# Patient Record
Sex: Female | Born: 1965 | Race: White | Hispanic: No | Marital: Married | Smoking: Never smoker
Health system: Southern US, Community
[De-identification: ages and names within clinical notes are randomized; demographics above are authoritative.]

## PROBLEM LIST (undated history)

## (undated) DIAGNOSIS — Z5189 Encounter for other specified aftercare: Secondary | ICD-10-CM

## (undated) DIAGNOSIS — R51 Headache: Secondary | ICD-10-CM

## (undated) DIAGNOSIS — T7840XA Allergy, unspecified, initial encounter: Secondary | ICD-10-CM

## (undated) DIAGNOSIS — F909 Attention-deficit hyperactivity disorder, unspecified type: Secondary | ICD-10-CM

## (undated) DIAGNOSIS — I82409 Acute embolism and thrombosis of unspecified deep veins of unspecified lower extremity: Secondary | ICD-10-CM

## (undated) DIAGNOSIS — S0300XA Dislocation of jaw, unspecified side, initial encounter: Secondary | ICD-10-CM

## (undated) DIAGNOSIS — R112 Nausea with vomiting, unspecified: Secondary | ICD-10-CM

## (undated) DIAGNOSIS — M1990 Unspecified osteoarthritis, unspecified site: Secondary | ICD-10-CM

## (undated) DIAGNOSIS — Z9889 Other specified postprocedural states: Secondary | ICD-10-CM

## (undated) HISTORY — PX: CHOLECYSTECTOMY: SHX55

## (undated) HISTORY — DX: Unspecified osteoarthritis, unspecified site: M19.90

## (undated) HISTORY — DX: Acute embolism and thrombosis of unspecified deep veins of unspecified lower extremity: I82.409

## (undated) HISTORY — PX: EYE SURGERY: SHX253

## (undated) HISTORY — DX: Allergy, unspecified, initial encounter: T78.40XA

## (undated) HISTORY — DX: Encounter for other specified aftercare: Z51.89

---

## 1997-11-19 HISTORY — PX: OTHER SURGICAL HISTORY: SHX169

## 1997-11-27 DIAGNOSIS — O901 Disruption of perineal obstetric wound: Secondary | ICD-10-CM | POA: Insufficient documentation

## 1997-11-29 HISTORY — PX: OTHER SURGICAL HISTORY: SHX169

## 2011-11-26 ENCOUNTER — Ambulatory Visit (INDEPENDENT_AMBULATORY_CARE_PROVIDER_SITE_OTHER): Payer: 59

## 2011-11-26 DIAGNOSIS — Z719 Counseling, unspecified: Secondary | ICD-10-CM

## 2011-11-26 DIAGNOSIS — F988 Other specified behavioral and emotional disorders with onset usually occurring in childhood and adolescence: Secondary | ICD-10-CM

## 2012-02-25 ENCOUNTER — Ambulatory Visit: Payer: 59

## 2012-02-25 ENCOUNTER — Ambulatory Visit (INDEPENDENT_AMBULATORY_CARE_PROVIDER_SITE_OTHER): Payer: 59 | Admitting: Family Medicine

## 2012-02-25 VITALS — BP 123/84 | HR 78 | Temp 98.2°F | Resp 16 | Ht 60.5 in | Wt 108.0 lb

## 2012-02-25 DIAGNOSIS — F988 Other specified behavioral and emotional disorders with onset usually occurring in childhood and adolescence: Secondary | ICD-10-CM

## 2012-02-25 DIAGNOSIS — F909 Attention-deficit hyperactivity disorder, unspecified type: Secondary | ICD-10-CM

## 2012-02-25 DIAGNOSIS — M5430 Sciatica, unspecified side: Secondary | ICD-10-CM

## 2012-02-25 DIAGNOSIS — M542 Cervicalgia: Secondary | ICD-10-CM

## 2012-02-25 DIAGNOSIS — R634 Abnormal weight loss: Secondary | ICD-10-CM

## 2012-02-25 DIAGNOSIS — Z139 Encounter for screening, unspecified: Secondary | ICD-10-CM

## 2012-02-25 LAB — POCT CBC
Lymph, poc: 2.6 (ref 0.6–3.4)
MCH, POC: 29.3 pg (ref 27–31.2)
MCHC: 32.9 g/dL (ref 31.8–35.4)
MCV: 89 fL (ref 80–97)
MID (cbc): 0.5 (ref 0–0.9)
MPV: 10.1 fL (ref 0–99.8)
POC LYMPH PERCENT: 37.3 %L (ref 10–50)
POC MID %: 7.1 %M (ref 0–12)
Platelet Count, POC: 223 10*3/uL (ref 142–424)
RDW, POC: 13.5 %
WBC: 7.1 10*3/uL (ref 4.6–10.2)

## 2012-02-25 LAB — TSH: TSH: 3.098 u[IU]/mL (ref 0.350–4.500)

## 2012-02-25 MED ORDER — PREDNISONE 20 MG PO TABS: ORAL_TABLET | ORAL | Status: AC

## 2012-02-25 MED ORDER — AMPHETAMINE-DEXTROAMPHETAMINE 20 MG PO TABS: 20.0000 mg | ORAL_TABLET | Freq: Two times a day (BID) | ORAL | Status: DC

## 2012-02-25 MED ORDER — CYCLOBENZAPRINE HCL 10 MG PO TABS: 10.0000 mg | ORAL_TABLET | Freq: Two times a day (BID) | ORAL | Status: AC | PRN

## 2012-02-25 NOTE — Progress Notes (Signed)
Patient Name: Tiffany Parker Date of Birth: Apr 28, 1966 Medical Record Number: [id number] Gender: female Date of Encounter: 02/25/2012  History of Present Illness:  Tiffany Parker is a 46 y.o. very pleasant female patient who presents with the following:  Here to recheck her ADD.  Has been on adderall 20 BID for the last several months. She takes one at 6 or 7 am, the other around 1pm.  Feels that she is losing her concentration in the later afternoon.  She will be finishing her school program but will be doing a lot of studying- math- this summer.  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   She also has had neck problems for several years.  She notes that her neck seems more stiff and sore for the last several months.  She has been diagnosed with degenerative changes but never had an MRI.  Her neck is getting so bad that it makes it difficult for her to turn her head while driving.  She wants to do something about it at this point. Also notes left sciatic nerve pain for the last 6 weeks or so.  Worse with exercise, no numbness or weakness.  Pain occurs in her right buttock and goes into the top part of her right leg  There is no problem list on file for this patient.  Past Medical History  Diagnosis Date  . Allergy   . Blood transfusion    Past Surgical History  Procedure Date  . Cholecystectomy   . Cesarean section   . Episiotomy  11/19/97  . Episiotomy repair 11/1997   History  Substance Use Topics  . Smoking status: Never Smoker   . Smokeless tobacco: Not on file  . Alcohol Use: No   Family History  Problem Relation Age of Onset  . Cancer Mother   . Hypertension Father    Allergies  Allergen Reactions  . Codeine Nausea Only  . Septra (Bactrim) Hives    Medication list has been reviewed and updated.  Review of Systems: As per HPI- otherwise negative.   Physical Examination: Filed Vitals:   02/25/12 0941  BP: 123/84  Pulse: 78  Temp: 98.2 F (36.8 C)    TempSrc: Oral  Resp: 16  Height: 5' 0.5" (1.537 m)  Weight: 108 lb (48.988 kg)    Body mass index is 20.74 kg/(m^2).  GEN: WDWN, NAD, Non-toxic, A & O x 3 HEENT: Atraumatic, Normocephalic. Neck supple. No masses, No LAD.  TM, oropharynx wnl Ears and Nose: No external deformity. CV: RRR, No M/G/R. No JVD. No thrill. No extra heart sounds. PULM: CTA B, no wheezes, crackles, rhonchi. No retractions. No resp. distress. No accessory muscle use. ABD: S, NT, ND, +BS. No rebound. No HSM. EXTR: No c/c/e NEURO Normal gait.  PSYCH: Normally interactive. Conversant. Not depressed or anxious appearing.  Calm demeanor.  Neck: cervical motion is slightly restricted with lateal rotation.  Good extension/ flexion, no bony tenderness.   Straight leg raise on left causes pain to increase in her right buttock, but no radiation of pain or paresthesias.  No weakness or numbness  [facility] reading (PRIMARY) by  Dr. Patsy Lager.  Cervical spine with significant degenerative change especially at C6 to C7 CERVICAL SPINE - COMPLETE 4+ VIEW  Comparison: None.  Findings: No prevertebral soft tissue swelling is evident. There is narrowing of some of the intervertebral disc with most promptly at the level of C6- C7. There is marginal osteophyte formation at multiple  levels representing degenerative spondylosis. There is posterior bony ridging at the level of C6-C7. There is slight posterior subluxation of the body of C4 on the body of C5. No fracture or bony destruction is evident. There is foraminal encroachment by spurring at the level of C6-C7 bilaterally. No cervical ribs were evident.  IMPRESSION: Changes of degenerative disc disease and degenerative spondylosis. Slight posterior subluxation of body of C4 on body of C5. See above for details.  Assessment and Plan: 1. ADD (attention deficit disorder with hyperactivity)  amphetamine-dextroamphetamine (ADDERALL) 20 MG tablet  2. Neck pain  DG Cervical Spine  Complete, predniSONE (DELTASONE) 20 MG tablet, cyclobenzaprine (FLEXERIL) 10 MG tablet  3. Sciatica  predniSONE (DELTASONE) 20 MG tablet, cyclobenzaprine (FLEXERIL) 10 MG tablet  4. Screening  Lipid panel  5. Weight loss  POCT CBC, Comprehensive metabolic panel, TSH   Will continue to use adderall 20 BID- explained that increasing her dose further will probably not be helpful.  Suggest that instead we adjust her dosing schedule- she may take 20 in the morning, 10 at noon and 10 in the early afternoon.  If this is not helpful please let me know.  Otherwise may refill 3 months of medication in July, recheck in 6 months.    Will try a short course of prednisone for neck pain and sciatica.  Also flexeril as needed.  Referral to neurosurgery for her neck  Check labs as above- she is fasting and it has been a year since her last labs Wishes to see Dr. Wynetta Emery for her neck problem

## 2012-02-26 ENCOUNTER — Encounter: Payer: Self-pay | Admitting: Family Medicine

## 2012-02-26 LAB — COMPREHENSIVE METABOLIC PANEL
ALT: 21 U/L (ref 0–35)
AST: 20 U/L (ref 0–37)
BUN: 10 mg/dL (ref 6–23)
Calcium: 9.5 mg/dL (ref 8.4–10.5)
Creat: 0.57 mg/dL (ref 0.50–1.10)
Total Bilirubin: 0.6 mg/dL (ref 0.3–1.2)

## 2012-02-26 LAB — LIPID PANEL
HDL: 81 mg/dL (ref >39)
Total CHOL/HDL Ratio: 2.1 Ratio
VLDL: 10 mg/dL (ref 0–40)

## 2012-03-07 ENCOUNTER — Other Ambulatory Visit (HOSPITAL_COMMUNITY): Payer: Self-pay | Admitting: Neurosurgery

## 2012-03-07 ENCOUNTER — Ambulatory Visit (HOSPITAL_COMMUNITY)
Admission: RE | Admit: 2012-03-07 | Discharge: 2012-03-07 | Disposition: A | Discharge status: Home / Self Care | Payer: 59 | Source: Ambulatory Visit | Attending: Neurosurgery | Admitting: Neurosurgery

## 2012-03-07 DIAGNOSIS — R209 Unspecified disturbances of skin sensation: Secondary | ICD-10-CM | POA: Insufficient documentation

## 2012-03-07 DIAGNOSIS — M47812 Spondylosis without myelopathy or radiculopathy, cervical region: Secondary | ICD-10-CM | POA: Insufficient documentation

## 2012-03-13 ENCOUNTER — Other Ambulatory Visit: Payer: Self-pay | Admitting: Neurosurgery

## 2012-04-24 ENCOUNTER — Encounter (HOSPITAL_COMMUNITY): Payer: Self-pay | Admitting: Pharmacy Technician

## 2012-04-24 ENCOUNTER — Encounter (HOSPITAL_COMMUNITY): Payer: Self-pay | Admitting: *Deleted

## 2012-04-24 NOTE — Pre-Procedure Instructions (Signed)
20 ANDA SOBOTTA  04/24/2012   Your procedure is scheduled on:  July 8th  Report to [facility] at 0530 AM.  Call this number if you have problems the morning of surgery: [phone]   Remember:   Do not eat food or drink:After Midnight.  Take these medicines the morning of surgery with A SIP OF WATER: adderall   Do not wear jewelry, make-up or nail polish.  Do not wear lotions, powders, or perfumes.   Do not shave 48 hours prior to surgery. Men may shave face and neck.  Do not bring valuables to the hospital.  Contacts, dentures or bridgework may not be worn into surgery.  Leave suitcase in the car. After surgery it may be brought to your room.  For patients admitted to the hospital, checkout time is 11:00 AM the day of discharge.   Patients discharged the day of surgery will not be allowed to drive home.  Special Instructions: CHG Shower Use Special Wash: 1/2 bottle night before surgery and 1/2 bottle morning of surgery.   Please read over the following fact sheets that you were given: Pain Booklet, Coughing and Deep Breathing, MRSA Information and Surgical Site Infection Prevention

## 2012-04-28 ENCOUNTER — Encounter (HOSPITAL_COMMUNITY)
Admission: RE | Admit: 2012-04-28 | Discharge: 2012-04-28 | Disposition: A | Discharge status: Home / Self Care | Payer: 59 | Source: Ambulatory Visit | Attending: Neurosurgery | Admitting: Neurosurgery

## 2012-04-28 ENCOUNTER — Encounter (HOSPITAL_COMMUNITY): Payer: Self-pay

## 2012-04-28 ENCOUNTER — Other Ambulatory Visit (HOSPITAL_COMMUNITY): Payer: 59

## 2012-04-28 DIAGNOSIS — I82409 Acute embolism and thrombosis of unspecified deep veins of unspecified lower extremity: Secondary | ICD-10-CM

## 2012-04-28 HISTORY — DX: Acute embolism and thrombosis of unspecified deep veins of unspecified lower extremity: I82.409

## 2012-04-28 HISTORY — DX: Nausea with vomiting, unspecified: R11.2

## 2012-04-28 HISTORY — DX: Other specified postprocedural states: Z98.890

## 2012-04-28 LAB — SURGICAL PCR SCREEN: MRSA, PCR: NEGATIVE

## 2012-04-28 LAB — CBC
HCT: 40.6 % (ref 36.0–46.0)
Hemoglobin: 14 g/dL (ref 12.0–15.0)
MCH: 29.6 pg (ref 26.0–34.0)
MCHC: 34.5 g/dL (ref 30.0–36.0)
MCV: 85.8 fL (ref 78.0–100.0)
RDW: 12.6 % (ref 11.5–15.5)

## 2012-05-04 MED ORDER — CEFAZOLIN SODIUM-DEXTROSE 2-3 GM-% IV SOLR: 2.0000 g | INTRAVENOUS | Status: DC

## 2012-05-05 ENCOUNTER — Ambulatory Visit (HOSPITAL_COMMUNITY): Payer: 59

## 2012-05-05 ENCOUNTER — Ambulatory Visit (HOSPITAL_COMMUNITY)
Admission: RE | Admit: 2012-05-05 | Discharge: 2012-05-06 | Disposition: A | Discharge status: Home / Self Care | Payer: 59 | Source: Ambulatory Visit | Attending: Neurosurgery | Admitting: Neurosurgery

## 2012-05-05 ENCOUNTER — Encounter (HOSPITAL_COMMUNITY): Payer: Self-pay | Admitting: Anesthesiology

## 2012-05-05 ENCOUNTER — Encounter (HOSPITAL_COMMUNITY): Payer: Self-pay | Admitting: Surgery

## 2012-05-05 ENCOUNTER — Ambulatory Visit (HOSPITAL_COMMUNITY): Payer: 59 | Admitting: Anesthesiology

## 2012-05-05 ENCOUNTER — Encounter (HOSPITAL_COMMUNITY)
Admission: RE | Disposition: A | Discharge status: Home / Self Care | Payer: Self-pay | Source: Ambulatory Visit | Attending: Neurosurgery

## 2012-05-05 DIAGNOSIS — Z01812 Encounter for preprocedural laboratory examination: Secondary | ICD-10-CM | POA: Insufficient documentation

## 2012-05-05 DIAGNOSIS — M4712 Other spondylosis with myelopathy, cervical region: Secondary | ICD-10-CM | POA: Insufficient documentation

## 2012-05-05 HISTORY — DX: Dislocation of jaw, unspecified side, initial encounter: S03.00XA

## 2012-05-05 HISTORY — PX: ANTERIOR CERVICAL DECOMP/DISCECTOMY FUSION: SHX1161

## 2012-05-05 HISTORY — DX: Attention-deficit hyperactivity disorder, unspecified type: F90.9

## 2012-05-05 SURGERY — ANTERIOR CERVICAL DECOMPRESSION/DISCECTOMY FUSION 1 LEVEL
Anesthesia: General | Site: Spine Cervical | Wound class: Clean

## 2012-05-05 MED ORDER — OLOPATADINE HCL 0.1 % OP SOLN
1.0000 [drp] | OPHTHALMIC | Status: DC | PRN
  Filled 2012-05-05: qty 5

## 2012-05-05 MED ORDER — ACETAMINOPHEN 325 MG PO TABS: 650.0000 mg | ORAL_TABLET | ORAL | Status: DC | PRN

## 2012-05-05 MED ORDER — THROMBIN 5000 UNITS EX KIT
PACK | CUTANEOUS | Status: DC | PRN
  Administered 2012-05-05 (×2): 5000 [IU] via TOPICAL

## 2012-05-05 MED ORDER — MIDAZOLAM HCL 5 MG/5ML IJ SOLN
INTRAMUSCULAR | Status: DC | PRN
  Administered 2012-05-05: 2 mg via INTRAVENOUS

## 2012-05-05 MED ORDER — HYDROMORPHONE HCL PF 1 MG/ML IJ SOLN
0.5000 mg | INTRAMUSCULAR | Status: DC | PRN
  Administered 2012-05-05: 1 mg via INTRAVENOUS
  Filled 2012-05-05: qty 1

## 2012-05-05 MED ORDER — LACTATED RINGERS IV SOLN
INTRAVENOUS | Status: DC | PRN
  Administered 2012-05-05 (×2): via INTRAVENOUS

## 2012-05-05 MED ORDER — BACITRACIN 50000 UNITS IM SOLR
INTRAMUSCULAR | Status: AC
  Filled 2012-05-05: qty 1

## 2012-05-05 MED ORDER — HYDROMORPHONE HCL PF 1 MG/ML IJ SOLN
0.2500 mg | INTRAMUSCULAR | Status: DC | PRN
  Administered 2012-05-05 (×3): 0.5 mg via INTRAVENOUS

## 2012-05-05 MED ORDER — SODIUM CHLORIDE 0.9 % IV SOLN
250.0000 mL | INTRAVENOUS | Status: DC
  Administered 2012-05-05 (×2): 250 mL via INTRAVENOUS

## 2012-05-05 MED ORDER — THROMBIN 5000 UNITS EX SOLR
CUTANEOUS | Status: DC | PRN
  Administered 2012-05-05: 09:00:00 via TOPICAL

## 2012-05-05 MED ORDER — 0.9 % SODIUM CHLORIDE (POUR BTL) OPTIME
TOPICAL | Status: DC | PRN
  Administered 2012-05-05: 1000 mL

## 2012-05-05 MED ORDER — DEXAMETHASONE SODIUM PHOSPHATE 10 MG/ML IJ SOLN
INTRAMUSCULAR | Status: DC | PRN
  Administered 2012-05-05: 10 mg via INTRAVENOUS

## 2012-05-05 MED ORDER — OLOPATADINE HCL 0.2 % OP SOLN: 1.0000 [drp] | OPHTHALMIC | Status: DC | PRN

## 2012-05-05 MED ORDER — POLYETHYLENE GLYCOL 3350 17 G PO PACK
17.0000 g | PACK | Freq: Every day | ORAL | Status: DC
  Filled 2012-05-05 (×2): qty 1

## 2012-05-05 MED ORDER — SODIUM CHLORIDE 0.9 % IV SOLN
INTRAVENOUS | Status: AC
  Filled 2012-05-05: qty 500

## 2012-05-05 MED ORDER — OXYCODONE-ACETAMINOPHEN 5-325 MG PO TABS
2.0000 | ORAL_TABLET | ORAL | Status: DC | PRN
  Administered 2012-05-05: 1 via ORAL
  Administered 2012-05-05 – 2012-05-06 (×2): 2 via ORAL
  Administered 2012-05-06: 1 via ORAL
  Filled 2012-05-05: qty 1
  Filled 2012-05-05: qty 2
  Filled 2012-05-05: qty 1
  Filled 2012-05-05: qty 2

## 2012-05-05 MED ORDER — LIDOCAINE HCL (CARDIAC) 20 MG/ML IV SOLN
INTRAVENOUS | Status: DC | PRN
  Administered 2012-05-05: 40 mg via INTRAVENOUS

## 2012-05-05 MED ORDER — SODIUM CHLORIDE 0.9 % IJ SOLN
3.0000 mL | Freq: Two times a day (BID) | INTRAMUSCULAR | Status: DC
  Administered 2012-05-05: 3 mL via INTRAVENOUS

## 2012-05-05 MED ORDER — ONDANSETRON HCL 4 MG/2ML IJ SOLN
INTRAMUSCULAR | Status: DC | PRN
  Administered 2012-05-05: 4 mg via INTRAVENOUS

## 2012-05-05 MED ORDER — ONDANSETRON HCL 4 MG/2ML IJ SOLN
INTRAMUSCULAR | Status: AC
  Filled 2012-05-05: qty 2

## 2012-05-05 MED ORDER — BACITRACIN 50000 UNITS IM SOLR
INTRAMUSCULAR | Status: DC | PRN
  Administered 2012-05-05: 09:00:00

## 2012-05-05 MED ORDER — HYDROMORPHONE HCL PF 1 MG/ML IJ SOLN
INTRAMUSCULAR | Status: AC
  Filled 2012-05-05: qty 1

## 2012-05-05 MED ORDER — CYCLOBENZAPRINE HCL 10 MG PO TABS
10.0000 mg | ORAL_TABLET | Freq: Three times a day (TID) | ORAL | Status: DC | PRN
  Administered 2012-05-05 – 2012-05-06 (×2): 10 mg via ORAL
  Filled 2012-05-05 (×2): qty 1

## 2012-05-05 MED ORDER — CEFAZOLIN SODIUM-DEXTROSE 2-3 GM-% IV SOLR
INTRAVENOUS | Status: AC
  Administered 2012-05-05: 2 g via INTRAVENOUS
  Filled 2012-05-05: qty 50

## 2012-05-05 MED ORDER — HEMOSTATIC AGENTS (NO CHARGE) OPTIME
TOPICAL | Status: DC | PRN
  Administered 2012-05-05: 1 via TOPICAL

## 2012-05-05 MED ORDER — NEOSTIGMINE METHYLSULFATE 1 MG/ML IJ SOLN
INTRAMUSCULAR | Status: DC | PRN
  Administered 2012-05-05: 3 mg via INTRAVENOUS

## 2012-05-05 MED ORDER — ALUM & MAG HYDROXIDE-SIMETH 200-200-20 MG/5ML PO SUSP: 30.0000 mL | Freq: Four times a day (QID) | ORAL | Status: DC | PRN

## 2012-05-05 MED ORDER — GLYCOPYRROLATE 0.2 MG/ML IJ SOLN
INTRAMUSCULAR | Status: DC | PRN
  Administered 2012-05-05: .6 mg via INTRAVENOUS

## 2012-05-05 MED ORDER — CEFAZOLIN SODIUM 1-5 GM-% IV SOLN
1.0000 g | Freq: Three times a day (TID) | INTRAVENOUS | Status: AC
  Administered 2012-05-05 (×2): 1 g via INTRAVENOUS
  Filled 2012-05-05 (×2): qty 50

## 2012-05-05 MED ORDER — ROCURONIUM BROMIDE 100 MG/10ML IV SOLN
INTRAVENOUS | Status: DC | PRN
  Administered 2012-05-05: 40 mg via INTRAVENOUS
  Administered 2012-05-05: 10 mg via INTRAVENOUS

## 2012-05-05 MED ORDER — PHENOL 1.4 % MT LIQD
1.0000 | OROMUCOSAL | Status: DC | PRN
  Administered 2012-05-05: 1 via OROMUCOSAL
  Filled 2012-05-05: qty 177

## 2012-05-05 MED ORDER — ONDANSETRON HCL 4 MG/2ML IJ SOLN: 4.0000 mg | Freq: Once | INTRAMUSCULAR | Status: DC | PRN

## 2012-05-05 MED ORDER — ONDANSETRON HCL 4 MG/2ML IJ SOLN
4.0000 mg | INTRAMUSCULAR | Status: DC | PRN
  Administered 2012-05-05: 4 mg via INTRAVENOUS
  Filled 2012-05-05: qty 2

## 2012-05-05 MED ORDER — PROPOFOL 10 MG/ML IV BOLUS
INTRAVENOUS | Status: DC | PRN
  Administered 2012-05-05: 130 mg via INTRAVENOUS

## 2012-05-05 MED ORDER — FENTANYL CITRATE 0.05 MG/ML IJ SOLN
INTRAMUSCULAR | Status: DC | PRN
  Administered 2012-05-05: 150 ug via INTRAVENOUS

## 2012-05-05 MED ORDER — PROMETHAZINE HCL 25 MG/ML IJ SOLN
12.5000 mg | Freq: Four times a day (QID) | INTRAMUSCULAR | Status: DC | PRN
  Administered 2012-05-05: 12.5 mg via INTRAVENOUS
  Filled 2012-05-05: qty 1

## 2012-05-05 MED ORDER — ACETAMINOPHEN 10 MG/ML IV SOLN: 1000.0000 mg | Freq: Once | INTRAVENOUS | Status: DC | PRN

## 2012-05-05 MED ORDER — SODIUM CHLORIDE 0.9 % IJ SOLN: 3.0000 mL | INTRAMUSCULAR | Status: DC | PRN

## 2012-05-05 MED ORDER — AMPHETAMINE-DEXTROAMPHETAMINE 10 MG PO TABS
20.0000 mg | ORAL_TABLET | ORAL | Status: DC
  Administered 2012-05-06: 20 mg via ORAL
  Filled 2012-05-05 (×2): qty 1

## 2012-05-05 MED ORDER — ONDANSETRON HCL 4 MG/2ML IJ SOLN
INTRAMUSCULAR | Status: AC
  Administered 2012-05-05: 4 mg
  Filled 2012-05-05: qty 2

## 2012-05-05 MED ORDER — ACETAMINOPHEN 650 MG RE SUPP: 650.0000 mg | RECTAL | Status: DC | PRN

## 2012-05-05 MED ORDER — AMPHETAMINE-DEXTROAMPHETAMINE 20 MG PO TABS: 20.0000 mg | ORAL_TABLET | Freq: Two times a day (BID) | ORAL | Status: DC

## 2012-05-05 SURGICAL SUPPLY — 63 items
BAG DECANTER FOR FLEXI CONT (MISCELLANEOUS) ×2 IMPLANT
BENZOIN TINCTURE PRP APPL 2/3 (GAUZE/BANDAGES/DRESSINGS) ×2 IMPLANT
BIT DRILL SM SPINE QC 12 (BIT) ×2 IMPLANT
BRUSH SCRUB EZ PLAIN DRY (MISCELLANEOUS) ×2 IMPLANT
BUR MATCHSTICK NEURO 3.0 LAGG (BURR) ×2 IMPLANT
CANISTER SUCTION 2500CC (MISCELLANEOUS) ×2 IMPLANT
CLOTH BEACON ORANGE TIMEOUT ST (SAFETY) ×2 IMPLANT
CONT SPEC 4OZ CLIKSEAL STRL BL (MISCELLANEOUS) ×2 IMPLANT
DERMABOND ADHESIVE PROPEN (GAUZE/BANDAGES/DRESSINGS) ×1
DERMABOND ADVANCED (GAUZE/BANDAGES/DRESSINGS)
DERMABOND ADVANCED .7 DNX12 (GAUZE/BANDAGES/DRESSINGS) IMPLANT
DERMABOND ADVANCED .7 DNX6 (GAUZE/BANDAGES/DRESSINGS) ×1 IMPLANT
DRAPE C-ARM 42X72 X-RAY (DRAPES) ×4 IMPLANT
DRAPE LAPAROTOMY 100X72 PEDS (DRAPES) ×2 IMPLANT
DRAPE MICROSCOPE ZEISS OPMI (DRAPES) ×2 IMPLANT
DRAPE POUCH INSTRU U-SHP 10X18 (DRAPES) ×2 IMPLANT
DRSG OPSITE 4X5.5 SM (GAUZE/BANDAGES/DRESSINGS) ×2 IMPLANT
ELECT COATED BLADE 2.86 ST (ELECTRODE) ×2 IMPLANT
ELECT REM PT RETURN 9FT ADLT (ELECTROSURGICAL) ×2
ELECTRODE REM PT RTRN 9FT ADLT (ELECTROSURGICAL) ×1 IMPLANT
GAUZE SPONGE 2X2 8PLY STRL LF (GAUZE/BANDAGES/DRESSINGS) ×1 IMPLANT
GAUZE SPONGE 4X4 16PLY XRAY LF (GAUZE/BANDAGES/DRESSINGS) IMPLANT
GLOVE BIO SURGEON STRL SZ8 (GLOVE) ×2 IMPLANT
GLOVE BIOGEL PI IND STRL 7.0 (GLOVE) ×3 IMPLANT
GLOVE BIOGEL PI INDICATOR 7.0 (GLOVE) ×3
GLOVE ECLIPSE 8.5 STRL (GLOVE) ×2 IMPLANT
GLOVE EXAM NITRILE LRG STRL (GLOVE) IMPLANT
GLOVE EXAM NITRILE MD LF STRL (GLOVE) IMPLANT
GLOVE EXAM NITRILE XL STR (GLOVE) IMPLANT
GLOVE EXAM NITRILE XS STR PU (GLOVE) IMPLANT
GLOVE INDICATOR 8.5 STRL (GLOVE) ×2 IMPLANT
GLOVE SURG SS PI 6.5 STRL IVOR (GLOVE) ×6 IMPLANT
GOWN BRE IMP SLV AUR LG STRL (GOWN DISPOSABLE) ×4 IMPLANT
GOWN BRE IMP SLV AUR XL STRL (GOWN DISPOSABLE) ×4 IMPLANT
GOWN STRL REIN 2XL LVL4 (GOWN DISPOSABLE) IMPLANT
HEAD HALTER (SOFTGOODS) ×2 IMPLANT
HEMOSTAT POWDER KIT SURGIFOAM (HEMOSTASIS) IMPLANT
HEMOSTAT POWDER SURGIFOAM 1G (HEMOSTASIS) ×2 IMPLANT
KIT BASIN OR (CUSTOM PROCEDURE TRAY) ×2 IMPLANT
KIT ROOM TURNOVER OR (KITS) ×2 IMPLANT
NEEDLE HYPO 18GX1.5 BLUNT FILL (NEEDLE) IMPLANT
NEEDLE SPNL 20GX3.5 QUINCKE YW (NEEDLE) ×2 IMPLANT
NS IRRIG 1000ML POUR BTL (IV SOLUTION) ×2 IMPLANT
PACK LAMINECTOMY NEURO (CUSTOM PROCEDURE TRAY) ×2 IMPLANT
PAD ARMBOARD 7.5X6 YLW CONV (MISCELLANEOUS) ×6 IMPLANT
PLATE ANT CERV XTEND 1 LV 14 (Plate) ×2 IMPLANT
PUTTY BONE DBX 2.5 MIS (Bone Implant) ×2 IMPLANT
RUBBERBAND STERILE (MISCELLANEOUS) ×4 IMPLANT
SCREW XTD VAR 4.2 SELF TAP 12 (Screw) ×8 IMPLANT
SPACER COLONIAL LGE 8MM 7DEG (Spacer) ×2 IMPLANT
SPONGE GAUZE 2X2 STER 10/PKG (GAUZE/BANDAGES/DRESSINGS) ×1
SPONGE GAUZE 4X4 12PLY (GAUZE/BANDAGES/DRESSINGS) ×2 IMPLANT
SPONGE INTESTINAL PEANUT (DISPOSABLE) ×2 IMPLANT
SPONGE SURGIFOAM ABS GEL SZ50 (HEMOSTASIS) ×2 IMPLANT
STRIP CLOSURE SKIN 1/2X4 (GAUZE/BANDAGES/DRESSINGS) ×2 IMPLANT
SUT VIC AB 3-0 SH 8-18 (SUTURE) ×2 IMPLANT
SUT VICRYL 4-0 PS2 18IN ABS (SUTURE) ×2 IMPLANT
SYR 20ML ECCENTRIC (SYRINGE) ×2 IMPLANT
TAPE CLOTH 4X10 WHT NS (GAUZE/BANDAGES/DRESSINGS) IMPLANT
TOWEL OR 17X24 6PK STRL BLUE (TOWEL DISPOSABLE) ×2 IMPLANT
TOWEL OR 17X26 10 PK STRL BLUE (TOWEL DISPOSABLE) ×2 IMPLANT
TRAP SPECIMEN MUCOUS 40CC (MISCELLANEOUS) ×2 IMPLANT
WATER STERILE IRR 1000ML POUR (IV SOLUTION) ×2 IMPLANT

## 2012-05-05 NOTE — Anesthesia Postprocedure Evaluation (Signed)
  Anesthesia Post-op Note  Patient: Tiffany Parker  Procedure(s) Performed: Procedure(s) (LRB): ANTERIOR CERVICAL DECOMPRESSION/DISCECTOMY FUSION 1 LEVEL (N/A)  Patient Location: PACU  Anesthesia Type: General  Level of Consciousness: awake, alert  and oriented  Airway and Oxygen Therapy: Patient Spontanous Breathing  Post-op Pain: mild  Post-op Assessment: Post-op Vital signs reviewed, Patient's Cardiovascular Status Stable, RESPIRATORY FUNCTION UNSTABLE, Patent Airway and Pain level controlled  Post-op Vital Signs: stable  Complications: No apparent anesthesia complications

## 2012-05-05 NOTE — Anesthesia Procedure Notes (Signed)
Procedure Name: Intubation Date/Time: 05/05/2012 7:45 AM Performed by: Rogelia Boga Pre-anesthesia Checklist: Patient identified, Emergency Drugs available, Suction available, Patient being monitored and Timeout performed Patient Re-evaluated:Patient Re-evaluated prior to inductionOxygen Delivery Method: Circle system utilized Preoxygenation: Pre-oxygenation with 100% oxygen Intubation Type: IV induction Ventilation: Mask ventilation without difficulty Laryngoscope Size: Mac and 4 Grade View: Grade I Tube type: Oral Tube size: 7.0 mm Number of attempts: 1 Airway Equipment and Method: Stylet Secured at: 20 cm Tube secured with: Tape Dental Injury: Teeth and Oropharynx as per pre-operative assessment

## 2012-05-05 NOTE — Progress Notes (Signed)
Orthopedic Tech Progress Note Patient Details:  Tiffany Parker 1966-09-01 [id number]  Ortho Devices Type of Ortho Device: Soft collar Ortho Device/Splint Interventions: Ordered   Shawnie Pons 05/05/2012, 9:51 AM

## 2012-05-05 NOTE — Op Note (Signed)
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  EBL: Minimal  History of present illness: Patient is a very pleasant 46 year old female is a progress worsening neck and bilateral shoulder pain worse on the right with numbness tingling her hands weakness in her hands. Critical exam was consistent with developing myelopathy as well as imaging studies shows severe spinal cord compression from a disc herniation spondylosis at C4-5. Due to patient's failure conservative treatment imaging findings and progression of clinical syndrome patient recommended enter cervicectomy fusion with a wrist nevus of the operation with her she is doing agreed to proceed forward including perioperative course and expectations of outcome and alternatives to surgery.  Operative procedure: Patient brought into the or was induced under general anesthesia positioned supine the neck in slight extension in 5 pounds of halter traction the right upper  And draped in routine sterile fashion. Preoperative x-ray localize the appropriate level so a curvilinear incision was made just off midline to aid report of the sternomastoid and the superficial layer of the platysma was divided longitudinally and the avascular tissue sternomastoid and strap muscles was developed down to the prevertebral fascia precautions dissected with Kitners. Her operative x-ray identified the appropriate level. Large antrostomy to did not Leksell rongeur the disc space was incised the space was cleaned out anteriorly with pituitary rongeurs and BA curette a 3 mm Kerrison punch was used to bite off further of  the anterior osteophyte. Then using a high-speed drill capturing the bone shavings in a mucous trap the disc space is drilled down the posterior annulus and osteophytic complex. Then under microscopic illumination, aggressive abutting both endplates was carried out identifying the posterior longitudinal ligament which was divided in piecemeal fashion and removed exposing the thecal sac. A large forms of this it migrated subligamentous as well as large posterior bone spurs coming off the C4 vertebral body. All aggressively under been decompress the central canal March across laterally uncinate hypertrophy was causing proximal stenosis C5 nerve root this is all aggressively under been decompress the C5 nerve root skeletonized flush with pedicle. At the end of the foraminotomies each foramen easily to nerve root confirming the stenosis and the C5 nerve root aggressive abutting both endplates further decompress the central canal. After adequate decompression achieved scopes irrigated meticulous hemostasis was maintained the endplates were scraped. CT cage the cages packed with local are graft harvested from the joint the osteophytes mixed with DBX and inserted into a 8 mm peek cage was inserted 1 mm deep to the anterior vertebra on the 40 mm plate was placed screws excellent purchase locking mechanisms were engaged and was copious irrigated meticulous in space was maintained and was closed in layers with after Vicryl and platysma and a running 4 subcuticular at the end of case all needle counts sponge counts were correct per the nurses. The wound was dressed with Dermabond benzo and Steri-Strips.

## 2012-05-05 NOTE — Anesthesia Preprocedure Evaluation (Addendum)
Anesthesia Evaluation  Patient identified by MRN, date of birth, ID band Patient awake    Reviewed: Allergy & Precautions, H&P , NPO status   History of Anesthesia Complications (+) PONV  Airway Mallampati: II TM Distance: >3 FB Neck ROM: Full    Dental  (+) Teeth Intact and Dental Advisory Given   Pulmonary  breath sounds clear to auscultation        Cardiovascular Rhythm:Regular Rate:Normal     Neuro/Psych    GI/Hepatic   Endo/Other    Renal/GU      Musculoskeletal   Abdominal   Peds  Hematology   Anesthesia Other Findings ADHD  Reproductive/Obstetrics                          Anesthesia Physical Anesthesia Plan  ASA: II  Anesthesia Plan: General   Post-op Pain Management:    Induction: Intravenous  Airway Management Planned: Oral ETT  Additional Equipment:   Intra-op Plan:   Post-operative Plan: Extubation in OR  Informed Consent: I have reviewed the patients History and Physical, chart, labs and discussed the procedure including the risks, benefits and alternatives for the proposed anesthesia with the patient or authorized representative who has indicated his/her understanding and acceptance.   Dental advisory given  Plan Discussed with: CRNA, Surgeon and Anesthesiologist  Anesthesia Plan Comments: (HNP C4-5 H/O Post-op nausea/vomiting  Kipp Brood, MD)       Anesthesia Quick Evaluation

## 2012-05-05 NOTE — Transfer of Care (Signed)
Immediate Anesthesia Transfer of Care Note  Patient: Tiffany Parker  Procedure(s) Performed: Procedure(s) (LRB): ANTERIOR CERVICAL DECOMPRESSION/DISCECTOMY FUSION 1 LEVEL (N/A)  Patient Location: PACU  Anesthesia Type: General  Level of Consciousness: awake, alert , oriented and patient cooperative  Airway & Oxygen Therapy: Patient Spontanous Breathing and Patient connected to nasal cannula oxygen  Post-op Assessment: Report given to PACU RN, Post -op Vital signs reviewed and stable and Patient moving all extremities X 4  Post vital signs: Reviewed and stable  Complications: No apparent anesthesia complications

## 2012-05-05 NOTE — Anesthesia Postprocedure Evaluation (Signed)
  Anesthesia Post-op Note  Patient: Tiffany Parker  Procedure(s) Performed: Procedure(s) (LRB): ANTERIOR CERVICAL DECOMPRESSION/DISCECTOMY FUSION 1 LEVEL (N/A)  Patient Location: PACU  Anesthesia Type: General  Level of Consciousness: alert , oriented, sedated, patient cooperative and responds to stimulation  Airway and Oxygen Therapy: Patient Spontanous Breathing and Patient connected to nasal cannula oxygen  Post-op Pain: mild  Post-op Assessment: Post-op Vital signs reviewed, Patient's Cardiovascular Status Stable, Respiratory Function Stable, Patent Airway, No signs of Nausea or vomiting, Adequate PO intake, Pain level controlled, No residual numbness and No residual motor weakness  Post-op Vital Signs: Reviewed and stable  Complications: No apparent anesthesia complications

## 2012-05-05 NOTE — H&P (Signed)
Tiffany Parker is an 46 y.o. female.   Chief Complaint: Neck pain interscapular pain and numbness in her hands HPI: Patient is a very pleasant [age] female has had progressive worsening neck pain and pain radiating to both scapula a worse in the right and numbness and tingling in In her hands weakness and intermittent weakness in her hands. She denies any true radiating pain down to her arm in her fingers. Workup has revealed severe spinal cord compression at C4-5 the large disc herniation at this level to the patient takes her treatment her clinical history of a developing myelopathy and clinical exam consistent with cervical spondylitic myelopathy I recommended anterior cervical discectomy and fusion. I have extensively reviewed the risks benefits of the operation perioperative course and expectations of outcome and alternatives to surgery she understands and agrees to proceed forward.  Past Medical History  Diagnosis Date  . Allergy   . Blood transfusion   . ADHD (attention deficit hyperactivity disorder)   . TMJ (dislocation of temporomandibular joint)   . PONV (postoperative nausea and vomiting)     Past Surgical History  Procedure Date  . Cholecystectomy   . Cesarean section   . Episiotomy  11/19/97  . Episiotomy repair 11/1997  . Eye surgery     Family History  Problem Relation Age of Onset  . Cancer Mother   . Hypertension Father    Social History:  reports that she has never smoked. She does not have any smokeless tobacco history on file. She reports that she does not drink alcohol. Her drug history not on file.  Allergies:  Allergies  Allergen Reactions  . Codeine Nausea And Vomiting  . Prenatal Vitamins Hives    Patient does not remember the brand of the prenatal vitamin that caused her to have issues.   Doylene Bode (Bactrim) Hives    Medications Prior to Admission  Medication Sig Dispense Refill  . acetaminophen (TYLENOL) 500 MG tablet Take 1,000 mg by mouth as  needed. For pain/headaches.      . Adapalene-Benzoyl Peroxide (EPIDUO) 0.1-2.5 % gel Apply 1 application topically at bedtime.      Marland Kitchen amphetamine-dextroamphetamine (ADDERALL) 20 MG tablet Take 20 mg by mouth 2 (two) times daily.      Marland Kitchen aspirin-acetaminophen-caffeine (EXCEDRIN MIGRAINE) 250-250-65 MG per tablet Take 1 tablet by mouth every 6 (six) hours as needed. For migraine.      . Calcium Carbonate-Vitamin D (CALCIUM 600+D) 600-400 MG-UNIT per tablet Take 1 tablet by mouth 2 (two) times daily.      Marland Kitchen Co-Enzyme Q-10 100 MG CAPS Take 300 mg by mouth daily.      . Dapsone 5 % topical gel Apply 1 application topically 2 (two) times daily.      Marland Kitchen doxycycline (ORACEA) 40 MG capsule Take 40 mg by mouth every morning.      . Flaxseed, Linseed, 1300 MG CAPS Take 1 capsule by mouth 2 (two) times daily.      Marland Kitchen ibuprofen (ADVIL,MOTRIN) 200 MG tablet Take 200-800 mg by mouth as needed. For pain/headaches.      . mometasone (NASONEX) 50 MCG/ACT nasal spray Place 2 sprays into the nose daily.      . Multiple Vitamin (MULTIVITAMIN WITH MINERALS) TABS Take 1 tablet by mouth daily.      . Olopatadine HCl (PATADAY) 0.2 % SOLN Place 1 drop into both eyes as needed. For dry/itchy eyes or allergies.      . Omega-3 Fatty Acids (  FISH OIL) 1200 MG CAPS Take 1 capsule by mouth daily.      . polyethylene glycol (MIRALAX / GLYCOLAX) packet Take 17 g by mouth daily.        No results found for this or any previous visit (from the past 48 hour(s)). No results found.  Review of Systems  Constitutional: Negative.   HENT: Positive for neck pain.   Eyes: Negative.   Respiratory: Negative.   Cardiovascular: Negative.   Gastrointestinal: Negative.   Genitourinary: Negative.   Musculoskeletal: Positive for myalgias.  Skin: Negative.   Neurological: Positive for tingling.  Endo/Heme/Allergies: Negative.   Psychiatric/Behavioral: Negative.     Blood pressure 122/84, pulse 81, temperature 97.8 F (36.6 C),  temperature source Oral, resp. rate 18, last menstrual period 04/29/2012, SpO2 100.00%. Physical Exam  Constitutional: She is oriented to person, place, and time. She appears well-developed.  HENT:  Head: Normocephalic.  Eyes: Pupils are equal, round, and reactive to light.  GI: Soft.  Neurological: She is alert and oriented to person, place, and time. She has normal strength. GCS eye subscore is 4. GCS verbal subscore is 5. GCS motor subscore is 6.       Patient is awake alert oriented strength is 5 out of 5 in her biceps triceps deltoid wrist flexion extension and intrinsics. Neck is slightly limited range of motion     Assessment/Plan 45 year old female presents for an ACDF at C4-5.  Perrin Gens P 05/05/2012, 7:18 AM

## 2012-05-05 NOTE — Preoperative (Signed)
Beta Blockers   Reason not to administer Beta Blockers:Not Applicable 

## 2012-05-06 ENCOUNTER — Encounter (HOSPITAL_COMMUNITY): Payer: Self-pay | Admitting: Neurosurgery

## 2012-05-06 MED ORDER — CYCLOBENZAPRINE HCL 10 MG PO TABS: 10.0000 mg | ORAL_TABLET | Freq: Three times a day (TID) | ORAL | Status: AC | PRN

## 2012-05-06 MED ORDER — OXYCODONE-ACETAMINOPHEN 5-325 MG PO TABS: 2.0000 | ORAL_TABLET | ORAL | Status: AC | PRN

## 2012-05-06 NOTE — Progress Notes (Signed)
Pt. Tolerated procedure well. Pt. Alert and oriented,follows simple instructions, denies pain and nausea. Incision area without swelling, redness or S/S of infection. Voiding adequate clear yellow urine. Moving all extremities well and vitals stable and documented. Anterior Cervical surgery instructions given to patient and family member for home safety and precautions.Pt and family stated understanding of instructions given.

## 2012-05-06 NOTE — Progress Notes (Signed)
Subjective: Patient reports She's feeling well the soreness is improved muscle is improved no numbness or tingling arms or hands  Objective: Vital signs in last 24 hours: Temp:  [97.2 F (36.2 C)-99.6 F (37.6 C)] 99.6 F (37.6 C) (07/09 0400) Pulse Rate:  [58-87] 87  (07/09 0400) Resp:  [14-51] 14  (07/09 0400) BP: (93-129)/(57-77) 93/57 mmHg (07/09 0400) SpO2:  [95 %-100 %] 99 % (07/09 0400)  Intake/Output from previous day: 07/08 0701 - 07/09 0700 In: 2380 [P.O.:1080; I.V.:1300] Out: 50 [Blood:50] Intake/Output this shift:    Strength out of 5 wound clean and dry  Lab Results: No results found for this basename: WBC:2,HGB:2,HCT:2,PLT:2 in the last 72 hours BMET No results found for this basename: NA:2,K:2,CL:2,CO2:2,GLUCOSE:2,BUN:2,CREATININE:2,CALCIUM:2 in the last 72 hours  Studies/Results: Dg Cervical Spine 1 View  05/05/2012  *RADIOLOGY REPORT*  Clinical Data: C4-5 fusion.  DG C-ARM 1-60 MIN,DG CERVICAL SPINE - 1 VIEW  Technique: Single fluoroscopic intraoperative spot view of the cervical spine in the lateral projection is provided.  Comparison:  MRI cervical spine 03/07/2012.  Findings: Anterior plate and screws and interbody spacer are in place at C4-5.  Vertebral body height and alignment are maintained.  IMPRESSION: C4-5 ACDF.  Original Report Authenticated By: Bernadene Bell. D'ALESSIO, M.D.   Dg C-arm 1-60 Min  05/05/2012  *RADIOLOGY REPORT*  Clinical Data: C4-5 fusion.  DG C-ARM 1-60 MIN,DG CERVICAL SPINE - 1 VIEW  Technique: Single fluoroscopic intraoperative spot view of the cervical spine in the lateral projection is provided.  Comparison:  MRI cervical spine 03/07/2012.  Findings: Anterior plate and screws and interbody spacer are in place at C4-5.  Vertebral body height and alignment are maintained.  IMPRESSION: C4-5 ACDF.  Original Report Authenticated By: Bernadene Bell. Maricela Curet, M.D.    Assessment/Plan: Discharge home  LOS: 1 day     Lilyona Richner P 05/06/2012, 6:39  AM

## 2012-05-06 NOTE — Discharge Summary (Signed)
Physician Discharge Summary  Patient ID: Tiffany Parker MRN: [id number] DOB/AGE: 02/14/1966 46 y.o.  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 *    Discharged Condition: good  Hospital Course: Patient is admitted hospital underwent an ACDF at C4-5 postoperative very well with recovered in the floor on the floor she's convalescing well ambulating and voiding spontaneously tolerating a regular diet wound is clean and dry he'll be discharged home posterior day 1 the  Consults: Significant Diagnostic Studies: Treatments: ACDF C4-5 Discharge Exam: Blood pressure 93/57, pulse 87, temperature 99.6 F (37.6 C), temperature source Oral, resp. rate 14, last menstrual period 04/29/2012, SpO2 99.00%. Strength out of 5 wound clean and dry  Disposition: Home   Medication List  As of 05/06/2012  6:38 AM   TAKE these medications         acetaminophen 500 MG tablet   Commonly known as: TYLENOL   Take 1,000 mg by mouth as needed. For pain/headaches.      amphetamine-dextroamphetamine 20 MG tablet   Commonly known as: ADDERALL   Take 20 mg by mouth 2 (two) times daily.      aspirin-acetaminophen-caffeine 250-250-65 MG per tablet   Commonly known as: EXCEDRIN MIGRAINE   Take 1 tablet by mouth every 6 (six) hours as needed. For migraine.      Calcium 600+D 600-400 MG-UNIT per tablet   Generic drug: Calcium Carbonate-Vitamin D   Take 1 tablet by mouth 2 (two) times daily.      Co-Enzyme Q-10 100 MG Caps   Take 300 mg by mouth daily.      cyclobenzaprine 10 MG tablet   Commonly known as: FLEXERIL   Take 1 tablet (10 mg total) by mouth 3 (three) times daily as needed for muscle spasms.      Dapsone 5 % topical gel   Apply 1 application topically 2 (two) times daily.      doxycycline 40 MG capsule   Commonly known as:  ORACEA   Take 40 mg by mouth every morning.      EPIDUO 0.1-2.5 % gel   Generic drug: Adapalene-Benzoyl Peroxide   Apply 1 application topically at bedtime.      Fish Oil 1200 MG Caps   Take 1 capsule by mouth daily.      Flaxseed (Linseed) 1300 MG Caps   Take 1 capsule by mouth 2 (two) times daily.      ibuprofen 200 MG tablet   Commonly known as: ADVIL,MOTRIN   Take 200-800 mg by mouth as needed. For pain/headaches.      mometasone 50 MCG/ACT nasal spray   Commonly known as: NASONEX   Place 2 sprays into the nose daily.      multivitamin with minerals Tabs   Take 1 tablet by mouth daily.      oxyCODONE-acetaminophen 5-325 MG per tablet   Commonly known as: PERCOCET   Take 2 tablets by mouth every 4 (four) hours as needed.      PATADAY 0.2 % Soln   Generic drug: Olopatadine HCl   Place 1 drop into both eyes as needed. For dry/itchy eyes or allergies.      polyethylene glycol packet   Commonly known as: MIRALAX / GLYCOLAX   Take 17 g by mouth daily.             Signed: [name] P 05/06/2012,  6:38 AM

## 2012-05-06 NOTE — Plan of Care (Signed)
Problem: Consults Goal: Diagnosis - Spinal Surgery Cervical Spine Fusion Anterior cervical discectomy and fusion at C4-5

## 2012-05-07 DIAGNOSIS — I82409 Acute embolism and thrombosis of unspecified deep veins of unspecified lower extremity: Secondary | ICD-10-CM | POA: Insufficient documentation

## 2012-05-12 ENCOUNTER — Encounter (INDEPENDENT_AMBULATORY_CARE_PROVIDER_SITE_OTHER): Payer: 59 | Admitting: Vascular Surgery

## 2012-05-12 ENCOUNTER — Ambulatory Visit (INDEPENDENT_AMBULATORY_CARE_PROVIDER_SITE_OTHER): Payer: 59 | Admitting: Family Medicine

## 2012-05-12 VITALS — BP 118/88 | HR 100 | Temp 98.4°F | Resp 18 | Ht 60.5 in | Wt 105.0 lb

## 2012-05-12 DIAGNOSIS — I82409 Acute embolism and thrombosis of unspecified deep veins of unspecified lower extremity: Secondary | ICD-10-CM

## 2012-05-12 DIAGNOSIS — F988 Other specified behavioral and emotional disorders with onset usually occurring in childhood and adolescence: Secondary | ICD-10-CM

## 2012-05-12 DIAGNOSIS — M79609 Pain in unspecified limb: Secondary | ICD-10-CM

## 2012-05-12 DIAGNOSIS — I82401 Acute embolism and thrombosis of unspecified deep veins of right lower extremity: Secondary | ICD-10-CM

## 2012-05-12 LAB — BASIC METABOLIC PANEL
CO2: 29 mEq/L (ref 19–32)
Calcium: 10 mg/dL (ref 8.4–10.5)
Sodium: 137 mEq/L (ref 135–145)

## 2012-05-12 MED ORDER — AMPHETAMINE-DEXTROAMPHETAMINE 20 MG PO TABS: 20.0000 mg | ORAL_TABLET | Freq: Two times a day (BID) | ORAL | Status: DC

## 2012-05-12 MED ORDER — RIVAROXABAN 15 MG PO TABS: 15.0000 mg | ORAL_TABLET | Freq: Two times a day (BID) | ORAL | Status: DC

## 2012-05-12 NOTE — Progress Notes (Addendum)
Date:  05/12/2012   Name:  Tiffany Parker   DOB:  01-04-1966   MRN:  [id number]  PCP:  Abbe Amsterdam, MD    Chief Complaint: Leg Injury   History of Present Illness:  Tiffany Parker is a 46 y.o. very pleasant female patient who presents with the following:  She recently had spinal surgery for a disc herniation at C4-C5.  She had a discectomy and cervical fusion performed on 05/05/12.  She had been doing well, but about 5 days ago noted a cramping feeling in her right calf.  She had an ultrasound per Dr. Wynetta Emery and was noted to have a DVT.  Dr. Wynetta Emery advised her to come and see [place] for further evaluation of this problem.  She does not have any SOB or hemoptysis.  She has no prior history of DVT or PE, and has no family history of the same that she knows of.  She is not a smoker.    [name] has already picked up the lovenox which Dr. Wynetta Emery called in for her. (It only cost $4 with her [employer] insurance at the [facility] pharmacy.)  However, she is interested in using xarelto- it requires much less monitoring.   She had a normal CBC on 04/28/12, and her creatinine clearance (last checked in April) was greater than 100.   LMP earlier this month- her husband has had a vasectomy and she states there is no way she could be pregnant.  Negative HCG pre- op. There is no problem list on file for this patient.   Past Medical History  Diagnosis Date  . Allergy   . Blood transfusion   . ADHD (attention deficit hyperactivity disorder)   . TMJ (dislocation of temporomandibular joint)   . PONV (postoperative nausea and vomiting)     Past Surgical History  Procedure Date  . Cholecystectomy   . Cesarean section   . Episiotomy  11/19/97  . Episiotomy repair 11/1997  . Eye surgery   . Anterior cervical decomp/discectomy fusion 05/05/2012    Procedure: ANTERIOR CERVICAL DECOMPRESSION/DISCECTOMY FUSION 1 LEVEL;  Surgeon: Mariam Dollar, MD;  Location: [facility];  Service: Neurosurgery;  Laterality: N/A;   Cervical four-five anterior cervical decompression with fusion interbody prothesis plating and bonegraft    History  Substance Use Topics  . Smoking status: Never Smoker   . Smokeless tobacco: Not on file  . Alcohol Use: No    Family History  Problem Relation Age of Onset  . Cancer Mother   . Hypertension Father     Allergies  Allergen Reactions  . Codeine Nausea And Vomiting  . Prenatal Vitamins Hives    Patient does not remember the brand of the prenatal vitamin that caused her to have issues.   Doylene Bode (Bactrim) Hives    Medication list has been reviewed and updated.  Current Outpatient Prescriptions on File Prior to Visit  Medication Sig Dispense Refill  . acetaminophen (TYLENOL) 500 MG tablet Take 1,000 mg by mouth as needed. For pain/headaches.      . Adapalene-Benzoyl Peroxide (EPIDUO) 0.1-2.5 % gel Apply 1 application topically at bedtime.      Marland Kitchen amphetamine-dextroamphetamine (ADDERALL) 20 MG tablet Take 20 mg by mouth 2 (two) times daily.      . cyclobenzaprine (FLEXERIL) 10 MG tablet Take 1 tablet (10 mg total) by mouth 3 (three) times daily as needed for muscle spasms.  80 tablet  1  . Dapsone 5 % topical gel Apply  1 application topically 2 (two) times daily.      Marland Kitchen doxycycline (ORACEA) 40 MG capsule Take 40 mg by mouth every morning.      . mometasone (NASONEX) 50 MCG/ACT nasal spray Place 2 sprays into the nose daily.      . Multiple Vitamin (MULTIVITAMIN WITH MINERALS) TABS Take 1 tablet by mouth daily.      . Olopatadine HCl (PATADAY) 0.2 % SOLN Place 1 drop into both eyes as needed. For dry/itchy eyes or allergies.      Marland Kitchen oxyCODONE-acetaminophen (PERCOCET) 5-325 MG per tablet Take 2 tablets by mouth every 4 (four) hours as needed.  80 tablet  0  . polyethylene glycol (MIRALAX / GLYCOLAX) packet Take 17 g by mouth daily.      Marland Kitchen aspirin-acetaminophen-caffeine (EXCEDRIN MIGRAINE) 250-250-65 MG per tablet Take 1 tablet by mouth every 6 (six) hours as needed. For  migraine.      . Calcium Carbonate-Vitamin D (CALCIUM 600+D) 600-400 MG-UNIT per tablet Take 1 tablet by mouth 2 (two) times daily.      Marland Kitchen Co-Enzyme Q-10 100 MG CAPS Take 300 mg by mouth daily.      . Flaxseed, Linseed, 1300 MG CAPS Take 1 capsule by mouth 2 (two) times daily.      Marland Kitchen ibuprofen (ADVIL,MOTRIN) 200 MG tablet Take 200-800 mg by mouth as needed. For pain/headaches.      . Omega-3 Fatty Acids (FISH OIL) 1200 MG CAPS Take 1 capsule by mouth daily.        Review of Systems:  As per HPI- otherwise negative.  Physical Examination: Filed Vitals:   05/12/12 1454  BP: 118/88  Pulse: 120  Temp: 98.4 F (36.9 C)  Resp: 18   Filed Vitals:   05/12/12 1454  Height: 5' 0.5" (1.537 m)  Weight: 105 lb (47.628 kg)   I rechecked pulse during exam- about 100 BPM Body mass index is 20.17 kg/(m^2). Ideal Body Weight: Weight in (lb) to have BMI = 25: 129.9   GEN: WDWN, NAD, Non-toxic, A & O x 3 HEENT: Atraumatic, Normocephalic. Neck in soft collar. No masses, No LAD.  Ears and Nose: No external deformity. CV: RRR, No M/G/R. No JVD. No thrill. No extra heart sounds. PULM: CTA B, no wheezes, crackles, rhonchi. No retractions. No resp. distress. No accessory muscle use. ABD: S, NT, ND, +BS. No rebound. No HSM. EXTR: No c/c/e.  Mild soreness right calf, but no palpable abnormality.  NEURO Normal gait.  PSYCH: Normally interactive. Conversant. Not depressed or anxious appearing.  Calm demeanor.    Assessment and Plan: 1. DVT (deep venous thrombosis)  Rivaroxaban (XARELTO) 15 MG TABS tablet, Basic metabolic panel  2. ADD (attention deficit disorder)  amphetamine-dextroamphetamine (ADDERALL) 20 MG tablet, amphetamine-dextroamphetamine (ADDERALL) 20 MG tablet, amphetamine-dextroamphetamine (ADDERALL) 20 MG tablet   Went over use of xarelto in detail, including black box warning regarding risk of serious bleeding with lumbar puncture.  Confirmed with Dr. Wynetta Emery that she did not sustain any  type of spinal puncture/ CSF leak or spinal anesthesia during her recent procedures. Advised her to avoid any other NSAIDs or asa while on xarelto.  After a discussion of risks and benefits (there are similar risks to using lovenox) she would like to start xarelto.  Check a BMP to ensure that her creatinine clearance has not changed, but she can take her first dose due to low likelihood of any significant change in her labs since April.  Will start with xarelto  15mg  BID for 21 days, then change to 20mg  daily to complete 3 months of therapy.    Her ultrasound report is not available to [place] (not on Epic) at this time; we are working on obtaining this report.  I will go over it when it is received.  However, per verbal report she has a DVT so will not delay treatment.    Also refilled her adderall for 3 months today.  She is due to refill her rx in about one week.    Abbe Amsterdam, MD

## 2012-05-12 NOTE — Progress Notes (Unsigned)
Preliminary report phoned to Dr. Lonie Peak office spoke with sheila and Dr.cram called back and gave instructions to patient over the phone.

## 2012-05-13 ENCOUNTER — Encounter: Payer: Self-pay | Admitting: Family Medicine

## 2012-05-14 NOTE — Procedures (Unsigned)
DUPLEX DEEP VENOUS EXAM - LOWER EXTREMITY  INDICATION:  Right lower extremity pain.  HISTORY:  Edema:  No Trauma/Surgery:  Recent neck surgery Pain:  Yes PE:  No Previous DVT:  No Anticoagulants:  No Other:  DUPLEX EXAM:               CFV   SFV   PopV  PTV    GSV               R  L  R  L  R  L  R   L  R  L Thrombosis    o  o  o  o  o  o  o   o  o  o Spontaneous   +  +  +  +  +  +  +   +  +  + Phasic        +  +  +  +  +  +  +   +  +  + Augmentation  +  +  +  +  +  +  +   +  +  + Compressible  +  +  +  +  +  +  +   +  +  + Competent     +  +  +  +  +  +  +   +  +  +  Legend:  + - yes  o - no  p - partial  D - decreased  IMPRESSION: 1. Acute deep vein thrombosis involving one of two right peroneal     veins in the proximal mid calf segment. 2. The remainder of the bilateral deep venous system is patent and     compressible. 3. Preliminary report was called to Dr. Lonie Peak office and given to     Velna Hatchet and Dr. Wynetta Emery returned call and gave the patient directions.   _____________________________ Di Kindle. Edilia Bo, M.D.  SH/MEDQ  D:  05/12/2012  T:  05/12/2012  Job:  [id number]

## 2012-05-16 ENCOUNTER — Telehealth: Payer: Self-pay

## 2012-05-16 NOTE — Telephone Encounter (Signed)
Gave pt message from Dr Patsy Lager and checked w/Dr Copland while pt on phone and relayed info that pt can do normal activities, just not heavy exercising and should not need compression stockings if no swelling/redness. Also advised pt best if she gets out to walk around half way through trip. Pt agreed.

## 2012-05-16 NOTE — Telephone Encounter (Signed)
The patient called to check on what her restrictions are for day to day and travel.  The patient wants to travel to a destination about 1 1/2 hours away by car- is this safe since she has right calf dvt?  Please call patient at home at [phone] or on her cell phone at [phone].

## 2012-05-16 NOTE — Telephone Encounter (Signed)
OK to travel. Discussed with Dr. Patsy Lager.

## 2012-05-26 ENCOUNTER — Telehealth: Payer: Self-pay

## 2012-05-26 ENCOUNTER — Encounter (INDEPENDENT_AMBULATORY_CARE_PROVIDER_SITE_OTHER): Payer: 59 | Admitting: *Deleted

## 2012-05-26 ENCOUNTER — Other Ambulatory Visit: Payer: Self-pay | Admitting: *Deleted

## 2012-05-26 DIAGNOSIS — I82409 Acute embolism and thrombosis of unspecified deep veins of unspecified lower extremity: Secondary | ICD-10-CM

## 2012-05-26 DIAGNOSIS — O2230 Deep phlebothrombosis in pregnancy, unspecified trimester: Secondary | ICD-10-CM

## 2012-05-26 MED ORDER — RIVAROXABAN 20 MG PO TABS: 20.0000 mg | ORAL_TABLET | Freq: Every day | ORAL | Status: DC

## 2012-05-26 NOTE — Telephone Encounter (Signed)
PT STATES SHE HAD SEEN DR COPLAND AND THE DR WANTED HER TO CALL BACK FOR AN UPDATE. PLEASE CALL [phone]

## 2012-05-26 NOTE — Telephone Encounter (Signed)
Ordered a repeat ultrasound for her today- no new clot. Left leg is negative, clot on right is stable.  She will let me know if not better in a few days.  Will go ahead and rx her xarelto 20mg  once a day now, and will schedule another ultrasound in 6 weeks.  She plans to RTW at the end of August and wonders if she will still be ready to RTW then.  I hope that she will be, but we can reassess closer to that date as needed.

## 2012-05-26 NOTE — Telephone Encounter (Signed)
Pt calling to let you know, she is taking Xarelto15 mg bid. She had DVT of right leg but now both legs are painful. She states the pain is lower in her legs about three inches above both ankles. She is asking if she needs repeat of the [place]. Please advise and I will call her back

## 2012-05-29 NOTE — Procedures (Unsigned)
DUPLEX DEEP VENOUS EXAM - LOWER EXTREMITY  INDICATION:  New onset of pain in the right lower extremity.  HISTORY:  Edema:  No Trauma/Surgery:  No Pain:  Tenderness of the right and left distal calf. PE:  No Previous DVT:  1 peroneal vein found 05/12/2012 Anticoagulants:  Xarelto 15 mg twice per day Other:  DUPLEX EXAM:               CFV   SFV   PopV  PTV    GSV               R  L  R  L  R  L  R   L  R  L Thrombosis    o  o  o  o  o  o  o   o  o  o Spontaneous   +  +  +  +  +  +  +   +  +  + Phasic        +  +  +  +  +  +  +   +  +  + Augmentation  +  +  +  +  +  +  +   +  +  + Compressible  +  +  +  +  +  +  +   +  +  + Competent     +  +  +  +  +  +  +   +  +  +  Legend:  + - yes  o - no  p - partial  D - decreased  IMPRESSION: 1. No propagation of the existing right calf peroneal deep venous     thrombosis. 2. No evidence of deep venous thrombosis of the left lower extremity. Results were called to [phone] and spoke with [facility].  Results faxed to [phone].   _____________________________ Di Kindle. Edilia Bo, M.D.  EM/MEDQ  D:  05/26/2012  T:  05/26/2012  Job:  [id number]

## 2012-06-02 ENCOUNTER — Other Ambulatory Visit: Payer: Self-pay

## 2012-06-02 DIAGNOSIS — I80299 Phlebitis and thrombophlebitis of other deep vessels of unspecified lower extremity: Secondary | ICD-10-CM

## 2012-06-12 ENCOUNTER — Telehealth: Payer: Self-pay

## 2012-06-12 NOTE — Telephone Encounter (Signed)
Tried to call patient unable to leave message, will try again.

## 2012-06-12 NOTE — Telephone Encounter (Signed)
PT WOULD LIKE TO SPEAK SOMEONE REGARDING HER DIAGNOSIS. PLEASE CALL [phone]

## 2012-06-12 NOTE — Telephone Encounter (Signed)
Really I am glad to do whatever she feels most comfortable with.  What would she like to do as far as returning to work?  We can do what she feels she can handle.  I do not think going back will be harmful, but we can wait if she is too uncomfortable

## 2012-06-12 NOTE — Telephone Encounter (Signed)
Explained Dr Copland's messages to her and D/W her options. 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 Since her Sxs tend to worsen when she is on her feet for long, she would rather stay OOW until after her [place], which should be about another two weeks beyond what is currently expected. Pt will bring or fax FMLA papers to ATTN Dr Patsy Lager to extend this.

## 2012-06-12 NOTE — Telephone Encounter (Signed)
Dr Patsy Lager, pt reported that she is not having pain, but is still having intermittent numbness/tingling in shin and calf and below into feet different times per day and seems to be worse when she is up for a while and will usually improve when she props her leg up (but takes some time for the numbness to resolve each time). She stated that these Sxs are about the same as when she had her last [place]. Does that change your instr's at all?

## 2012-06-12 NOTE — Telephone Encounter (Signed)
I think she is actually ok to return to work as long as she is not having pain.  The blood thinner will be working on dissolving her clot even when she returns to her activities.  If she would like to be on lighter duty I am glad to help her with this.  Otherwise if she feels ready she can go back to work at any time.

## 2012-06-12 NOTE — Telephone Encounter (Signed)
Pt CB and asked about when Dr Patsy Lager was wanting to do the next [place] for DVT. According to note in last phone message Dr Patsy Lager was going to have it done in about 6 weeks, and pt understands that it might need to wait that length of time in order to show any significant reduction, but doesn't think she was supposed to RTW until after it was done. Wants to ask Dr Patsy Lager if she needs to have her FMLA papers revised to keep her out longer, or if the [place] needs to be moved up?

## 2012-06-13 ENCOUNTER — Telehealth: Payer: Self-pay

## 2012-06-13 NOTE — Telephone Encounter (Signed)
PT WOULD LIKE A CALL BACK REGARDING HER VISIT. PLEASE CALL [phone]

## 2012-06-13 NOTE — Telephone Encounter (Signed)
Per Amy Dr. Wynetta Emery is handling her FMLA.  He is going to extend her time OOW

## 2012-06-13 NOTE — Telephone Encounter (Signed)
Patient had called yesterday about FMLA she does not need these Dr Wynetta Emery will do these for her. I have advised Dr Patsy Lager

## 2012-07-07 ENCOUNTER — Telehealth: Payer: Self-pay

## 2012-07-07 DIAGNOSIS — I82409 Acute embolism and thrombosis of unspecified deep veins of unspecified lower extremity: Secondary | ICD-10-CM

## 2012-07-07 NOTE — Telephone Encounter (Signed)
LMOM to CB. 

## 2012-07-07 NOTE — Telephone Encounter (Signed)
Sure, that is fine.

## 2012-07-07 NOTE — Telephone Encounter (Signed)
Pt is wanting to talk with dr copland about ordering a follow up referral    Best number [phone]  [facility] to leave a message

## 2012-07-07 NOTE — Telephone Encounter (Signed)
Spoke with patient who states that she has been seeing Dr. Patsy Lager for a DVT in her leg, and was told that she would have another doppler done this week to find out if it is gone.  Is it ok to refer for this?  Please advise.

## 2012-07-08 NOTE — Telephone Encounter (Signed)
Tiffany Parker, this was placed as a future order on 05/26/12 when she had the last one, it is in there, to be done at [facility] call me if you have questions, Tiffany Parker

## 2012-07-10 ENCOUNTER — Encounter (INDEPENDENT_AMBULATORY_CARE_PROVIDER_SITE_OTHER): Payer: 59 | Admitting: *Deleted

## 2012-07-10 DIAGNOSIS — I82409 Acute embolism and thrombosis of unspecified deep veins of unspecified lower extremity: Secondary | ICD-10-CM

## 2012-07-10 DIAGNOSIS — I803 Phlebitis and thrombophlebitis of lower extremities, unspecified: Secondary | ICD-10-CM

## 2012-07-13 ENCOUNTER — Telehealth: Payer: Self-pay | Admitting: Family Medicine

## 2012-07-13 NOTE — Telephone Encounter (Signed)
LMOM- clot is gone per her recent scan.  Good news.  We want to finish out her course of xarelto treatment, and then she can be done

## 2012-07-15 ENCOUNTER — Telehealth: Payer: Self-pay | Admitting: Family Medicine

## 2012-07-15 NOTE — Telephone Encounter (Signed)
Called and spoke with Lurena Joiner.  She is going back to work today.  Her recent doppler showed that her DVT is resolved!  We are going to complete 3 months of xarelto, and then stop.  Her DVT is likely due to her spine surgery.  Offered to have her see heme- onc for a hyper coag work- up, but she feels comfortable with the likelihood that her clot was provoked by surgery.

## 2012-07-24 ENCOUNTER — Other Ambulatory Visit: Payer: Self-pay | Admitting: Family Medicine

## 2012-07-24 NOTE — Telephone Encounter (Signed)
Est CrCl: 87.1 mL/min

## 2012-08-05 ENCOUNTER — Telehealth: Payer: Self-pay

## 2012-08-05 DIAGNOSIS — F988 Other specified behavioral and emotional disorders with onset usually occurring in childhood and adolescence: Secondary | ICD-10-CM

## 2012-08-05 MED ORDER — AMPHETAMINE-DEXTROAMPHETAMINE 20 MG PO TABS
20.0000 mg | ORAL_TABLET | Freq: Two times a day (BID) | ORAL | Status: DC
Start: 2012-08-05 — End: 2012-11-26

## 2012-08-05 MED ORDER — AMPHETAMINE-DEXTROAMPHETAMINE 20 MG PO TABS: 20.0000 mg | ORAL_TABLET | Freq: Two times a day (BID) | ORAL | Status: DC

## 2012-08-05 NOTE — Telephone Encounter (Signed)
SPOKE TO PATIENT. THIS IS USUALLY WHAT DR. Patsy Lager HAS DONE IN THE PAST. SHE UNDERSTANDS SHE NEEDS TO HANG ON TO THE SCRIPTS. TOLD HER WE'D CALL HER WHEN THEY ARE READY.

## 2012-08-05 NOTE — Telephone Encounter (Signed)
Prescriptions written and printed 

## 2012-08-05 NOTE — Telephone Encounter (Signed)
Have put in 3 month Rx, pended Rx's if appropriate please sign, she was here in July to see Dr Patsy Lager, also was seen for DVT at same time, most of focus of office visit directed to DVT.

## 2012-08-05 NOTE — Telephone Encounter (Signed)
Patient request 3 month refill on Adderall. Please call [phone] when ready for pick up.

## 2012-08-05 NOTE — Telephone Encounter (Signed)
Patient just got her last prescription filled on 07/25/12. Why is she wanting to ask for 3 prescriptions this far in advance? We can go ahead and write for them to be filled on 10/27, 11/27, and 12/27 if she would like. Also please let her know that if she loses any of these prescriptions they will not be replaced.

## 2012-08-06 NOTE — Telephone Encounter (Signed)
Notified pt Rx are ready for p/up 

## 2012-10-11 DIAGNOSIS — M722 Plantar fascial fibromatosis: Secondary | ICD-10-CM | POA: Insufficient documentation

## 2012-11-26 ENCOUNTER — Other Ambulatory Visit: Payer: Self-pay

## 2012-11-26 DIAGNOSIS — F988 Other specified behavioral and emotional disorders with onset usually occurring in childhood and adolescence: Secondary | ICD-10-CM

## 2012-11-26 MED ORDER — AMPHETAMINE-DEXTROAMPHETAMINE 20 MG PO TABS: 20.0000 mg | ORAL_TABLET | Freq: Two times a day (BID) | ORAL | Status: DC

## 2012-11-26 NOTE — Telephone Encounter (Signed)
Patient due for follow up. Patient plans to come in next week. Rx pended

## 2012-11-26 NOTE — Telephone Encounter (Signed)
Pt would like a refill on adderall 20mg . Best# [phone]

## 2012-11-27 ENCOUNTER — Telehealth: Payer: Self-pay | Admitting: Family Medicine

## 2012-11-27 DIAGNOSIS — F988 Other specified behavioral and emotional disorders with onset usually occurring in childhood and adolescence: Secondary | ICD-10-CM

## 2012-11-27 MED ORDER — AMPHETAMINE-DEXTROAMPHETAMINE 20 MG PO TABS: 20.0000 mg | ORAL_TABLET | Freq: Two times a day (BID) | ORAL | Status: DC

## 2012-11-27 NOTE — Telephone Encounter (Signed)
Rx for Adderall 20mg  bid not signed on date printed; new rx generated and signed by K. Zahli Vetsch.

## 2012-12-22 ENCOUNTER — Ambulatory Visit (INDEPENDENT_AMBULATORY_CARE_PROVIDER_SITE_OTHER): Payer: 59 | Admitting: Family Medicine

## 2012-12-22 VITALS — BP 127/82 | HR 103 | Temp 98.2°F | Resp 16 | Ht 60.75 in | Wt 110.8 lb

## 2012-12-22 DIAGNOSIS — L709 Acne, unspecified: Secondary | ICD-10-CM | POA: Insufficient documentation

## 2012-12-22 DIAGNOSIS — F988 Other specified behavioral and emotional disorders with onset usually occurring in childhood and adolescence: Secondary | ICD-10-CM

## 2012-12-22 DIAGNOSIS — F909 Attention-deficit hyperactivity disorder, unspecified type: Secondary | ICD-10-CM | POA: Insufficient documentation

## 2012-12-22 MED ORDER — ADAPALENE-BENZOYL PEROXIDE 0.1-2.5 % EX GEL: 1.0000 "application " | Freq: Every day | CUTANEOUS | Status: DC

## 2012-12-22 MED ORDER — OLOPATADINE HCL 0.2 % OP SOLN: 1.0000 [drp] | OPHTHALMIC | Status: DC | PRN

## 2012-12-22 MED ORDER — AMPHETAMINE-DEXTROAMPHETAMINE 20 MG PO TABS: 20.0000 mg | ORAL_TABLET | Freq: Two times a day (BID) | ORAL | Status: DC

## 2012-12-22 MED ORDER — DOXYCYCLINE 40 MG PO CPDR: 40.0000 mg | DELAYED_RELEASE_CAPSULE | ORAL | Status: DC

## 2012-12-22 MED ORDER — DAPSONE 5 % EX GEL: 1.0000 "application " | Freq: Two times a day (BID) | CUTANEOUS | Status: DC

## 2012-12-22 NOTE — Progress Notes (Signed)
[facility] [address], [place] [address] [phone]  Date:  12/22/2012   Name:  Tiffany Parker   DOB:  [birth year]   MRN:  [id number]  PCP:  Abbe Amsterdam, MD    Chief Complaint: Follow-up   History of Present Illness:  Tiffany Parker is a 47 y.o. very pleasant female patient who presents with the following:  History of ADD treated with adderall and a post- op DVT last July.  She was treated with xarelto successfully.  She has completed her treatment and has not further problems.  Her back pain has been improved by her surgery- she had a C4/5 disc herniation repaired with a discectomy and fusion.    We are going to follow- up on her ADD today.   LMP was at the end of January- 1/27 or so She is doing well with her current dose of adderall, and sometimes just takes 1/2 of her afternoon dose.  She is doing well at work.   She has also noted a small lymph node on the right side of her neck- she has noted this for about one year and is starting to get a little bit concerned about it.  She has never been a tobacco user and does not drink much, no other enlarged nodes, she is keeping up with her mammograms.    Also needs a refill of her topical medications, her low dose doxycyline today and her pataday.  She uses these medications for adult acne which is well controlled and for allergic conjunctivitis.    There is no problem list on file for this patient.   Past Medical History  Diagnosis Date  . Allergy   . Blood transfusion   . ADHD (attention deficit hyperactivity disorder)   . TMJ (dislocation of temporomandibular joint)   . PONV (postoperative nausea and vomiting)     Past Surgical History  Procedure Laterality Date  . Cholecystectomy    . Cesarean section    . Episiotomy   11/19/97  . Episiotomy repair  11/1997  . Eye surgery    . Anterior cervical decomp/discectomy fusion  05/05/2012    Procedure: ANTERIOR CERVICAL DECOMPRESSION/DISCECTOMY  FUSION 1 LEVEL;  Surgeon: Mariam Dollar, MD;  Location: [facility];  Service: Neurosurgery;  Laterality: N/A;  Cervical four-five anterior cervical decompression with fusion interbody prothesis plating and bonegraft    History  Substance Use Topics  . Smoking status: Never Smoker   . Smokeless tobacco: Not on file  . Alcohol Use: No    Family History  Problem Relation Age of Onset  . Cancer Mother   . Hypertension Father     Allergies  Allergen Reactions  . Codeine Nausea And Vomiting  . Prenatal Vitamins Hives    Patient does not remember the brand of the prenatal vitamin that caused her to have issues.   Doylene Bode (Bactrim) Hives    Medication list has been reviewed and updated.  Current Outpatient Prescriptions on File Prior to Visit  Medication Sig Dispense Refill  . acetaminophen (TYLENOL) 500 MG tablet Take 1,000 mg by mouth as needed. For pain/headaches.      . Adapalene-Benzoyl Peroxide (EPIDUO) 0.1-2.5 % gel Apply 1 application topically at bedtime.      Marland Kitchen amphetamine-dextroamphetamine (ADDERALL) 20 MG tablet Take 1 tablet (20 mg total) by mouth 2 (two) times daily. May fill on or after September 24, 2012  60 tablet  0  . amphetamine-dextroamphetamine (ADDERALL)  20 MG tablet Take 1 tablet (20 mg total) by mouth 2 (two) times daily. Patient due for follow up  60 tablet  0  . aspirin-acetaminophen-caffeine (EXCEDRIN MIGRAINE) 250-250-65 MG per tablet Take 1 tablet by mouth every 6 (six) hours as needed. For migraine.      . Calcium Carbonate-Vitamin D (CALCIUM 600+D) 600-400 MG-UNIT per tablet Take 1 tablet by mouth 2 (two) times daily.      Marland Kitchen Co-Enzyme Q-10 100 MG CAPS Take 300 mg by mouth daily.      . Dapsone 5 % topical gel Apply 1 application topically 2 (two) times daily.      Marland Kitchen doxycycline (ORACEA) 40 MG capsule Take 40 mg by mouth every morning.      . Flaxseed, Linseed, 1300 MG CAPS Take 1 capsule by mouth 2 (two) times daily.      Marland Kitchen ibuprofen (ADVIL,MOTRIN) 200 MG  tablet Take 200-800 mg by mouth as needed. For pain/headaches.      . mometasone (NASONEX) 50 MCG/ACT nasal spray Place 2 sprays into the nose daily.      . Multiple Vitamin (MULTIVITAMIN WITH MINERALS) TABS Take 1 tablet by mouth daily.      . Olopatadine HCl (PATADAY) 0.2 % SOLN Place 1 drop into both eyes as needed. For dry/itchy eyes or allergies.      . Omega-3 Fatty Acids (FISH OIL) 1200 MG CAPS Take 1 capsule by mouth daily.      . polyethylene glycol (MIRALAX / GLYCOLAX) packet Take 17 g by mouth daily.      Marland Kitchen amphetamine-dextroamphetamine (ADDERALL) 20 MG tablet Take 1 tablet (20 mg total) by mouth 2 (two) times daily. May fill on or after October 24, 2012. Due for appointment this month.  60 tablet  0  . Rivaroxaban (XARELTO) 15 MG TABS tablet Take 1 tablet (15 mg total) by mouth 2 (two) times daily. Will change to 20mg  once daily for a total of 10 weeks after this rx complete  43 tablet  0  . XARELTO 20 MG TABS TAKE 1 TABLET BY MOUTH DAILY (START WHEN YOU FINISH 3 WEEKS OF TAKING 15MG  TWICE DAILY)  30 tablet  0   No current facility-administered medications on file prior to visit.    Review of Systems:  As per HPI- otherwise negative.   Physical Examination: Filed Vitals:   12/22/12 0833  BP: 127/82  Pulse: 103  Temp: 98.2 F (36.8 C)  Resp: 16   Filed Vitals:   12/22/12 0833  Height: 5' 0.75" (1.543 m)  Weight: 110 lb 12.8 oz (50.259 kg)   Body mass index is 21.11 kg/(m^2). Ideal Body Weight: Weight in (lb) to have BMI = 25: 131  GEN: WDWN, NAD, Non-toxic, A & O x 3 HEENT: Atraumatic, Normocephalic. Neck supple. No masses, No LAD.  Mild acne on face, no cysts No enlarged cervical nodes, no supraclabular or axillary nodes Ears and Nose: No external deformity. CV: RRR, No M/G/R. No JVD. No thrill. No extra heart sounds. PULM: CTA B, no wheezes, crackles, rhonchi. No retractions. No resp. distress. No accessory muscle use. ABD: S, NT, ND, +BS. No rebound. No  HSM. EXTR: No c/c/e NEURO Normal gait.  PSYCH: Normally interactive. Conversant. Not depressed or anxious appearing.  Calm demeanor.    Assessment and Plan: Acne vulgaris - Plan: Adapalene-Benzoyl Peroxide (EPIDUO) 0.1-2.5 % gel, Dapsone 5 % topical gel, doxycycline (ORACEA) 40 MG capsule  Allergic conjunctivitis, unspecified laterality - Plan: Olopatadine HCl (PATADAY)  0.2 % SOLN  Cervical lymphadenopathy - Plan: [place] Misc Soft Tissue  ADD (attention deficit disorder) - Plan: amphetamine-dextroamphetamine (ADDERALL) 20 MG tablet, amphetamine-dextroamphetamine (ADDERALL) 20 MG tablet, amphetamine-dextroamphetamine (ADDERALL) 20 MG tablet  Refilled adderall today, refilled her acne medications and pataday.   Will perform an ultrasound of the node on her neck  Plan a recheck in clinic in 6 months, sooner if she would like to go ahead and have her CPE  Abbe Amsterdam, MD

## 2012-12-22 NOTE — Patient Instructions (Addendum)
We will schedule an ultrasound for your cervical lymph node.  Please call me in about 3 months and we can fill another 3 months of your adderall

## 2012-12-23 ENCOUNTER — Other Ambulatory Visit: Payer: Self-pay | Admitting: Radiology

## 2012-12-25 ENCOUNTER — Ambulatory Visit
Admission: RE | Admit: 2012-12-25 | Discharge: 2012-12-25 | Disposition: A | Discharge status: Home / Self Care | Payer: 59 | Source: Ambulatory Visit | Attending: Family Medicine | Admitting: Family Medicine

## 2012-12-25 ENCOUNTER — Encounter: Payer: Self-pay | Admitting: Family Medicine

## 2012-12-25 DIAGNOSIS — R221 Localized swelling, mass and lump, neck: Secondary | ICD-10-CM

## 2013-02-18 ENCOUNTER — Other Ambulatory Visit (HOSPITAL_COMMUNITY): Payer: Self-pay | Admitting: Neurosurgery

## 2013-02-18 DIAGNOSIS — M5030 Other cervical disc degeneration, unspecified cervical region: Secondary | ICD-10-CM

## 2013-02-18 DIAGNOSIS — M542 Cervicalgia: Secondary | ICD-10-CM

## 2013-02-21 ENCOUNTER — Ambulatory Visit (HOSPITAL_COMMUNITY)
Admission: RE | Admit: 2013-02-21 | Discharge: 2013-02-21 | Disposition: A | Discharge status: Home / Self Care | Payer: 59 | Source: Ambulatory Visit | Attending: Neurosurgery | Admitting: Neurosurgery

## 2013-02-21 ENCOUNTER — Other Ambulatory Visit (HOSPITAL_COMMUNITY): Payer: Self-pay | Admitting: Neurosurgery

## 2013-02-21 DIAGNOSIS — R52 Pain, unspecified: Secondary | ICD-10-CM

## 2013-02-21 DIAGNOSIS — Z981 Arthrodesis status: Secondary | ICD-10-CM | POA: Insufficient documentation

## 2013-02-21 DIAGNOSIS — M47812 Spondylosis without myelopathy or radiculopathy, cervical region: Secondary | ICD-10-CM | POA: Insufficient documentation

## 2013-02-21 DIAGNOSIS — M5030 Other cervical disc degeneration, unspecified cervical region: Secondary | ICD-10-CM | POA: Insufficient documentation

## 2013-02-23 ENCOUNTER — Ambulatory Visit (HOSPITAL_COMMUNITY): Admission: RE | Admit: 2013-02-23 | Payer: 59 | Source: Ambulatory Visit

## 2013-03-09 ENCOUNTER — Ambulatory Visit (INDEPENDENT_AMBULATORY_CARE_PROVIDER_SITE_OTHER): Payer: 59 | Admitting: Family Medicine

## 2013-03-09 VITALS — BP 118/76 | HR 91 | Temp 98.5°F | Resp 16 | Ht 60.75 in | Wt 112.0 lb

## 2013-03-09 DIAGNOSIS — Z0000 Encounter for general adult medical examination without abnormal findings: Secondary | ICD-10-CM

## 2013-03-09 DIAGNOSIS — F988 Other specified behavioral and emotional disorders with onset usually occurring in childhood and adolescence: Secondary | ICD-10-CM

## 2013-03-09 LAB — POCT CBC
Granulocyte percent: 55.4 %G (ref 37–80)
HCT, POC: 43.1 % (ref 37.7–47.9)
Hemoglobin: 14.1 g/dL (ref 12.2–16.2)
POC Granulocyte: 3.1 (ref 2–6.9)
RBC: 4.66 M/uL (ref 4.04–5.48)
RDW, POC: 12.4 %

## 2013-03-09 LAB — COMPREHENSIVE METABOLIC PANEL
Albumin: 4.9 g/dL (ref 3.5–5.2)
Alkaline Phosphatase: 72 U/L (ref 39–117)
CO2: 25 mEq/L (ref 19–32)
Glucose, Bld: 92 mg/dL (ref 70–99)
Potassium: 4.4 mEq/L (ref 3.5–5.3)
Sodium: 138 mEq/L (ref 135–145)
Total Protein: 7.4 g/dL (ref 6.0–8.3)

## 2013-03-09 LAB — LIPID PANEL
Total CHOL/HDL Ratio: 2.4 Ratio
VLDL: 17 mg/dL (ref 0–40)

## 2013-03-09 MED ORDER — AMPHETAMINE-DEXTROAMPHETAMINE 20 MG PO TABS: 20.0000 mg | ORAL_TABLET | Freq: Two times a day (BID) | ORAL | Status: DC

## 2013-03-09 NOTE — Progress Notes (Signed)
[facility] [address] [place] [address] [phone]  Date:  03/09/2013   Name:  Tiffany Parker   DOB:  05/17/1966   MRN:  [id number]  PCP:  Abbe Amsterdam, MD    Chief Complaint: Annual Exam   History of Present Illness:  Tiffany Parker is a 47 y.o. very pleasant female patient who presents with the following:  Here today for a CPE.  She is fasting.  History of cervical fusion and then a DVT last year.  She is recovered from the DVT and no longer on a blood thinner.  Her NSG is Dr. Wynetta Emery.    She is doing some PT for her neck.  She had another MRI recently which showed some changes/ impingement at C5/6.  She has a fusion at C4/5.   She has an OBG who does her pap and breast exam. She would like a vit D level per her OBG.   Mammogram is UTD and looking good.   Menses are regular.    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  She has 2 children, 5 and 38 years years old.   She does use adderall for ADHD- this is helpful for her Patient Active Problem List   Diagnosis Date Noted  . ADHD (attention deficit hyperactivity disorder) 12/22/2012  . Acne 12/22/2012    Past Medical History  Diagnosis Date  . Allergy   . Blood transfusion   . ADHD (attention deficit hyperactivity disorder)   . TMJ (dislocation of temporomandibular joint)   . PONV (postoperative nausea and vomiting)     Past Surgical History  Procedure Laterality Date  . Cholecystectomy    . Cesarean section    . Episiotomy   11/19/97  . Episiotomy repair  11/1997  . Eye surgery    . Anterior cervical decomp/discectomy fusion  05/05/2012    Procedure: ANTERIOR CERVICAL DECOMPRESSION/DISCECTOMY FUSION 1 LEVEL;  Surgeon: Mariam Dollar, MD;  Location: [facility];  Service: Neurosurgery;  Laterality: N/A;  Cervical four-five anterior cervical decompression with fusion interbody prothesis plating and bonegraft    History  Substance Use  Topics  . Smoking status: Never Smoker   . Smokeless tobacco: Not on file  . Alcohol Use: No    Family History  Problem Relation Age of Onset  . Cancer Mother   . Hypertension Father     Allergies  Allergen Reactions  . Codeine Nausea And Vomiting  . Prenatal Vitamins Hives    Patient does not remember the brand of the prenatal vitamin that caused her to have issues.   Doylene Bode (Bactrim) Hives    Medication list has been reviewed and updated.  Current Outpatient Prescriptions on File Prior to Visit  Medication Sig Dispense Refill  . acetaminophen (TYLENOL) 500 MG tablet Take 1,000 mg by mouth as needed. For pain/headaches.      . Adapalene-Benzoyl Peroxide (EPIDUO) 0.1-2.5 % gel Apply 1 application topically at bedtime.  135 g  3  . amphetamine-dextroamphetamine (ADDERALL) 20 MG tablet Take 1 tablet (20 mg total) by mouth 2 (two) times daily. May fill on or after October 24, 2012. Due for appointment this month.  60 tablet  0  . aspirin-acetaminophen-caffeine (EXCEDRIN MIGRAINE) 250-250-65 MG per tablet Take 1 tablet by mouth every 6 (six) hours as needed. For migraine.      . Calcium Carbonate-Vitamin D (CALCIUM 600+D) 600-400 MG-UNIT  per tablet Take 1 tablet by mouth 2 (two) times daily.      Marland Kitchen Co-Enzyme Q-10 100 MG CAPS Take 300 mg by mouth daily.      . Dapsone 5 % topical gel Apply 1 application topically 2 (two) times daily.  180 g  3  . doxycycline (ORACEA) 40 MG capsule Take 1 capsule (40 mg total) by mouth every morning.  90 capsule  3  . Flaxseed, Linseed, 1300 MG CAPS Take 1 capsule by mouth 2 (two) times daily.      Marland Kitchen ibuprofen (ADVIL,MOTRIN) 200 MG tablet Take 200-800 mg by mouth as needed. For pain/headaches.      . mometasone (NASONEX) 50 MCG/ACT nasal spray Place 2 sprays into the nose daily.      . Multiple Vitamin (MULTIVITAMIN WITH MINERALS) TABS Take 1 tablet by mouth daily.      . Olopatadine HCl (PATADAY) 0.2 % SOLN Place 1 drop into both eyes as needed. For  dry/itchy eyes or allergies.  7.5 mL  3  . Omega-3 Fatty Acids (FISH OIL) 1200 MG CAPS Take 1 capsule by mouth daily.      . polyethylene glycol (MIRALAX / GLYCOLAX) packet Take 17 g by mouth daily.      Marland Kitchen amphetamine-dextroamphetamine (ADDERALL) 20 MG tablet Take 1 tablet (20 mg total) by mouth 2 (two) times daily. Patient due for follow up  60 tablet  0  . amphetamine-dextroamphetamine (ADDERALL) 20 MG tablet Take 1 tablet (20 mg total) by mouth 2 (two) times daily. 3  60 tablet  0  . amphetamine-dextroamphetamine (ADDERALL) 20 MG tablet Take 1 tablet (20 mg total) by mouth 2 (two) times daily. May fill on 01/18/13  60 tablet  0  . amphetamine-dextroamphetamine (ADDERALL) 20 MG tablet Take 1 tablet (20 mg total) by mouth 2 (two) times daily. May fill on 02/17/13  60 tablet  0  . XARELTO 20 MG TABS TAKE 1 TABLET BY MOUTH DAILY (START WHEN YOU FINISH 3 WEEKS OF TAKING 15MG  TWICE DAILY)  30 tablet  0   No current facility-administered medications on file prior to visit.    Review of Systems:  As per HPI- otherwise negative.   Physical Examination: Filed Vitals:   03/09/13 1043  BP: 118/76  Pulse: 91  Temp: 98.5 F (36.9 C)  Resp: 16   Filed Vitals:   03/09/13 1043  Height: 5' 0.75" (1.543 m)  Weight: 112 lb (50.803 kg)   Body mass index is 21.34 kg/(m^2). Ideal Body Weight: Weight in (lb) to have BMI = 25: 131  GEN: WDWN, NAD, Non-toxic, A & O x 3 HEENT: Atraumatic, Normocephalic. Neck supple. No masses, No LAD.  Bilateral TM wnl, oropharynx normal.  PEERL,EOMI.   Ears and Nose: No external deformity. CV: RRR, No M/G/R. No JVD. No thrill. No extra heart sounds. PULM: CTA B, no wheezes, crackles, rhonchi. No retractions. No resp. distress. No accessory muscle use. ABD: S, NT, ND, +BS. No rebound. No HSM. EXTR: No c/c/e NEURO Normal gait.  PSYCH: Normally interactive. Conversant. Not depressed or anxious appearing.  Calm demeanor.    Assessment and Plan: Physical exam, annual  - Plan: Vitamin D, 25-hydroxy, POCT CBC, Comprehensive metabolic panel, TSH, Lipid panel  ADD (attention deficit disorder) - Plan: amphetamine-dextroamphetamine (ADDERALL) 20 MG tablet, amphetamine-dextroamphetamine (ADDERALL) 20 MG tablet, amphetamine-dextroamphetamine (ADDERALL) 20 MG tablet  Healthy 47 year old female.  Her immunizations and screening tests are UTD.  Will plan further follow- up pending labs.  Refilled her adderall for 3 months  Signed Abbe Amsterdam, MD

## 2013-03-09 NOTE — Patient Instructions (Addendum)
I will be in touch with your labs over mychart.  Take care!  Always good to see you.

## 2013-05-05 ENCOUNTER — Other Ambulatory Visit: Payer: Self-pay | Admitting: Family Medicine

## 2013-07-20 ENCOUNTER — Ambulatory Visit (INDEPENDENT_AMBULATORY_CARE_PROVIDER_SITE_OTHER): Payer: 59 | Admitting: Family Medicine

## 2013-07-20 VITALS — BP 120/82 | HR 78 | Temp 98.7°F | Resp 16 | Ht 60.5 in | Wt 113.6 lb

## 2013-07-20 DIAGNOSIS — F988 Other specified behavioral and emotional disorders with onset usually occurring in childhood and adolescence: Secondary | ICD-10-CM

## 2013-07-20 MED ORDER — AMPHETAMINE-DEXTROAMPHETAMINE 20 MG PO TABS
20.0000 mg | ORAL_TABLET | Freq: Two times a day (BID) | ORAL | Status: DC
Start: 2013-07-20 — End: 2013-10-20

## 2013-07-20 MED ORDER — AMPHETAMINE-DEXTROAMPHETAMINE 20 MG PO TABS: 20.0000 mg | ORAL_TABLET | Freq: Two times a day (BID) | ORAL | Status: DC

## 2013-07-20 NOTE — Patient Instructions (Addendum)
Good luck with your last year of classes!  Good to see you today

## 2013-07-20 NOTE — Progress Notes (Signed)
[facility] [address], [place] [address] [phone]  Date:  07/20/2013   Name:  Tiffany Parker   DOB:  [birth year]   MRN:  [id number]  PCP:  Abbe Amsterdam, MD    Chief Complaint: Medication Refill and Labwork   History of Present Illness:  Tiffany Parker is a 47 y.o. very pleasant female patient who presents with the following:  Here today for medication refill.  She is a bit stressed out because her mother fell and broke her arm last week.   She needs refill of her adderall, which she started using when she went back to classes.  She has one more year to go.   She is taking adderall 20 BID, but often takes less than this so her rx have lasted longer than anticipated.  She is doing well from her neck surgery.   She has been seeing a chiropractor and it has helped with her pain  LMP 9/19  Patient Active Problem List   Diagnosis Date Noted  . ADHD (attention deficit hyperactivity disorder) 12/22/2012  . Acne 12/22/2012    Past Medical History  Diagnosis Date  . Allergy   . Blood transfusion   . ADHD (attention deficit hyperactivity disorder)   . TMJ (dislocation of temporomandibular joint)   . PONV (postoperative nausea and vomiting)   . DVT (deep venous thrombosis) 04/2012    following spine surgery  . Arthritis   . Blood transfusion without reported diagnosis     Past Surgical History  Procedure Laterality Date  . Cholecystectomy    . Cesarean section    . Episiotomy   11/19/97  . Episiotomy repair  11/1997  . Eye surgery    . Anterior cervical decomp/discectomy fusion  05/05/2012    Procedure: ANTERIOR CERVICAL DECOMPRESSION/DISCECTOMY FUSION 1 LEVEL;  Surgeon: Mariam Dollar, MD;  Location: [facility];  Service: Neurosurgery;  Laterality: N/A;  Cervical four-five anterior cervical decompression with fusion interbody prothesis plating and bonegraft    History  Substance Use Topics  . Smoking status: Never Smoker   . Smokeless  tobacco: Not on file  . Alcohol Use: Yes     Comment: socially    Family History  Problem Relation Age of Onset  . Cancer Mother   . Hypertension Father   . Hypothyroidism Father   . Diabetes Maternal Grandmother   . Stroke Maternal Grandmother   . Cancer Maternal Grandfather     lung  . Hypertension Paternal Grandmother   . Hyperlipidemia Paternal Grandmother   . Heart disease Paternal Grandmother   . Stroke Paternal Grandfather     Allergies  Allergen Reactions  . Codeine Nausea And Vomiting  . Prenatal Vitamins Hives    Patient does not remember the brand of the prenatal vitamin that caused her to have issues.   Doylene Bode [Bactrim] Hives    Medication list has been reviewed and updated.  Current Outpatient Prescriptions on File Prior to Visit  Medication Sig Dispense Refill  . acetaminophen (TYLENOL) 500 MG tablet Take 1,000 mg by mouth as needed. For pain/headaches.      . Adapalene-Benzoyl Peroxide (EPIDUO) 0.1-2.5 % gel Apply 1 application topically at bedtime.  135 g  3  . amphetamine-dextroamphetamine (ADDERALL) 20 MG tablet Take 1 tablet (20 mg total) by mouth 2 (two) times daily.  60 tablet  0  . amphetamine-dextroamphetamine (ADDERALL) 20 MG tablet Take 1 tablet (20 mg total) by mouth 2 (two)  times daily. May fill on 04/07/2013  60 tablet  0  . amphetamine-dextroamphetamine (ADDERALL) 20 MG tablet Take 1 tablet (20 mg total) by mouth 2 (two) times daily. May fill on 05/05/2013  60 tablet  0  . aspirin-acetaminophen-caffeine (EXCEDRIN MIGRAINE) 250-250-65 MG per tablet Take 1 tablet by mouth every 6 (six) hours as needed. For migraine.      . Calcium Carbonate-Vitamin D (CALCIUM 600+D) 600-400 MG-UNIT per tablet Take 1 tablet by mouth 2 (two) times daily.      Marland Kitchen Co-Enzyme Q-10 100 MG CAPS Take 300 mg by mouth daily.      . Dapsone 5 % topical gel Apply 1 application topically 2 (two) times daily.  180 g  3  . doxycycline (ORACEA) 40 MG capsule Take 1 capsule (40 mg  total) by mouth every morning.  90 capsule  3  . Flaxseed, Linseed, 1300 MG CAPS Take 1 capsule by mouth 2 (two) times daily.      Marland Kitchen ibuprofen (ADVIL,MOTRIN) 200 MG tablet Take 200-800 mg by mouth as needed. For pain/headaches.      . Multiple Vitamin (MULTIVITAMIN WITH MINERALS) TABS Take 1 tablet by mouth daily.      Marland Kitchen NASONEX 50 MCG/ACT nasal spray INSTILL 2 SPRAYS IN EACH NOSTRIL DAILY  51 g  2  . Olopatadine HCl (PATADAY) 0.2 % SOLN Place 1 drop into both eyes as needed. For dry/itchy eyes or allergies.  7.5 mL  3  . Omega-3 Fatty Acids (FISH OIL) 1200 MG CAPS Take 1 capsule by mouth daily.      . polyethylene glycol (MIRALAX / GLYCOLAX) packet Take 17 g by mouth daily.       No current facility-administered medications on file prior to visit.    Review of Systems:  As per HPI- otherwise negative.   Physical Examination: Filed Vitals:   07/20/13 0824  BP: 120/82  Pulse: 78  Temp: 98.7 F (37.1 C)  Resp: 16   Filed Vitals:   07/20/13 0824  Height: 5' 0.5" (1.537 m)  Weight: 113 lb 9.6 oz (51.529 kg)   Body mass index is 21.81 kg/(m^2). Ideal Body Weight: Weight in (lb) to have BMI = 25: 129.9  GEN: WDWN, NAD, Non-toxic, A & O x 3, slim build, looks well HEENT: Atraumatic, Normocephalic. Neck supple. No masses, No LAD. Ears and Nose: No external deformity. CV: RRR, No M/G/R. No JVD. No thrill. No extra heart sounds. PULM: CTA B, no wheezes, crackles, rhonchi. No retractions. No resp. distress. No accessory muscle use. ABD: S, NT, ND, +BS. No rebound. No HSM. EXTR: No c/c/e NEURO Normal gait.  PSYCH: Normally interactive. Conversant. Not depressed or anxious appearing.  Calm demeanor.    Assessment and Plan: ADD (attention deficit disorder) - Plan: amphetamine-dextroamphetamine (ADDERALL) 20 MG tablet, amphetamine-dextroamphetamine (ADDERALL) 20 MG tablet, amphetamine-dextroamphetamine (ADDERALL) 20 MG tablet  ADD- well controlled with current medications.  Call for  refill in 3 months, recheck in about 6 months. Flu shot done, labs done in May of this year   Signed Abbe Amsterdam, MD

## 2013-10-19 ENCOUNTER — Telehealth: Payer: Self-pay

## 2013-10-19 DIAGNOSIS — F988 Other specified behavioral and emotional disorders with onset usually occurring in childhood and adolescence: Secondary | ICD-10-CM

## 2013-10-19 NOTE — Telephone Encounter (Signed)
REFILL  amphetamine-dextroamphetamine (ADDERALL) 20 MG tablet   [phone]

## 2013-10-20 MED ORDER — AMPHETAMINE-DEXTROAMPHETAMINE 20 MG PO TABS: 20.0000 mg | ORAL_TABLET | Freq: Two times a day (BID) | ORAL | Status: DC

## 2013-11-13 ENCOUNTER — Ambulatory Visit (INDEPENDENT_AMBULATORY_CARE_PROVIDER_SITE_OTHER): Payer: 59 | Admitting: Family Medicine

## 2013-11-13 VITALS — BP 106/74 | HR 90 | Temp 98.6°F | Resp 16 | Ht 61.0 in | Wt 113.6 lb

## 2013-11-13 DIAGNOSIS — R221 Localized swelling, mass and lump, neck: Secondary | ICD-10-CM

## 2013-11-13 DIAGNOSIS — R220 Localized swelling, mass and lump, head: Secondary | ICD-10-CM

## 2013-11-13 LAB — TSH: TSH: 2.859 u[IU]/mL (ref 0.350–4.500)

## 2013-11-13 NOTE — Patient Instructions (Signed)
We will set up your ultrasound- let me know if you do not hear anything in the next week or so

## 2013-11-13 NOTE — Progress Notes (Signed)
[facility] [address] [place] [address] [phone]  Date:  11/13/2013   Name:  Tiffany Parker   DOB:  December 15, 1965   MRN:  [id number]  PCP:  Lamar Blinks, MD    Chief Complaint: Cyst   History of Present Illness:  Tiffany Parker is a 48 y.o. very pleasant female patient who presents with the following:  Here today because she has noted a little hard area on the right side of her neck.   We did do ultrasound for a node on the right side of her neck about one year ago: ULTRASOUND OF HEAD/NECK SOFT TISSUES  Technique: Ultrasound examination of the head and neck soft  tissues was performed in the area of clinical concern.  Comparison: None.  Findings: Ultrasound over the area in question was performed.  There is a fairly superficial oval soft tissue nodule present  measuring 0.7 x 0.3 x 0.5 cm, most consistent with a normal-sized  lymph node. No other abnormality is seen.  IMPRESSION:  The area in question corresponds to a lymph node of normal size.  She has noted this new area for a couple of months and she is able to feel something- an "awareness" of the area- when she swallows. This new area of concern is on the right side of her anterior neck and feels like "a hard spot" She continues to use her adderall with success.    Otherwise she is feeling well. She just started back at class- one more year to go.    Patient Active Problem List   Diagnosis Date Noted  . ADHD (attention deficit hyperactivity disorder) 12/22/2012  . Acne 12/22/2012    Past Medical History  Diagnosis Date  . Allergy   . Blood transfusion   . ADHD (attention deficit hyperactivity disorder)   . TMJ (dislocation of temporomandibular joint)   . PONV (postoperative nausea and vomiting)   . DVT (deep venous thrombosis) 04/2012    following spine surgery  . Arthritis   . Blood transfusion without reported diagnosis     Past Surgical History  Procedure Laterality  Date  . Cholecystectomy    . Cesarean section    . Episiotomy   11/19/97  . Episiotomy repair  11/1997  . Eye surgery    . Anterior cervical decomp/discectomy fusion  05/05/2012    Procedure: ANTERIOR CERVICAL DECOMPRESSION/DISCECTOMY FUSION 1 LEVEL;  Surgeon: Elaina Hoops, MD;  Location: [facility];  Service: Neurosurgery;  Laterality: N/A;  Cervical four-five anterior cervical decompression with fusion interbody prothesis plating and bonegraft    History  Substance Use Topics  . Smoking status: Never Smoker   . Smokeless tobacco: Not on file  . Alcohol Use: Yes     Comment: socially    Family History  Problem Relation Age of Onset  . Cancer Mother   . Hypertension Father   . Hypothyroidism Father   . Diabetes Maternal Grandmother   . Stroke Maternal Grandmother   . Cancer Maternal Grandfather     lung  . Hypertension Paternal Grandmother   . Hyperlipidemia Paternal Grandmother   . Heart disease Paternal Grandmother   . Stroke Paternal Grandfather     Allergies  Allergen Reactions  . Codeine Nausea And Vomiting  . Prenatal Vitamins Hives    Patient does not remember the brand of the prenatal vitamin that caused her to have issues.   Sarina Ill [Bactrim] Hives    Medication list has  been reviewed and updated.  Current Outpatient Prescriptions on File Prior to Visit  Medication Sig Dispense Refill  . acetaminophen (TYLENOL) 500 MG tablet Take 1,000 mg by mouth as needed. For pain/headaches.      . Adapalene-Benzoyl Peroxide (EPIDUO) 0.1-2.5 % gel Apply 1 application topically at bedtime.  135 g  3  . amphetamine-dextroamphetamine (ADDERALL) 20 MG tablet Take 1 tablet (20 mg total) by mouth 2 (two) times daily.  60 tablet  0  . aspirin-acetaminophen-caffeine (EXCEDRIN MIGRAINE) 664-403-47 MG per tablet Take 1 tablet by mouth every 6 (six) hours as needed. For migraine.      . Calcium Carbonate-Vitamin D (CALCIUM 600+D) 600-400 MG-UNIT per tablet Take 1 tablet by mouth 2  (two) times daily.      Marland Kitchen Co-Enzyme Q-10 100 MG CAPS Take 300 mg by mouth daily.      . Dapsone 5 % topical gel Apply 1 application topically 2 (two) times daily.  180 g  3  . doxycycline (ORACEA) 40 MG capsule Take 1 capsule (40 mg total) by mouth every morning.  90 capsule  3  . Flaxseed, Linseed, 1300 MG CAPS Take 1 capsule by mouth 2 (two) times daily.      Marland Kitchen ibuprofen (ADVIL,MOTRIN) 200 MG tablet Take 200-800 mg by mouth as needed. For pain/headaches.      . Multiple Vitamin (MULTIVITAMIN WITH MINERALS) TABS Take 1 tablet by mouth daily.      Marland Kitchen NASONEX 50 MCG/ACT nasal spray INSTILL 2 SPRAYS IN EACH NOSTRIL DAILY  51 g  2  . Olopatadine HCl (PATADAY) 0.2 % SOLN Place 1 drop into both eyes as needed. For dry/itchy eyes or allergies.  7.5 mL  3  . Omega-3 Fatty Acids (FISH OIL) 1200 MG CAPS Take 1 capsule by mouth daily.      . polyethylene glycol (MIRALAX / GLYCOLAX) packet Take 17 g by mouth daily.       No current facility-administered medications on file prior to visit.    Review of Systems:  As per HPI- otherwise negative.   Physical Examination: Filed Vitals:   11/13/13 1151  BP: 106/74  Pulse: 90  Temp: 98.6 F (37 C)  Resp: 16   Filed Vitals:   11/13/13 1151  Height: 5\' 1"  (1.549 m)  Weight: 113 lb 9.6 oz (51.529 kg)   Body mass index is 21.48 kg/(m^2). Ideal Body Weight: Weight in (lb) to have BMI = 25: 132  GEN: WDWN, NAD, Non-toxic, A & O x , looks well, slim build HEENT: Atraumatic, Normocephalic. Neck supple. No masses, No LAD. Bilateral TM wnl, oropharynx normal.  PEERL,EOMI.  Unable to appreciate any abnormality in her neck.  It seems she is palpating the normal cartilage in her neck  Ears and Nose: No external deformity. CV: RRR, No M/G/R. No JVD. No thrill. No extra heart sounds. PULM: CTA B, no wheezes, crackles, rhonchi. No retractions. No resp. distress. No accessory muscle use. EXTR: No c/c/e NEURO Normal gait.  PSYCH: Normally interactive.  Conversant. Not depressed or anxious appearing.  Calm demeanor.    Assessment and Plan: Palpable mass of neck - Plan: US Soft Tissue Head/Neck, TSH  Suspect a normal neck, but will set up an ultrasound as she is concerned and feels that she has noted an abnormality.  Follow-up pending labs and ultrasound  Signed Lamar Blinks, MD

## 2013-11-18 ENCOUNTER — Ambulatory Visit
Admission: RE | Admit: 2013-11-18 | Discharge: 2013-11-18 | Disposition: A | Discharge status: Home / Self Care | Payer: 59 | Source: Ambulatory Visit | Attending: Family Medicine | Admitting: Family Medicine

## 2013-11-18 DIAGNOSIS — R221 Localized swelling, mass and lump, neck: Secondary | ICD-10-CM

## 2013-11-20 ENCOUNTER — Encounter: Payer: Self-pay | Admitting: Family Medicine

## 2013-11-20 DIAGNOSIS — R221 Localized swelling, mass and lump, neck: Secondary | ICD-10-CM

## 2013-11-23 ENCOUNTER — Other Ambulatory Visit: Payer: Self-pay | Admitting: Family Medicine

## 2013-11-23 NOTE — Telephone Encounter (Signed)
Message copied by [name] on Mon Nov 23, 2013  4:12 PM ------      Message from: [name]      Created: Sat Nov 21, 2013  1:03 PM       Needs Ct scheduled for her neck ------

## 2013-11-23 NOTE — Progress Notes (Signed)
error 

## 2013-11-30 ENCOUNTER — Ambulatory Visit (HOSPITAL_COMMUNITY)
Admission: RE | Admit: 2013-11-30 | Discharge: 2013-11-30 | Disposition: A | Discharge status: Home / Self Care | Payer: 59 | Source: Ambulatory Visit | Attending: Family Medicine | Admitting: Family Medicine

## 2013-11-30 ENCOUNTER — Encounter (HOSPITAL_COMMUNITY): Payer: Self-pay

## 2013-11-30 DIAGNOSIS — M47812 Spondylosis without myelopathy or radiculopathy, cervical region: Secondary | ICD-10-CM | POA: Insufficient documentation

## 2013-11-30 DIAGNOSIS — R221 Localized swelling, mass and lump, neck: Secondary | ICD-10-CM

## 2013-11-30 DIAGNOSIS — Z1389 Encounter for screening for other disorder: Secondary | ICD-10-CM | POA: Insufficient documentation

## 2013-11-30 MED ORDER — IOHEXOL 300 MG/ML  SOLN
100.0000 mL | Freq: Once | INTRAMUSCULAR | Status: AC | PRN
  Administered 2013-11-30: 100 mL via INTRAVENOUS

## 2013-12-01 ENCOUNTER — Encounter: Payer: Self-pay | Admitting: Family Medicine

## 2013-12-10 ENCOUNTER — Other Ambulatory Visit: Payer: Self-pay | Admitting: Neurosurgery

## 2013-12-18 ENCOUNTER — Encounter: Payer: Self-pay | Admitting: Family Medicine

## 2013-12-18 DIAGNOSIS — F988 Other specified behavioral and emotional disorders with onset usually occurring in childhood and adolescence: Secondary | ICD-10-CM

## 2014-01-19 MED ORDER — AMPHETAMINE-DEXTROAMPHETAMINE 20 MG PO TABS: 20.0000 mg | ORAL_TABLET | Freq: Two times a day (BID) | ORAL | Status: DC

## 2014-01-19 NOTE — Telephone Encounter (Signed)
Refilled for 3 months today

## 2014-02-08 ENCOUNTER — Other Ambulatory Visit: Payer: Self-pay | Admitting: Family Medicine

## 2014-02-09 ENCOUNTER — Encounter: Payer: Self-pay | Admitting: Family Medicine

## 2014-02-09 NOTE — Telephone Encounter (Signed)
Dr Lorelei Pont, do you want to give RFs of these?

## 2014-02-10 ENCOUNTER — Telehealth: Payer: Self-pay

## 2014-02-10 DIAGNOSIS — L700 Acne vulgaris: Secondary | ICD-10-CM

## 2014-02-10 NOTE — Telephone Encounter (Signed)
Pharm sent req for RF of Oracea 40 mg capsules. Dr Lorelei Pont it looks like you Rxd this for pt about a year ago. You saw pt for something else in Jan. Do you want to RF or have pt RTC?

## 2014-02-11 MED ORDER — DOXYCYCLINE 40 MG PO CPDR: 40.0000 mg | DELAYED_RELEASE_CAPSULE | ORAL | Status: DC

## 2014-03-07 DIAGNOSIS — M2578 Osteophyte, vertebrae: Secondary | ICD-10-CM | POA: Insufficient documentation

## 2014-04-07 ENCOUNTER — Encounter: Payer: Self-pay | Admitting: Family Medicine

## 2014-04-09 ENCOUNTER — Ambulatory Visit (INDEPENDENT_AMBULATORY_CARE_PROVIDER_SITE_OTHER): Payer: 59 | Admitting: Family Medicine

## 2014-04-09 VITALS — BP 124/76 | HR 83 | Temp 98.3°F | Resp 16 | Ht 60.5 in | Wt 113.2 lb

## 2014-04-09 DIAGNOSIS — L708 Other acne: Secondary | ICD-10-CM

## 2014-04-09 DIAGNOSIS — F988 Other specified behavioral and emotional disorders with onset usually occurring in childhood and adolescence: Secondary | ICD-10-CM

## 2014-04-09 DIAGNOSIS — H1045 Other chronic allergic conjunctivitis: Secondary | ICD-10-CM

## 2014-04-09 DIAGNOSIS — Z01818 Encounter for other preprocedural examination: Secondary | ICD-10-CM

## 2014-04-09 DIAGNOSIS — J309 Allergic rhinitis, unspecified: Secondary | ICD-10-CM

## 2014-04-09 DIAGNOSIS — Z0000 Encounter for general adult medical examination without abnormal findings: Secondary | ICD-10-CM

## 2014-04-09 DIAGNOSIS — H1010 Acute atopic conjunctivitis, unspecified eye: Secondary | ICD-10-CM

## 2014-04-09 DIAGNOSIS — L700 Acne vulgaris: Secondary | ICD-10-CM

## 2014-04-09 LAB — CBC
HCT: 40.7 % (ref 36.0–46.0)
Hemoglobin: 14.2 g/dL (ref 12.0–15.0)
MCH: 29.1 pg (ref 26.0–34.0)
MCHC: 34.9 g/dL (ref 30.0–36.0)
MCV: 83.4 fL (ref 78.0–100.0)
PLATELETS: 218 10*3/uL (ref 150–400)
RBC: 4.88 MIL/uL (ref 3.87–5.11)
RDW: 12.8 % (ref 11.5–15.5)
WBC: 4.7 10*3/uL (ref 4.0–10.5)

## 2014-04-09 MED ORDER — AMPHETAMINE-DEXTROAMPHETAMINE 20 MG PO TABS: 20.0000 mg | ORAL_TABLET | Freq: Two times a day (BID) | ORAL | Status: DC

## 2014-04-09 MED ORDER — OLOPATADINE HCL 0.2 % OP SOLN: 1.0000 [drp] | OPHTHALMIC | Status: DC | PRN

## 2014-04-09 MED ORDER — MOMETASONE FUROATE 50 MCG/ACT NA SUSP: NASAL | Status: DC

## 2014-04-09 MED ORDER — DOXYCYCLINE 40 MG PO CPDR: 40.0000 mg | DELAYED_RELEASE_CAPSULE | ORAL | Status: DC

## 2014-04-09 NOTE — Progress Notes (Signed)
[facility] [address], [place] [address] [phone]  Date:  04/09/2014   Name:  Tiffany Parker   DOB:  1966-09-30   MRN:  [id number]  PCP:  Lamar Blinks, MD    Chief Complaint: Annual Exam   History of Present Illness:  Tiffany Parker is a 48 y.o. very pleasant female patient who presents with the following:  Here today for a CPE.  Surgery planned on July 6th- Dr. Saintclair Halsted will shave off a piece of bone in her neck and fuse C5/6/7.   She has a pre-op planned June 29th.   She did have a DVT after neck surgery a couple of years ago.  May want to take prophylactic xarelto- Dr. Saintclair Halsted is going to discuss this with her.   She does see OBG in [place].  They also do her mammogram She is fasting today for labs.   She also needs a varicella titer and quantiferon gold TB test for clinicals.  She is working on her BSN- one semester to go Her Tdap is UTD.    She has excellent exercise tolerance, able to clean the house, carry groceries up stairs, etc.  No histoyr of chest pain, no history of any heart problems in the past.   Needs adderall refill today as well as her allergy meds.   Patient Active Problem List   Diagnosis Date Noted  . ADHD (attention deficit hyperactivity disorder) 12/22/2012  . Acne 12/22/2012    Past Medical History  Diagnosis Date  . Allergy   . Blood transfusion   . ADHD (attention deficit hyperactivity disorder)   . TMJ (dislocation of temporomandibular joint)   . PONV (postoperative nausea and vomiting)   . DVT (deep venous thrombosis) 04/2012    following spine surgery  . Arthritis   . Blood transfusion without reported diagnosis     Past Surgical History  Procedure Laterality Date  . Cholecystectomy    . Cesarean section    . Episiotomy   11/19/97  . Episiotomy repair  11/1997  . Eye surgery    . Anterior cervical decomp/discectomy fusion  05/05/2012    Procedure: ANTERIOR CERVICAL DECOMPRESSION/DISCECTOMY FUSION 1 LEVEL;   Surgeon: Elaina Hoops, MD;  Location: [facility];  Service: Neurosurgery;  Laterality: N/A;  Cervical four-five anterior cervical decompression with fusion interbody prothesis plating and bonegraft    History  Substance Use Topics  . Smoking status: Never Smoker   . Smokeless tobacco: Not on file  . Alcohol Use: Yes     Comment: socially    Family History  Problem Relation Age of Onset  . Cancer Mother   . Hypertension Father   . Hypothyroidism Father   . Diabetes Maternal Grandmother   . Stroke Maternal Grandmother   . Cancer Maternal Grandfather     lung  . Hypertension Paternal Grandmother   . Hyperlipidemia Paternal Grandmother   . Heart disease Paternal Grandmother   . Stroke Paternal Grandfather     Allergies  Allergen Reactions  . Codeine Nausea And Vomiting  . Prenatal Vitamins Hives    Patient does not remember the brand of the prenatal vitamin that caused her to have issues.   Sarina Ill [Bactrim] Hives    Medication list has been reviewed and updated.  Current Outpatient Prescriptions on File Prior to Visit  Medication Sig Dispense Refill  . acetaminophen (TYLENOL) 500 MG tablet Take 1,000 mg by mouth as needed. For pain/headaches.      Marland Kitchen  ACZONE 5 % topical gel APPLY 1 APPLICATION TOPICALLY 2 TIMES DAILY.  180 g  3  . amphetamine-dextroamphetamine (ADDERALL) 20 MG tablet Take 1 tablet (20 mg total) by mouth 2 (two) times daily. To fill 03/19/2014  60 tablet  0  . amphetamine-dextroamphetamine (ADDERALL) 20 MG tablet Take 1 tablet (20 mg total) by mouth 2 (two) times daily. To fill 02/18/2104  60 tablet  0  . amphetamine-dextroamphetamine (ADDERALL) 20 MG tablet Take 1 tablet (20 mg total) by mouth 2 (two) times daily.  60 tablet  0  . aspirin-acetaminophen-caffeine (EXCEDRIN MIGRAINE) 761-950-93 MG per tablet Take 1 tablet by mouth every 6 (six) hours as needed. For migraine.      . Calcium Carbonate-Vitamin D (CALCIUM 600+D) 600-400 MG-UNIT per tablet Take 1  tablet by mouth 2 (two) times daily.      Marland Kitchen Co-Enzyme Q-10 100 MG CAPS Take 300 mg by mouth daily.      Marland Kitchen doxycycline (ORACEA) 40 MG capsule Take 1 capsule (40 mg total) by mouth every morning.  90 capsule  3  . EPIDUO 0.1-2.5 % gel APPLY 1 APPLICATION TOPICALLY AT BEDTIME.  135 g  3  . Flaxseed, Linseed, 1300 MG CAPS Take 1 capsule by mouth 2 (two) times daily.      Marland Kitchen ibuprofen (ADVIL,MOTRIN) 200 MG tablet Take 200-800 mg by mouth as needed. For pain/headaches.      . Multiple Vitamin (MULTIVITAMIN WITH MINERALS) TABS Take 1 tablet by mouth daily.      Marland Kitchen NASONEX 50 MCG/ACT nasal spray INSTILL 2 SPRAYS IN EACH NOSTRIL DAILY  51 g  2  . Olopatadine HCl (PATADAY) 0.2 % SOLN Place 1 drop into both eyes as needed. For dry/itchy eyes or allergies.  7.5 mL  3  . Omega-3 Fatty Acids (FISH OIL) 1200 MG CAPS Take 1 capsule by mouth daily.      . polyethylene glycol (MIRALAX / GLYCOLAX) packet Take 17 g by mouth daily.       No current facility-administered medications on file prior to visit.    Review of Systems:  As per HPI- otherwise negative.   Physical Examination: Filed Vitals:   04/09/14 1033  BP: 124/76  Pulse: 83  Temp: 98.3 F (36.8 C)  Resp: 16   Filed Vitals:   04/09/14 1033  Height: 5' 0.5" (1.537 m)  Weight: 113 lb 3.2 oz (51.347 kg)   Body mass index is 21.74 kg/(m^2). Ideal Body Weight: Weight in (lb) to have BMI = 25: 129.9  GEN: WDWN, NAD, Non-toxic, A & O x 3, slim build, looks well HEENT: Atraumatic, Normocephalic. Neck supple. No masses, No LAD. Ears and Nose: No external deformity. CV: RRR, No M/G/R. No JVD. No thrill. No extra heart sounds. PULM: CTA B, no wheezes, crackles, rhonchi. No retractions. No resp. distress. No accessory muscle use. ABD: S, NT, ND. No rebound. No HSM. EXTR: No c/c/e NEURO Normal gait.  PSYCH: Normally interactive. Conversant. Not depressed or anxious appearing.  Calm demeanor.   EKG:  SR.    Assessment and Plan: Acne vulgaris -  Plan: doxycycline (ORACEA) 40 MG capsule  ADD (attention deficit disorder) - Plan: amphetamine-dextroamphetamine (ADDERALL) 20 MG tablet, amphetamine-dextroamphetamine (ADDERALL) 20 MG tablet, amphetamine-dextroamphetamine (ADDERALL) 20 MG tablet  Allergic rhinitis - Plan: mometasone (NASONEX) 50 MCG/ACT nasal spray  Allergic conjunctivitis - Plan: Olopatadine HCl (PATADAY) 0.2 % SOLN  Pre-operative exam - Plan: EKG 12-Lead  Physical exam - Plan: EKG 12-Lead, Varicella zoster antibody, IgG,  Quantiferon tb gold assay, CBC, Comprehensive metabolic panel, Lipid panel, EKG 12-Lead  Here today for a physical exam/labs and refills.   Did baseline EKG for her today (to have surgery next month).     Await labs.    Signed Lamar Blinks, MD

## 2014-04-09 NOTE — Patient Instructions (Signed)
Great to see you today as always!  I will be in touch with your labs- you can print out the varicella titer and quantiferon gold for school.

## 2014-04-10 ENCOUNTER — Encounter: Payer: Self-pay | Admitting: Family Medicine

## 2014-04-10 LAB — COMPREHENSIVE METABOLIC PANEL
ALBUMIN: 4.5 g/dL (ref 3.5–5.2)
ALK PHOS: 69 U/L (ref 39–117)
ALT: 11 U/L (ref 0–35)
AST: 15 U/L (ref 0–37)
BUN: 12 mg/dL (ref 6–23)
CO2: 25 mEq/L (ref 19–32)
Calcium: 9.2 mg/dL (ref 8.4–10.5)
Chloride: 103 mEq/L (ref 96–112)
Creat: 0.57 mg/dL (ref 0.50–1.10)
Glucose, Bld: 84 mg/dL (ref 70–99)
POTASSIUM: 4.6 meq/L (ref 3.5–5.3)
SODIUM: 138 meq/L (ref 135–145)
Total Bilirubin: 0.6 mg/dL (ref 0.2–1.2)
Total Protein: 6.9 g/dL (ref 6.0–8.3)

## 2014-04-10 LAB — LIPID PANEL
Cholesterol: 151 mg/dL (ref 0–200)
HDL: 75 mg/dL (ref >39)
LDL CALC: 65 mg/dL (ref 0–99)
Total CHOL/HDL Ratio: 2 Ratio
Triglycerides: 54 mg/dL (ref <150)
VLDL: 11 mg/dL (ref 0–40)

## 2014-04-10 LAB — VARICELLA ZOSTER ANTIBODY, IGG: VARICELLA IGG: 3647 {index} — AB (ref <135.00)

## 2014-04-14 ENCOUNTER — Encounter: Payer: Self-pay | Admitting: Family Medicine

## 2014-04-14 ENCOUNTER — Telehealth: Payer: Self-pay | Admitting: *Deleted

## 2014-04-14 DIAGNOSIS — Z0000 Encounter for general adult medical examination without abnormal findings: Secondary | ICD-10-CM

## 2014-04-14 NOTE — Telephone Encounter (Signed)
Wrong lavender tube was sent out for the TB gold test.  Pt and Dr. Lorelei Pont notified.  Pt will be in on Monday 04/19/14 for redraw.

## 2014-04-23 ENCOUNTER — Encounter (HOSPITAL_COMMUNITY): Payer: Self-pay | Admitting: Pharmacy Technician

## 2014-04-23 NOTE — Pre-Procedure Instructions (Signed)
Tiffany Parker  04/23/2014   Your procedure is scheduled on:  Mon, July 6 @ 7:30 AM  Report to [facility]  at 5:30 AM.  Call this number if you have problems the morning of surgery: [phone]   Remember:   Do not eat food or drink liquids after midnight.   Take these medicines the morning of surgery with A SIP OF WATER: Adderall(Amphetamine-Dextoamphetamine),and Nasonsex(Mometasone)                Stop taking your Fish Oil,Ibprofen,CO Q10,and Excedrin Migraine. No Goody's,BC's,Aleve,or any Herbal Medications   Do not wear jewelry, make-up or nail polish.  Do not wear lotions, powders, or perfumes. You may wear deodorant.  Do not shave 48 hours prior to surgery.   Do not bring valuables to the hospital.  [facility] is not responsible                  for any belongings or valuables.               Contacts, dentures or bridgework may not be worn into surgery.  Leave suitcase in the car. After surgery it may be brought to your room.  For patients admitted to the hospital, discharge time is determined by your                treatment team.               Patients discharged the day of surgery will not be allowed to drive  home.    Special Instructions:  [place] [facility] - Preparing for Surgery  Before surgery, you can play an important role.  Because skin is not sterile, your skin needs to be as free of germs as possible.  You can reduce the number of germs on you skin by washing with CHG (chlorahexidine gluconate) soap before surgery.  CHG is an antiseptic cleaner which kills germs and bonds with the skin to continue killing germs even after washing.  Please DO NOT use if you have an allergy to CHG or antibacterial soaps.  If your skin becomes reddened/irritated stop using the CHG and inform your nurse when you arrive at Short Stay.  Do not shave (including legs and underarms) for at least 48 hours prior to the first CHG shower.  You may shave your face.  Please follow these  instructions carefully:   1.  Shower with CHG Soap the night before surgery and the                                morning of Surgery.  2.  If you choose to wash your hair, wash your hair first as usual with your       normal shampoo.  3.  After you shampoo, rinse your hair and body thoroughly to remove the                      Shampoo.  4.  Use CHG as you would any other liquid soap.  You can apply chg directly       to the skin and wash gently with scrungie or a clean washcloth.  5.  Apply the CHG Soap to your body ONLY FROM THE NECK DOWN.        Do not use on open wounds or open sores.  Avoid contact with your eyes,  ears, mouth and genitals (private parts).  Wash genitals (private parts)       with your normal soap.  6.  Wash thoroughly, paying special attention to the area where your surgery        will be performed.  7.  Thoroughly rinse your body with warm water from the neck down.  8.  DO NOT shower/wash with your normal soap after using and rinsing off       the CHG Soap.  9.  Pat yourself dry with a clean towel.            10.  Wear clean pajamas.            11.  Place clean sheets on your bed the night of your first shower and do not        sleep with pets.  Day of Surgery  Do not apply any lotions/deoderants the morning of surgery.  Please wear clean clothes to the hospital/surgery center.     Please read over the following fact sheets that you were given: Pain Booklet, Coughing and Deep Breathing, MRSA Information and Surgical Site Infection Prevention

## 2014-04-26 ENCOUNTER — Other Ambulatory Visit (INDEPENDENT_AMBULATORY_CARE_PROVIDER_SITE_OTHER): Payer: 59 | Admitting: *Deleted

## 2014-04-26 ENCOUNTER — Encounter (HOSPITAL_COMMUNITY)
Admission: RE | Admit: 2014-04-26 | Discharge: 2014-04-26 | Disposition: A | Discharge status: Home / Self Care | Payer: 59 | Source: Ambulatory Visit | Attending: Neurosurgery | Admitting: Neurosurgery

## 2014-04-26 ENCOUNTER — Encounter (HOSPITAL_COMMUNITY): Payer: Self-pay

## 2014-04-26 DIAGNOSIS — Z01812 Encounter for preprocedural laboratory examination: Secondary | ICD-10-CM | POA: Insufficient documentation

## 2014-04-26 DIAGNOSIS — Z0000 Encounter for general adult medical examination without abnormal findings: Secondary | ICD-10-CM

## 2014-04-26 HISTORY — DX: Headache: R51

## 2014-04-26 LAB — SURGICAL PCR SCREEN
MRSA, PCR: NEGATIVE
Staphylococcus aureus: NEGATIVE

## 2014-04-26 LAB — BASIC METABOLIC PANEL
BUN: 15 mg/dL (ref 6–23)
CALCIUM: 9.7 mg/dL (ref 8.4–10.5)
CO2: 26 meq/L (ref 19–32)
Chloride: 100 mEq/L (ref 96–112)
Creatinine, Ser: 0.64 mg/dL (ref 0.50–1.10)
GFR calc Af Amer: >90 mL/min (ref >90)
GFR calc non Af Amer: >90 mL/min (ref >90)
GLUCOSE: 106 mg/dL — AB (ref 70–99)
Potassium: 4.7 mEq/L (ref 3.7–5.3)
SODIUM: 139 meq/L (ref 137–147)

## 2014-04-26 LAB — CBC
HCT: 42.1 % (ref 36.0–46.0)
Hemoglobin: 14.3 g/dL (ref 12.0–15.0)
MCH: 30 pg (ref 26.0–34.0)
MCHC: 34 g/dL (ref 30.0–36.0)
MCV: 88.3 fL (ref 78.0–100.0)
Platelets: 233 10*3/uL (ref 150–400)
RBC: 4.77 MIL/uL (ref 3.87–5.11)
RDW: 12.4 % (ref 11.5–15.5)
WBC: 5.1 10*3/uL (ref 4.0–10.5)

## 2014-04-26 LAB — HCG, SERUM, QUALITATIVE: Preg, Serum: NEGATIVE

## 2014-04-26 NOTE — Progress Notes (Signed)
Pt here for blood draw only  

## 2014-04-26 NOTE — Progress Notes (Signed)
Primary - [facility]eland ekg in epic

## 2014-04-27 ENCOUNTER — Encounter: Payer: Self-pay | Admitting: Family Medicine

## 2014-04-27 DIAGNOSIS — Z299 Encounter for prophylactic measures, unspecified: Secondary | ICD-10-CM

## 2014-04-27 LAB — QUANTIFERON TB GOLD ASSAY (BLOOD)
Interferon Gamma Release Assay: NEGATIVE
Mitogen value: >10 IU/mL
QUANTIFERON NIL VALUE: 0.02 [IU]/mL
Quantiferon Tb Ag Minus Nil Value: 0 IU/mL
TB Ag value: 0.02 IU/mL

## 2014-05-01 ENCOUNTER — Encounter: Payer: Self-pay | Admitting: Family Medicine

## 2014-05-01 MED ORDER — RIVAROXABAN 10 MG PO TABS: 10.0000 mg | ORAL_TABLET | Freq: Every day | ORAL | Status: DC

## 2014-05-01 NOTE — Telephone Encounter (Signed)
Message copied by Lamar Blinks C on Sat May 01, 2014  8:45 AM ------      Message from: Lamar Blinks C      Created: Wed Apr 28, 2014 10:08 PM      Regarding: Axtell called in ------

## 2014-05-03 ENCOUNTER — Encounter (HOSPITAL_COMMUNITY): Payer: 59 | Admitting: Anesthesiology

## 2014-05-03 ENCOUNTER — Ambulatory Visit (HOSPITAL_COMMUNITY): Payer: 59 | Admitting: Anesthesiology

## 2014-05-03 ENCOUNTER — Encounter (HOSPITAL_COMMUNITY)
Admission: RE | Disposition: A | Discharge status: Home / Self Care | Payer: Self-pay | Source: Ambulatory Visit | Attending: Neurosurgery

## 2014-05-03 ENCOUNTER — Encounter (HOSPITAL_COMMUNITY): Payer: Self-pay | Admitting: *Deleted

## 2014-05-03 ENCOUNTER — Observation Stay (HOSPITAL_COMMUNITY)
Admission: RE | Admit: 2014-05-03 | Discharge: 2014-05-03 | Disposition: A | Discharge status: Home / Self Care | Payer: 59 | Source: Ambulatory Visit | Attending: Neurosurgery | Admitting: Neurosurgery

## 2014-05-03 ENCOUNTER — Ambulatory Visit (HOSPITAL_COMMUNITY): Payer: 59

## 2014-05-03 DIAGNOSIS — Z792 Long term (current) use of antibiotics: Secondary | ICD-10-CM | POA: Insufficient documentation

## 2014-05-03 DIAGNOSIS — M47812 Spondylosis without myelopathy or radiculopathy, cervical region: Principal | ICD-10-CM | POA: Insufficient documentation

## 2014-05-03 DIAGNOSIS — Z7901 Long term (current) use of anticoagulants: Secondary | ICD-10-CM | POA: Insufficient documentation

## 2014-05-03 DIAGNOSIS — F909 Attention-deficit hyperactivity disorder, unspecified type: Secondary | ICD-10-CM | POA: Insufficient documentation

## 2014-05-03 DIAGNOSIS — Z86718 Personal history of other venous thrombosis and embolism: Secondary | ICD-10-CM | POA: Insufficient documentation

## 2014-05-03 DIAGNOSIS — R51 Headache: Secondary | ICD-10-CM | POA: Insufficient documentation

## 2014-05-03 DIAGNOSIS — M5020 Other cervical disc displacement, unspecified cervical region: Secondary | ICD-10-CM | POA: Insufficient documentation

## 2014-05-03 DIAGNOSIS — Z981 Arthrodesis status: Secondary | ICD-10-CM | POA: Insufficient documentation

## 2014-05-03 DIAGNOSIS — M4802 Spinal stenosis, cervical region: Secondary | ICD-10-CM | POA: Diagnosis present

## 2014-05-03 HISTORY — PX: ANTERIOR CERVICAL DECOMP/DISCECTOMY FUSION: SHX1161

## 2014-05-03 SURGERY — ANTERIOR CERVICAL DECOMPRESSION/DISCECTOMY FUSION 2 LEVELS
Anesthesia: General

## 2014-05-03 MED ORDER — PROPOFOL 10 MG/ML IV BOLUS
INTRAVENOUS | Status: AC
  Filled 2014-05-03: qty 20

## 2014-05-03 MED ORDER — SCOPOLAMINE 1 MG/3DAYS TD PT72
MEDICATED_PATCH | TRANSDERMAL | Status: AC
  Filled 2014-05-03: qty 1

## 2014-05-03 MED ORDER — CYCLOBENZAPRINE HCL 10 MG PO TABS
10.0000 mg | ORAL_TABLET | Freq: Three times a day (TID) | ORAL | Status: DC | PRN
Start: 2014-05-03 — End: 2014-05-03
  Administered 2014-05-03: 10 mg via ORAL

## 2014-05-03 MED ORDER — DOXYCYCLINE 40 MG PO CPDR: 40.0000 mg | DELAYED_RELEASE_CAPSULE | Freq: Every day | ORAL | Status: DC

## 2014-05-03 MED ORDER — GLYCOPYRROLATE 0.2 MG/ML IJ SOLN
INTRAMUSCULAR | Status: AC
  Filled 2014-05-03: qty 2

## 2014-05-03 MED ORDER — B COMPLEX-C PO TABS: 1.0000 | ORAL_TABLET | Freq: Every day | ORAL | Status: DC

## 2014-05-03 MED ORDER — SCOPOLAMINE 1 MG/3DAYS TD PT72
MEDICATED_PATCH | TRANSDERMAL | Status: DC | PRN
  Administered 2014-05-03: 1 via TRANSDERMAL

## 2014-05-03 MED ORDER — THROMBIN 5000 UNITS EX SOLR
OROMUCOSAL | Status: DC | PRN
  Administered 2014-05-03: 08:00:00 via TOPICAL

## 2014-05-03 MED ORDER — CALCIUM CARBONATE-VITAMIN D 600-400 MG-UNIT PO TABS: 1.0000 | ORAL_TABLET | Freq: Two times a day (BID) | ORAL | Status: DC

## 2014-05-03 MED ORDER — NEOSTIGMINE METHYLSULFATE 10 MG/10ML IV SOLN
INTRAVENOUS | Status: DC | PRN
  Administered 2014-05-03: 3 mg via INTRAVENOUS

## 2014-05-03 MED ORDER — FENTANYL CITRATE 0.05 MG/ML IJ SOLN
INTRAMUSCULAR | Status: DC | PRN
  Administered 2014-05-03: 100 ug via INTRAVENOUS
  Administered 2014-05-03 (×2): 50 ug via INTRAVENOUS
  Administered 2014-05-03: 100 ug via INTRAVENOUS
  Administered 2014-05-03: 50 ug via INTRAVENOUS

## 2014-05-03 MED ORDER — LIDOCAINE HCL (CARDIAC) 20 MG/ML IV SOLN
INTRAVENOUS | Status: DC | PRN
  Administered 2014-05-03: 50 mg via INTRAVENOUS

## 2014-05-03 MED ORDER — FENTANYL CITRATE 0.05 MG/ML IJ SOLN
INTRAMUSCULAR | Status: AC
  Filled 2014-05-03: qty 5

## 2014-05-03 MED ORDER — NEOSTIGMINE METHYLSULFATE 10 MG/10ML IV SOLN
INTRAVENOUS | Status: AC
  Filled 2014-05-03: qty 1

## 2014-05-03 MED ORDER — PHENOL 1.4 % MT LIQD
1.0000 | OROMUCOSAL | Status: DC | PRN
  Administered 2014-05-03: 1 via OROMUCOSAL
  Filled 2014-05-03: qty 177

## 2014-05-03 MED ORDER — OXYCODONE-ACETAMINOPHEN 5-325 MG PO TABS
1.0000 | ORAL_TABLET | ORAL | Status: DC | PRN
  Administered 2014-05-03: 1 via ORAL
  Filled 2014-05-03: qty 1

## 2014-05-03 MED ORDER — CEFAZOLIN SODIUM-DEXTROSE 2-3 GM-% IV SOLR
INTRAVENOUS | Status: DC | PRN
  Administered 2014-05-03: 2 g via INTRAVENOUS

## 2014-05-03 MED ORDER — B COMPLEX PO TABS: 1.0000 | ORAL_TABLET | Freq: Every day | ORAL | Status: DC

## 2014-05-03 MED ORDER — AMPHETAMINE-DEXTROAMPHETAMINE 10 MG PO TABS
20.0000 mg | ORAL_TABLET | ORAL | Status: DC
  Administered 2014-05-03: 20 mg via ORAL

## 2014-05-03 MED ORDER — HEMOSTATIC AGENTS (NO CHARGE) OPTIME
TOPICAL | Status: DC | PRN
  Administered 2014-05-03: 1 via TOPICAL

## 2014-05-03 MED ORDER — AMPHETAMINE-DEXTROAMPHETAMINE 10 MG PO TABS
20.0000 mg | ORAL_TABLET | Freq: Two times a day (BID) | ORAL | Status: DC
  Filled 2014-05-03: qty 2

## 2014-05-03 MED ORDER — ONDANSETRON HCL 4 MG/2ML IJ SOLN
INTRAMUSCULAR | Status: DC | PRN
  Administered 2014-05-03 (×2): 4 mg via INTRAVENOUS

## 2014-05-03 MED ORDER — ACETAMINOPHEN 650 MG RE SUPP: 650.0000 mg | RECTAL | Status: DC | PRN

## 2014-05-03 MED ORDER — HYDROMORPHONE HCL PF 1 MG/ML IJ SOLN: 0.5000 mg | INTRAMUSCULAR | Status: DC | PRN

## 2014-05-03 MED ORDER — ALUM & MAG HYDROXIDE-SIMETH 200-200-20 MG/5ML PO SUSP: 30.0000 mL | Freq: Four times a day (QID) | ORAL | Status: DC | PRN

## 2014-05-03 MED ORDER — GLYCOPYRROLATE 0.2 MG/ML IJ SOLN
INTRAMUSCULAR | Status: DC | PRN
  Administered 2014-05-03: 0.4 mg via INTRAVENOUS

## 2014-05-03 MED ORDER — ADULT MULTIVITAMIN W/MINERALS CH
1.0000 | ORAL_TABLET | Freq: Every day | ORAL | Status: DC
  Filled 2014-05-03: qty 1

## 2014-05-03 MED ORDER — ASPIRIN-ACETAMINOPHEN-CAFFEINE 250-250-65 MG PO TABS
1.0000 | ORAL_TABLET | Freq: Every day | ORAL | Status: DC | PRN
  Filled 2014-05-03: qty 1

## 2014-05-03 MED ORDER — MIDAZOLAM HCL 2 MG/2ML IJ SOLN
INTRAMUSCULAR | Status: AC
  Filled 2014-05-03: qty 2

## 2014-05-03 MED ORDER — ONDANSETRON HCL 4 MG/2ML IJ SOLN
INTRAMUSCULAR | Status: AC
  Filled 2014-05-03: qty 2

## 2014-05-03 MED ORDER — FENTANYL CITRATE 0.05 MG/ML IJ SOLN
INTRAMUSCULAR | Status: DC
Start: 2014-05-03 — End: 2014-05-03
  Filled 2014-05-03: qty 2

## 2014-05-03 MED ORDER — CYCLOBENZAPRINE HCL 10 MG PO TABS
ORAL_TABLET | ORAL | Status: AC
  Filled 2014-05-03: qty 1

## 2014-05-03 MED ORDER — EPHEDRINE SULFATE 50 MG/ML IJ SOLN
INTRAMUSCULAR | Status: AC
  Filled 2014-05-03: qty 1

## 2014-05-03 MED ORDER — POLYETHYLENE GLYCOL 3350 17 G PO PACK
17.0000 g | PACK | Freq: Every day | ORAL | Status: DC | PRN
  Filled 2014-05-03: qty 1

## 2014-05-03 MED ORDER — SODIUM CHLORIDE 0.9 % IJ SOLN: 3.0000 mL | INTRAMUSCULAR | Status: DC | PRN

## 2014-05-03 MED ORDER — HYDROCODONE-ACETAMINOPHEN 5-325 MG PO TABS: 1.0000 | ORAL_TABLET | Freq: Four times a day (QID) | ORAL | Status: DC | PRN

## 2014-05-03 MED ORDER — RIVAROXABAN 10 MG PO TABS
10.0000 mg | ORAL_TABLET | Freq: Every day | ORAL | Status: DC
  Filled 2014-05-03: qty 1

## 2014-05-03 MED ORDER — FLUTICASONE PROPIONATE 50 MCG/ACT NA SUSP
1.0000 | Freq: Every day | NASAL | Status: DC
  Filled 2014-05-03: qty 16

## 2014-05-03 MED ORDER — DAPSONE 5 % EX GEL: 1.0000 "application " | Freq: Two times a day (BID) | CUTANEOUS | Status: DC

## 2014-05-03 MED ORDER — PHENYLEPHRINE 40 MCG/ML (10ML) SYRINGE FOR IV PUSH (FOR BLOOD PRESSURE SUPPORT)
PREFILLED_SYRINGE | INTRAVENOUS | Status: AC
  Filled 2014-05-03: qty 10

## 2014-05-03 MED ORDER — ROCURONIUM BROMIDE 100 MG/10ML IV SOLN
INTRAVENOUS | Status: DC | PRN
  Administered 2014-05-03: 10 mg via INTRAVENOUS
  Administered 2014-05-03: 20 mg via INTRAVENOUS
  Administered 2014-05-03: 40 mg via INTRAVENOUS

## 2014-05-03 MED ORDER — CEFAZOLIN SODIUM 1-5 GM-% IV SOLN
1.0000 g | Freq: Three times a day (TID) | INTRAVENOUS | Status: DC
  Administered 2014-05-03: 1 g via INTRAVENOUS
  Filled 2014-05-03 (×2): qty 50

## 2014-05-03 MED ORDER — SODIUM CHLORIDE 0.9 % IJ SOLN
INTRAMUSCULAR | Status: AC
  Filled 2014-05-03: qty 10

## 2014-05-03 MED ORDER — RIVAROXABAN 10 MG PO TABS: 10.0000 mg | ORAL_TABLET | Freq: Every day | ORAL | Status: DC

## 2014-05-03 MED ORDER — CEFAZOLIN SODIUM-DEXTROSE 2-3 GM-% IV SOLR
INTRAVENOUS | Status: AC
  Filled 2014-05-03: qty 50

## 2014-05-03 MED ORDER — SODIUM CHLORIDE 0.9 % IJ SOLN
3.0000 mL | Freq: Two times a day (BID) | INTRAMUSCULAR | Status: DC
  Administered 2014-05-03: 3 mL via INTRAVENOUS

## 2014-05-03 MED ORDER — SODIUM CHLORIDE 0.9 % IR SOLN
Status: DC | PRN
  Administered 2014-05-03: 08:00:00

## 2014-05-03 MED ORDER — PROPOFOL 10 MG/ML IV BOLUS
INTRAVENOUS | Status: DC | PRN
  Administered 2014-05-03: 150 mg via INTRAVENOUS

## 2014-05-03 MED ORDER — DOXYCYCLINE CALCIUM 50 MG/5ML PO SYRP: 40.0000 mg | ORAL_SOLUTION | Freq: Every day | ORAL | Status: DC

## 2014-05-03 MED ORDER — CALCIUM CARBONATE-VITAMIN D 500-200 MG-UNIT PO TABS
1.0000 | ORAL_TABLET | Freq: Two times a day (BID) | ORAL | Status: DC
  Filled 2014-05-03 (×2): qty 1

## 2014-05-03 MED ORDER — MENTHOL 3 MG MT LOZG: 1.0000 | LOZENGE | OROMUCOSAL | Status: DC | PRN

## 2014-05-03 MED ORDER — THROMBIN 5000 UNITS EX SOLR
CUTANEOUS | Status: DC | PRN
  Administered 2014-05-03 (×2): 5000 [IU] via TOPICAL

## 2014-05-03 MED ORDER — HYDROMORPHONE HCL PF 1 MG/ML IJ SOLN
INTRAMUSCULAR | Status: AC
  Filled 2014-05-03: qty 1

## 2014-05-03 MED ORDER — MIDAZOLAM HCL 5 MG/5ML IJ SOLN
INTRAMUSCULAR | Status: DC | PRN
  Administered 2014-05-03 (×2): 1 mg via INTRAVENOUS

## 2014-05-03 MED ORDER — ROCURONIUM BROMIDE 50 MG/5ML IV SOLN
INTRAVENOUS | Status: AC
  Filled 2014-05-03: qty 1

## 2014-05-03 MED ORDER — SODIUM CHLORIDE 0.9 % IR SOLN
Status: DC | PRN
  Administered 2014-05-03: 1

## 2014-05-03 MED ORDER — ACETAMINOPHEN 325 MG PO TABS: 650.0000 mg | ORAL_TABLET | ORAL | Status: DC | PRN

## 2014-05-03 MED ORDER — METHOCARBAMOL 500 MG PO TABS: 500.0000 mg | ORAL_TABLET | Freq: Four times a day (QID) | ORAL | Status: DC | PRN

## 2014-05-03 MED ORDER — B COMPLEX-C PO TABS
1.0000 | ORAL_TABLET | Freq: Every day | ORAL | Status: DC
  Filled 2014-05-03: qty 1

## 2014-05-03 MED ORDER — DEXAMETHASONE SODIUM PHOSPHATE 4 MG/ML IJ SOLN
INTRAMUSCULAR | Status: DC | PRN
  Administered 2014-05-03: 8 mg via INTRAVENOUS

## 2014-05-03 MED ORDER — LIDOCAINE HCL (CARDIAC) 20 MG/ML IV SOLN
INTRAVENOUS | Status: AC
  Filled 2014-05-03: qty 5

## 2014-05-03 MED ORDER — ONDANSETRON HCL 4 MG/2ML IJ SOLN: 4.0000 mg | INTRAMUSCULAR | Status: DC | PRN

## 2014-05-03 MED ORDER — SUCCINYLCHOLINE CHLORIDE 20 MG/ML IJ SOLN
INTRAMUSCULAR | Status: AC
  Filled 2014-05-03: qty 1

## 2014-05-03 MED ORDER — LACTATED RINGERS IV SOLN
INTRAVENOUS | Status: DC | PRN
  Administered 2014-05-03 (×2): via INTRAVENOUS

## 2014-05-03 MED ORDER — DOCUSATE SODIUM 100 MG PO CAPS
100.0000 mg | ORAL_CAPSULE | Freq: Two times a day (BID) | ORAL | Status: DC
  Filled 2014-05-03 (×2): qty 1

## 2014-05-03 MED ORDER — ACETAMINOPHEN 500 MG PO TABS: 1000.0000 mg | ORAL_TABLET | Freq: Every day | ORAL | Status: DC | PRN

## 2014-05-03 MED ORDER — IBUPROFEN 600 MG PO TABS
600.0000 mg | ORAL_TABLET | Freq: Every day | ORAL | Status: DC | PRN
  Filled 2014-05-03: qty 1

## 2014-05-03 MED ORDER — FENTANYL CITRATE 0.05 MG/ML IJ SOLN
25.0000 ug | INTRAMUSCULAR | Status: DC | PRN
  Administered 2014-05-03: 25 ug via INTRAVENOUS

## 2014-05-03 SURGICAL SUPPLY — 68 items
BAG DECANTER FOR FLEXI CONT (MISCELLANEOUS) ×3 IMPLANT
BENZOIN TINCTURE PRP APPL 2/3 (GAUZE/BANDAGES/DRESSINGS) ×3 IMPLANT
BIT DRILL NEURO 2X3.1 SFT TUCH (MISCELLANEOUS) ×1 IMPLANT
BIT DRILL SPINE QC 12 (BIT) ×3 IMPLANT
BLADE 10 SAFETY STRL DISP (BLADE) ×3 IMPLANT
BONE CERV LORDOTIC 14.5X12X6 (Bone Implant) ×3 IMPLANT
BONE CERV LORDOTIC 14.5X12X7 (Bone Implant) ×3 IMPLANT
BRUSH SCRUB EZ PLAIN DRY (MISCELLANEOUS) ×3 IMPLANT
BUR MATCHSTICK NEURO 3.0 LAGG (BURR) ×3 IMPLANT
CANISTER SUCT 3000ML (MISCELLANEOUS) ×3 IMPLANT
CLOSURE WOUND 1/2 X4 (GAUZE/BANDAGES/DRESSINGS) ×1
CONT SPEC 4OZ CLIKSEAL STRL BL (MISCELLANEOUS) ×6 IMPLANT
DERMABOND ADVANCED (GAUZE/BANDAGES/DRESSINGS)
DERMABOND ADVANCED .7 DNX12 (GAUZE/BANDAGES/DRESSINGS) IMPLANT
DRAPE C-ARM 42X72 X-RAY (DRAPES) ×6 IMPLANT
DRAPE LAPAROTOMY 100X72 PEDS (DRAPES) ×3 IMPLANT
DRAPE MICROSCOPE ZEISS OPMI (DRAPES) ×3 IMPLANT
DRAPE POUCH INSTRU U-SHP 10X18 (DRAPES) ×3 IMPLANT
DRILL NEURO 2X3.1 SOFT TOUCH (MISCELLANEOUS) ×3
DRSG OPSITE POSTOP 4X6 (GAUZE/BANDAGES/DRESSINGS) ×3 IMPLANT
DURAPREP 6ML APPLICATOR 50/CS (WOUND CARE) ×3 IMPLANT
ELECT COATED BLADE 2.86 ST (ELECTRODE) ×3 IMPLANT
ELECT REM PT RETURN 9FT ADLT (ELECTROSURGICAL) ×3
ELECTRODE REM PT RTRN 9FT ADLT (ELECTROSURGICAL) ×1 IMPLANT
GAUZE SPONGE 4X4 16PLY XRAY LF (GAUZE/BANDAGES/DRESSINGS) IMPLANT
GLOVE BIO SURGEON STRL SZ8 (GLOVE) ×3 IMPLANT
GLOVE BIO SURGEON STRL SZ8.5 (GLOVE) ×3 IMPLANT
GLOVE BIOGEL PI IND STRL 7.0 (GLOVE) ×1 IMPLANT
GLOVE BIOGEL PI IND STRL 7.5 (GLOVE) ×1 IMPLANT
GLOVE BIOGEL PI INDICATOR 7.0 (GLOVE) ×2
GLOVE BIOGEL PI INDICATOR 7.5 (GLOVE) ×2
GLOVE EXAM NITRILE LRG STRL (GLOVE) IMPLANT
GLOVE EXAM NITRILE MD LF STRL (GLOVE) IMPLANT
GLOVE EXAM NITRILE XL STR (GLOVE) IMPLANT
GLOVE EXAM NITRILE XS STR PU (GLOVE) IMPLANT
GLOVE INDICATOR 8.5 STRL (GLOVE) ×3 IMPLANT
GLOVE SS BIOGEL STRL SZ 8 (GLOVE) ×1 IMPLANT
GLOVE SUPERSENSE BIOGEL SZ 8 (GLOVE) ×2
GLOVE SURG SS PI 7.0 STRL IVOR (GLOVE) ×9 IMPLANT
GOWN STRL REUS W/ TWL XL LVL3 (GOWN DISPOSABLE) ×4 IMPLANT
GOWN STRL REUS W/TWL 2XL LVL3 (GOWN DISPOSABLE) IMPLANT
GOWN STRL REUS W/TWL XL LVL3 (GOWN DISPOSABLE) ×8
HALTER HD/CHIN CERV TRACTION D (MISCELLANEOUS) ×3 IMPLANT
HEMOSTAT POWDER KIT SURGIFOAM (HEMOSTASIS) ×3 IMPLANT
KIT BASIN OR (CUSTOM PROCEDURE TRAY) ×3 IMPLANT
KIT ROOM TURNOVER OR (KITS) ×3 IMPLANT
NEEDLE SPNL 20GX3.5 QUINCKE YW (NEEDLE) ×3 IMPLANT
NS IRRIG 1000ML POUR BTL (IV SOLUTION) ×3 IMPLANT
PACK LAMINECTOMY NEURO (CUSTOM PROCEDURE TRAY) ×3 IMPLANT
PAD ARMBOARD 7.5X6 YLW CONV (MISCELLANEOUS) ×9 IMPLANT
PATTIES SURGICAL .5 X.5 (GAUZE/BANDAGES/DRESSINGS) ×3 IMPLANT
PLATE ANT CERV XTEND 3 LV 45 (Plate) ×3 IMPLANT
RUBBERBAND STERILE (MISCELLANEOUS) ×6 IMPLANT
SCREW SELF TAP VARIABLE 4.6X12 (Screw) ×12 IMPLANT
SCREW XTD VAR 4.2 SELF TAP 12 (Screw) ×12 IMPLANT
SPONGE GAUZE 4X4 12PLY (GAUZE/BANDAGES/DRESSINGS) ×3 IMPLANT
SPONGE INTESTINAL PEANUT (DISPOSABLE) ×6 IMPLANT
SPONGE SURGIFOAM ABS GEL SZ50 (HEMOSTASIS) ×3 IMPLANT
STRIP CLOSURE SKIN 1/2X4 (GAUZE/BANDAGES/DRESSINGS) ×2 IMPLANT
SUT VIC AB 3-0 SH 8-18 (SUTURE) ×3 IMPLANT
SUT VICRYL 4-0 PS2 18IN ABS (SUTURE) ×3 IMPLANT
SYR 20ML ECCENTRIC (SYRINGE) ×3 IMPLANT
TAPE CLOTH 4X10 WHT NS (GAUZE/BANDAGES/DRESSINGS) IMPLANT
TOWEL OR 17X24 6PK STRL BLUE (TOWEL DISPOSABLE) ×3 IMPLANT
TOWEL OR 17X26 10 PK STRL BLUE (TOWEL DISPOSABLE) ×3 IMPLANT
TRAP SPECIMEN MUCOUS 40CC (MISCELLANEOUS) ×3 IMPLANT
TRAY FOLEY CATH 14FRSI W/METER (CATHETERS) ×3 IMPLANT
WATER STERILE IRR 1000ML POUR (IV SOLUTION) ×3 IMPLANT

## 2014-05-03 NOTE — Plan of Care (Signed)
Problem: Consults Goal: Diagnosis - Spinal Surgery Cervical Spine Fusion     

## 2014-05-03 NOTE — Anesthesia Postprocedure Evaluation (Signed)
  Anesthesia Post-op Note  Patient: Tiffany Parker  Procedure(s) Performed: Procedure(s): CERVICAL FIVE TO SIX, CERVICAL SIX TO SEVEN ANTERIOR CERVICAL DECOMPRESSION/DISCECTOMY FUSION 2 LEVELS (N/A)  Patient Location: PACU  Anesthesia Type:General  Level of Consciousness: awake  Airway and Oxygen Therapy: Patient Spontanous Breathing  Post-op Pain: mild  Post-op Assessment: Post-op Vital signs reviewed  Post-op Vital Signs: Reviewed  Last Vitals:  Filed Vitals:   05/03/14 1102  BP: 108/62  Pulse: 64  Temp: 74.0 C    Complications: No apparent anesthesia complications

## 2014-05-03 NOTE — Op Note (Signed)
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  Postoperative diagnosis: Same  Procedure: Anterior cervical discectomies and fusion at C5-6 and C6-7 using allograft wedges and the globus titanium extend plate with 8- 12 mm screws including 2 rescue and resection of large lateral osteophyte C5-6  Surgeon: Dominica Severin Rishik Tubby  Assistant: Newman Pies  Anesthesia: Gen.  EBL: Minimal  History of present illness: Patient is a very pleasant 48 year old female undergone previous ACDF at C4-5 and done very well however some progress worsening neck pain and a large painful palpable hard mass in the anterior margin of her neck. Workup showed this to be a large osteotome of the C5-6 interspace extending through the uncinate displacing the posterior aspect of the jugular and carotid. In addition patient had aggressive spondylosis at C6-7 with by foraminal stenosis of the C7 nerve roots and evidence of instability at C5-6. To this mixture treatment imaging findings and progression of clinical syndrome I recommended anterior cervical discectomies and fusion with possible resection of a large anterior ossified 6 and sooner risks and benefits of the operation the patient as well as therapy course expectations about alternatives of surgery and she understood and agreed to proceed forward.  Operative procedure: Patient brought into the or was induced under general anesthesia positioned supine the neck in slight extension in 5 pounds of traction the rest of her neck prepped and draped in routine sterile fashion preoperative x-ray identified the appropriate level so a curvilinear incision was made just off midline to its border of sternomastoid and the superficial layer of the platysmas dissected and divided longitudinally the avascular plane to sternomastoid and strap as was the then fashioned professes acid with  Kitners. The plate was immediately identified to confirm appropriate level and then I was able to palpate the large anterior ossified coming off laterally the C6 and C5 interspace so I dissected lateral to the carotid lateral to the vagus by medial to the jugular breakdown on top of the osteophyte. I packed this with Gelfoam and cottonoid for later resection. I continue dissection around the anterior cervical spine reflected longus Cole reflected laterally and self-retaining retractor placed. The spaces were incised drilled down a high-speed drill and measured illumination aggressive and viable template allowed identification of the PLL which is what piecemeal fashion both levels first working at C5-6 and both C6 pedicles identified both C6 nerve roots, social pedicle this level was a markedly hypermobile there was a moderate size posterior ossified this was removed in piecemeal fashion. Then working at C6-7 there was a large posterior spur and large disc herniation displacing the right C7 nerve root this is all teased away skeletonizing the right C7 nerve root flush with pedicle aggressive and viable template decompress the central canal marking laterally to the left the left C7 pedicles and a half unless he 7 no results decompressed. Then both interspaces were copiously irrigated end plates were scraped and prepared to allografts were inserted one at C5-6-1 C6-7-7 millimeters C5-6 millimeters C6-7 then after all the allograft insertions taken to the resection of the osteophyte working again lateral to the carotid lateral to the vagus I carefully and sharply dissected the subcutaneous tissue of the osteophyte and in created soft tissue eggshell linear and removed in piecemeal fashion the osteophyte and 36 manner. Epi the resection there was no palpable mass lateral to the spine projecting anterior to the anterior spine again. This is impacted Gelfoam  to see her good fixing space was maintained and then attention  second plate placement the old plate was removed fusion did appear to be solid or decided put in a three-level plate regardless he is rescue screws and the old holes and drilled the holes and the screws excellent purchase all locking mechanisms were engaged wounds and copious irrigated meticulous in space was maintained on the wounds and closed in layers with after Vicryl the skin was) 4 subcuticular benzo and Steri-Strips were applied patient recovered in stable condition. At the end of case on it counts sponge counts were correct.

## 2014-05-03 NOTE — Transfer of Care (Signed)
Immediate Anesthesia Transfer of Care Note  Patient: Tiffany Parker  Procedure(s) Performed: Procedure(s): CERVICAL FIVE TO SIX, CERVICAL SIX TO SEVEN ANTERIOR CERVICAL DECOMPRESSION/DISCECTOMY FUSION 2 LEVELS (N/A)  Patient Location: PACU  Anesthesia Type:General  Level of Consciousness: sedated and patient cooperative  Airway & Oxygen Therapy: Patient Spontanous Breathing and Patient connected to nasal cannula oxygen  Post-op Assessment: Report given to PACU RN, Post -op Vital signs reviewed and stable and Patient moving all extremities X 4  Post vital signs: Reviewed and stable  Complications: No apparent anesthesia complications

## 2014-05-03 NOTE — Discharge Summary (Signed)
  Physician Discharge Summary  Patient ID: Tiffany Parker MRN: [id number] DOB/AGE: 46-Aug-1967 46 y.o.  Admit date: 05/03/2014 Discharge date: 05/03/2014  Admission Diagnoses:cervical stenosis C56, C67  Discharge Diagnoses: same Active Problems:   Spinal stenosis of cervical region   Discharged Condition: good  Hospital Course: doing great ambulating, viding and ready for discharge home.  No arm pain  Consults: Significant Diagnostic Studies: Treatments:ACDF c56, c67 Discharge Exam: Blood pressure 96/63, pulse 87, temperature 98.4 F (36.9 C), temperature source Oral, resp. rate 18, weight 51.71 kg (114 lb), last menstrual period 04/27/2014, SpO2 99.00%. Strength 5/5, would flat and dry  Disposition: home     Medication List    STOP taking these medications       ibuprofen 200 MG tablet  Commonly known as:  ADVIL,MOTRIN      TAKE these medications       acetaminophen 500 MG tablet  Commonly known as:  TYLENOL  Take 1,000 mg by mouth daily as needed (pain/headaches).     ACZONE 5 % topical gel  Generic drug:  Dapsone  Apply 1 application topically 2 (two) times daily. Apply to face     amphetamine-dextroamphetamine 20 MG tablet  Commonly known as:  ADDERALL  Take 20 mg by mouth 2 (two) times daily. To fill 06/12/2014     aspirin-acetaminophen-caffeine 250-250-65 MG per tablet  Commonly known as:  EXCEDRIN MIGRAINE  Take 1 tablet by mouth daily as needed for headache.     b complex vitamins tablet  Take 1 tablet by mouth daily.     B-complex with vitamin C tablet  Take 1 tablet by mouth daily.     CALCIUM 600+D 600-400 MG-UNIT per tablet  Generic drug:  Calcium Carbonate-Vitamin D  Take 1 tablet by mouth 2 (two) times daily.     doxycycline 40 MG capsule  Commonly known as:  ORACEA  Take 40 mg by mouth daily. Continuous course for acne     EPIDUO 0.1-2.5 % gel  Generic drug:  Adapalene-Benzoyl Peroxide  Apply 1 application topically at bedtime. Apply  to face     HYDROcodone-acetaminophen 5-325 MG per tablet  Commonly known as:  NORCO  Take 1 tablet by mouth every 6 (six) hours as needed for moderate pain.     methocarbamol 500 MG tablet  Commonly known as:  ROBAXIN  Take 1 tablet (500 mg total) by mouth every 6 (six) hours as needed for muscle spasms.     mometasone 50 MCG/ACT nasal spray  Commonly known as:  NASONEX  Place 2 sprays into the nose daily.     multivitamin with minerals Tabs tablet  Take 1 tablet by mouth daily. Centrum     PATADAY 0.2 % Soln  Generic drug:  Olopatadine HCl  Place 1 drop into both eyes daily as needed (itching/allergies).     polyethylene glycol packet  Commonly known as:  MIRALAX / GLYCOLAX  Take 17 g by mouth daily as needed for mild constipation.     rivaroxaban 10 MG Tabs tablet  Commonly known as:  XARELTO  Take 1 tablet (10 mg total) by mouth daily.           Follow-up Information   Follow up with [facility], MD.   Specialty:  Neurosurgery   Contact information:   [address] [place] [address] [phone]       Signed: Sakoya Win P 05/03/2014, 5:16 PM

## 2014-05-03 NOTE — Anesthesia Preprocedure Evaluation (Signed)
Anesthesia Evaluation  Patient identified by MRN, date of birth, ID band Patient awake    Reviewed: Allergy & Precautions, H&P , NPO status , Patient's Chart, lab work & pertinent test results  History of Anesthesia Complications (+) PONV  Airway Mallampati: II      Dental   Pulmonary  breath sounds clear to auscultation        Cardiovascular negative cardio ROS  Rhythm:Regular Rate:Normal     Neuro/Psych  Headaches,    GI/Hepatic negative GI ROS,   Endo/Other  negative endocrine ROS  Renal/GU negative Renal ROS     Musculoskeletal   Abdominal   Peds  Hematology   Anesthesia Other Findings   Reproductive/Obstetrics                           Anesthesia Physical Anesthesia Plan  ASA: II  Anesthesia Plan: General   Post-op Pain Management:    Induction: Intravenous  Airway Management Planned: Oral ETT  Additional Equipment:   Intra-op Plan:   Post-operative Plan: Extubation in OR  Informed Consent: I have reviewed the patients History and Physical, chart, labs and discussed the procedure including the risks, benefits and alternatives for the proposed anesthesia with the patient or authorized representative who has indicated his/her understanding and acceptance.   Dental advisory given  Plan Discussed with: CRNA and Anesthesiologist  Anesthesia Plan Comments:         Anesthesia Quick Evaluation

## 2014-05-03 NOTE — Plan of Care (Signed)
Problem: Consults Goal: Diagnosis - Spinal Surgery Outcome: Completed/Met Date Met:  05/03/14 Cervical Spine Fusion

## 2014-05-03 NOTE — Progress Notes (Signed)
Pt. discharged home accompanied by husband. Prescriptions and discharge instructions given with verbalization of understanding. Incision site on neck with no s/s of infection - no swelling, redness, bleeding, and/or drainage noted.  Anterior Cervical Fusion surgery notes instructions given to patient and family member for home safety and precautions. Pt. and family stated understanding of instructions given.

## 2014-05-03 NOTE — H&P (Signed)
Tiffany Parker is an 48 y.o. female.   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 She notices worsening pain with flexion extension of her neck and lateral bending and she localizes it to this bony mass she feels in the right side of her neck. 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 CT scan shows the spur and close proximity to the carotid vertebral and jugular vein. However due to this being the site of the predominance of her pain her progression of clinical syndrome failed conservative treatment she's requested we have recommended ACDF at C5-6 and C6-7 with attempted removal of a portion of the spur. I have extensively gone over the risks the need to this operation related to dissection of the spur around the carotid vertebral and jugular. She understands all these as well as risk of recurrent laryngeal nerve. We've also discussed the risks and benefits of a two-level ACDF at C5-6 and C6-7 with exploration of fusion removal of hardware at C4-5. I've discussed this case with ENT and vascular surgery and we all feel the dissection around this can be carried out safely.  Past Medical History  Diagnosis Date  . Allergy   . Blood transfusion     post delivery  . ADHD (attention deficit hyperactivity disorder)   . TMJ (dislocation of temporomandibular joint)   . PONV (postoperative nausea and vomiting)   . DVT (deep venous thrombosis) 04/2012    following spine surgery - right leg  . Arthritis   . Blood transfusion without reported diagnosis   . FAOZHYQM(578.4)     Past Surgical History  Procedure Laterality Date  . Cholecystectomy     . Cesarean section    . Episiotomy   11/19/97  . Episiotomy repair  11/1997  . Eye surgery    . Anterior cervical decomp/discectomy fusion  05/05/2012    Procedure: ANTERIOR CERVICAL DECOMPRESSION/DISCECTOMY FUSION 1 LEVEL;  Surgeon: Elaina Hoops, MD;  Location: [facility];  Service: Neurosurgery;  Laterality: N/A;  Cervical four-five anterior cervical decompression with fusion interbody prothesis plating and bonegraft    Family History  Problem Relation Age of Onset  . Cancer Mother   . Hypertension Father   . Hypothyroidism Father   . Diabetes Maternal Grandmother   . Stroke Maternal Grandmother   . Cancer Maternal Grandfather     lung  . Hypertension Paternal Grandmother   . Hyperlipidemia Paternal Grandmother   . Heart disease Paternal Grandmother   . Stroke Paternal Grandfather    Social History:  reports that she has never smoked. She does not have any smokeless tobacco history on file. She reports that she drinks alcohol. She reports that she does not use illicit drugs.  Allergies:  Allergies  Allergen Reactions  . Codeine Nausea And Vomiting  . Other Other (See Comments)    Anesthesia used in July 2013 caused nausea and vomiting  . Prenatal Vitamins Hives    Patient does not remember the brand of the prenatal vitamin that caused her to have issues.   Sarina Ill [Bactrim] Hives    Medications Prior to Admission  Medication  Sig Dispense Refill  . acetaminophen (TYLENOL) 500 MG tablet Take 1,000 mg by mouth daily as needed (pain/headaches).       . Adapalene-Benzoyl Peroxide (EPIDUO) 0.1-2.5 % gel Apply 1 application topically at bedtime. Apply to face      . amphetamine-dextroamphetamine (ADDERALL) 20 MG tablet Take 20 mg by mouth 2 (two) times daily. To fill 06/12/2014      . aspirin-acetaminophen-caffeine (EXCEDRIN MIGRAINE) 250-250-65 MG per tablet Take 1 tablet by mouth daily as needed for headache.       . b complex vitamins tablet Take 1 tablet by mouth daily.      .  B Complex-C (B-COMPLEX WITH VITAMIN C) tablet Take 1 tablet by mouth daily.      . Calcium Carbonate-Vitamin D (CALCIUM 600+D) 600-400 MG-UNIT per tablet Take 1 tablet by mouth 2 (two) times daily.      . Dapsone (ACZONE) 5 % topical gel Apply 1 application topically 2 (two) times daily. Apply to face      . doxycycline (ORACEA) 40 MG capsule Take 40 mg by mouth daily. Continuous course for acne      . ibuprofen (ADVIL,MOTRIN) 200 MG tablet Take 600 mg by mouth daily as needed for headache (pain).       . mometasone (NASONEX) 50 MCG/ACT nasal spray Place 2 sprays into the nose daily.      . Multiple Vitamin (MULTIVITAMIN WITH MINERALS) TABS Take 1 tablet by mouth daily. Centrum      . Olopatadine HCl (PATADAY) 0.2 % SOLN Place 1 drop into both eyes daily as needed (itching/allergies).      . polyethylene glycol (MIRALAX / GLYCOLAX) packet Take 17 g by mouth daily as needed for mild constipation.       . rivaroxaban (XARELTO) 10 MG TABS tablet Take 1 tablet (10 mg total) by mouth daily.  35 tablet  0    No results found for this or any previous visit (from the past 48 hour(s)). No results found.  Review of Systems  Constitutional: Negative.   Eyes: Negative.   Respiratory: Negative.   Cardiovascular: Negative.   Gastrointestinal: Negative.   Genitourinary: Negative.   Musculoskeletal: Positive for neck pain.  Skin: Negative.   Neurological: Positive for tingling and sensory change.  Endo/Heme/Allergies: Negative.   Psychiatric/Behavioral: Negative.     Blood pressure 130/92, pulse 85, temperature 97 F (36.1 C), temperature source Oral, weight 51.71 kg (114 lb), last menstrual period 04/27/2014, SpO2 100.00%. Physical Exam  Constitutional: She is oriented to person, place, and time. She appears well-developed and well-nourished.  HENT:  Head: Normocephalic.  Eyes: Pupils are equal, round, and reactive to light.  Neck: Normal range of motion.  Respiratory: Effort normal.  GI:  Soft. Bowel sounds are normal.  Neurological: She is alert and oriented to person, place, and time. She has normal strength. GCS eye subscore is 4. GCS verbal subscore is 5. GCS motor subscore is 6.  Reflex Scores:      Tricep reflexes are 2+ on the right side and 2+ on the left side.      Bicep reflexes are 2+ on the right side and 2+ on the left side.      Brachioradialis reflexes are 2+ on the right side and 2+ on the left side.      Patellar reflexes are 2+ on the right side and 2+ on the left side.      Achilles reflexes are 2+ on the right side and  2+ on the left side. Strength out of 5 in her deltoids, biceps, triceps, wrist flexion wrist extension and intrinsics.     Assessment/Plan 47 her fingers and spread ACDF C5-6 and C6-7 with an exploration of fusion and the hardware C4-5  Massai Hankerson P 05/03/2014, 7:20 AM

## 2014-05-03 NOTE — Discharge Instructions (Addendum)
° ° ° ° ° ° ° ° ° ° ° ° ° ° °  Wound Care  Keep the incision clean and dry remove the outer dressing in 2 days, leave the Steri-Strips intact. Wrap with Saran wrap for showers only Do not put any creams, lotions, or ointments on incision. Leave steri-strips on neck.  They will fall off by themselves.  Activity Walk each and every day, increasing distance each day. No lifting greater than 5 lbs.  Avoid excessive neck motion. No lifting no bending no twisting no driving or riding a car unless coming back and forth to see me. Wear neck brace at all times except when showering.   Diet Resume your normal diet.   Return to Work Will be discussed at you follow up appointment.  Call Your Doctor If Any of These Occur Redness, drainage, or swelling at the wound.  Temperature greater than 101 degrees. Severe pain not relieved by pain medication. Incision starts to come apart. Follow Up Appt Call today for appointment in 1-2 weeks ([phone]) or for problems.  If you have any hardware placed in your spine, you will need an x-ray before your appointment.     Information on my medicine - XARELTO (Rivaroxaban)  This medication education was reviewed with me or my healthcare representative as part of my discharge preparation.  Why was Xarelto prescribed for you? Xarelto was prescribed for you to reduce the risk of blood clots forming after orthopedic surgery. The medical term for these abnormal blood clots is venous thromboembolism (VTE).  What do you need to know about xarelto ? Take your Xarelto 10mg  ONCE DAILY at the same time every day. You may take it either with or without food.  If you have difficulty swallowing the tablet whole, you may crush it and mix in applesauce just prior to taking your dose.  Take Xarelto exactly as prescribed by your doctor and DO NOT stop taking Xarelto without talking to the doctor who prescribed the medication.  Stopping without other VTE prevention  medication to take the place of Xarelto may increase your risk of developing a clot.  After discharge, you should have regular check-up appointments with your healthcare provider that is prescribing your Xarelto.    What do you do if you miss a dose? If you miss a dose, take it as soon as you remember on the same day then continue your regularly scheduled once daily regimen the next day. Do not take two doses of Xarelto on the same day.   Important Safety Information A possible side effect of Xarelto is bleeding. You should call your healthcare provider right away if you experience any of the following:   Bleeding from an injury or your nose that does not stop.   Unusual colored urine (red or dark brown) or unusual colored stools (red or black).   Unusual bruising for unknown reasons.   A serious fall or if you hit your head (even if there is no bleeding).  Some medicines may interact with Xarelto and might increase your risk of bleeding while on Xarelto. To help avoid this, consult your healthcare provider or pharmacist prior to using any new prescription or non-prescription medications, including herbals, vitamins, non-steroidal anti-inflammatory drugs (NSAIDs) and supplements.  This website has more information on Xarelto: [email or web address]

## 2014-05-07 ENCOUNTER — Encounter (HOSPITAL_COMMUNITY): Payer: Self-pay | Admitting: Neurosurgery

## 2014-07-09 ENCOUNTER — Other Ambulatory Visit: Payer: Self-pay | Admitting: Family Medicine

## 2014-07-09 DIAGNOSIS — F988 Other specified behavioral and emotional disorders with onset usually occurring in childhood and adolescence: Secondary | ICD-10-CM

## 2014-07-10 MED ORDER — AMPHETAMINE-DEXTROAMPHETAMINE 20 MG PO TABS: 20.0000 mg | ORAL_TABLET | Freq: Two times a day (BID) | ORAL | Status: DC

## 2014-10-17 ENCOUNTER — Encounter: Payer: Self-pay | Admitting: Family Medicine

## 2014-10-17 DIAGNOSIS — F988 Other specified behavioral and emotional disorders with onset usually occurring in childhood and adolescence: Secondary | ICD-10-CM

## 2014-10-18 MED ORDER — AMPHETAMINE-DEXTROAMPHETAMINE 20 MG PO TABS: 20.0000 mg | ORAL_TABLET | Freq: Two times a day (BID) | ORAL | Status: DC

## 2014-11-11 ENCOUNTER — Encounter: Payer: Self-pay | Admitting: Family Medicine

## 2014-11-24 ENCOUNTER — Ambulatory Visit (INDEPENDENT_AMBULATORY_CARE_PROVIDER_SITE_OTHER): Payer: 59 | Admitting: Family Medicine

## 2014-11-24 VITALS — BP 108/68 | HR 90 | Temp 98.5°F | Resp 16 | Ht 61.0 in | Wt 113.0 lb

## 2014-11-24 DIAGNOSIS — F909 Attention-deficit hyperactivity disorder, unspecified type: Secondary | ICD-10-CM

## 2014-11-24 DIAGNOSIS — F988 Other specified behavioral and emotional disorders with onset usually occurring in childhood and adolescence: Secondary | ICD-10-CM

## 2014-11-24 MED ORDER — AMPHETAMINE-DEXTROAMPHETAMINE 20 MG PO TABS: 20.0000 mg | ORAL_TABLET | Freq: Two times a day (BID) | ORAL | Status: DC

## 2014-11-24 NOTE — Progress Notes (Signed)
[facility] [address], [place] [address] [phone]  Date:  11/24/2014   Name:  Tiffany Parker   DOB:  1966/10/04   MRN:  [id number]  PCP:  Lamar Blinks, MD    Chief Complaint: Follow-up and ADHD   History of Present Illness:  Tiffany Parker is a 49 y.o. very pleasant female patient who presents with the following:  Here today to follow0up on her ADHD.  She takes adderall 20 BID.  She finished her school program; she hopes that life will be a little easier for her now, she has a little more time to releax.   She is doing well with her current medication dosage.   She works 36 hours a week currently.  She will also pick back up her part time job She is done with her xarelto for post- op DVT LMP was approx 11/09/2014  Wt Readings from Last 3 Encounters:  11/24/14 113 lb (51.256 kg)  05/03/14 114 lb (51.71 kg)  04/09/14 113 lb 3.2 oz (51.347 kg)     Patient Active Problem List   Diagnosis Date Noted  . Spinal stenosis of cervical region 05/03/2014  . ADHD (attention deficit hyperactivity disorder) 12/22/2012  . Acne 12/22/2012    Past Medical History  Diagnosis Date  . Allergy   . Blood transfusion     post delivery  . ADHD (attention deficit hyperactivity disorder)   . TMJ (dislocation of temporomandibular joint)   . PONV (postoperative nausea and vomiting)   . DVT (deep venous thrombosis) 04/2012    following spine surgery - right leg  . Arthritis   . Blood transfusion without reported diagnosis   . ZYSAYTKZ(601.0)     Past Surgical History  Procedure Laterality Date  . Cholecystectomy    . Cesarean section    . Episiotomy   11/19/97  . Episiotomy repair  11/1997  . Eye surgery    . Anterior cervical decomp/discectomy fusion  05/05/2012    Procedure: ANTERIOR CERVICAL DECOMPRESSION/DISCECTOMY FUSION 1 LEVEL;  Surgeon: Elaina Hoops, MD;  Location: [facility];  Service: Neurosurgery;  Laterality: N/A;  Cervical four-five anterior  cervical decompression with fusion interbody prothesis plating and bonegraft  . Anterior cervical decomp/discectomy fusion N/A 05/03/2014    Procedure: CERVICAL FIVE TO SIX, CERVICAL SIX TO SEVEN ANTERIOR CERVICAL DECOMPRESSION/DISCECTOMY FUSION 2 LEVELS;  Surgeon: Elaina Hoops, MD;  Location: [facility];  Service: Neurosurgery;  Laterality: N/A;    History  Substance Use Topics  . Smoking status: Never Smoker   . Smokeless tobacco: Not on file  . Alcohol Use: Yes     Comment: socially    Family History  Problem Relation Age of Onset  . Cancer Mother   . Hypertension Father   . Hypothyroidism Father   . Diabetes Maternal Grandmother   . Stroke Maternal Grandmother   . Cancer Maternal Grandfather     lung  . Hypertension Paternal Grandmother   . Hyperlipidemia Paternal Grandmother   . Heart disease Paternal Grandmother   . Stroke Paternal Grandfather     Allergies  Allergen Reactions  . Codeine Nausea And Vomiting  . Other Other (See Comments)    Anesthesia used in July 2013 caused nausea and vomiting  . Prenatal Vitamins Hives    Patient does not remember the brand of the prenatal vitamin that caused her to have issues.   Sarina Ill [Bactrim] Hives    Medication list has been  reviewed and updated.  Current Outpatient Prescriptions on File Prior to Visit  Medication Sig Dispense Refill  . acetaminophen (TYLENOL) 500 MG tablet Take 1,000 mg by mouth daily as needed (pain/headaches).     . Adapalene-Benzoyl Peroxide (EPIDUO) 0.1-2.5 % gel Apply 1 application topically at bedtime. Apply to face    . amphetamine-dextroamphetamine (ADDERALL) 20 MG tablet Take 1 tablet (20 mg total) by mouth 2 (two) times daily. 60 tablet 0  . amphetamine-dextroamphetamine (ADDERALL) 20 MG tablet Take 1 tablet (20 mg total) by mouth 2 (two) times daily. Fill 30 days after rx 60 tablet 0  . amphetamine-dextroamphetamine (ADDERALL) 20 MG tablet Take 1 tablet (20 mg total) by mouth 2 (two) times  daily. Fil 60 days after rx 60 tablet 0  . amphetamine-dextroamphetamine (ADDERALL) 20 MG tablet Take 1 tablet (20 mg total) by mouth 2 (two) times daily. 60 tablet 0  . aspirin-acetaminophen-caffeine (EXCEDRIN MIGRAINE) 580-998-33 MG per tablet Take 1 tablet by mouth daily as needed for headache.     . b complex vitamins tablet Take 1 tablet by mouth daily.    . B Complex-C (B-COMPLEX WITH VITAMIN C) tablet Take 1 tablet by mouth daily.    . Calcium Carbonate-Vitamin D (CALCIUM 600+D) 600-400 MG-UNIT per tablet Take 1 tablet by mouth 2 (two) times daily.    . Dapsone (ACZONE) 5 % topical gel Apply 1 application topically 2 (two) times daily. Apply to face    . doxycycline (ORACEA) 40 MG capsule Take 40 mg by mouth daily. Continuous course for acne    . HYDROcodone-acetaminophen (NORCO) 5-325 MG per tablet Take 1 tablet by mouth every 6 (six) hours as needed for moderate pain. 60 tablet 0  . methocarbamol (ROBAXIN) 500 MG tablet Take 1 tablet (500 mg total) by mouth every 6 (six) hours as needed for muscle spasms. 60 tablet 1  . mometasone (NASONEX) 50 MCG/ACT nasal spray Place 2 sprays into the nose daily.    . Multiple Vitamin (MULTIVITAMIN WITH MINERALS) TABS Take 1 tablet by mouth daily. Centrum    . Olopatadine HCl (PATADAY) 0.2 % SOLN Place 1 drop into both eyes daily as needed (itching/allergies).    . polyethylene glycol (MIRALAX / GLYCOLAX) packet Take 17 g by mouth daily as needed for mild constipation.     . rivaroxaban (XARELTO) 10 MG TABS tablet Take 1 tablet (10 mg total) by mouth daily. 35 tablet 0   No current facility-administered medications on file prior to visit.    Review of Systems:  As per HPI- otherwise negative.   Physical Examination: Filed Vitals:   11/24/14 1043  BP: 108/68  Pulse: 120  Temp: 98.5 F (36.9 C)  Resp: 16   Filed Vitals:   11/24/14 1043  Height: 5\' 1"  (1.549 m)  Weight: 113 lb (51.256 kg)   Body mass index is 21.36 kg/(m^2). Ideal Body  Weight: Weight in (lb) to have BMI = 25: 132  GEN: WDWN, NAD, Non-toxic, A & O x 3, slight build, looks well HEENT: Atraumatic, Normocephalic. Neck supple. No masses, No LAD. Ears and Nose: No external deformity. CV: RRR, No M/G/R. No JVD. No thrill. No extra heart sounds. PULM: CTA B, no wheezes, crackles, rhonchi. No retractions. No resp. distress. No accessory muscle use. EXTR: No c/c/e NEURO Normal gait.  PSYCH: Normally interactive. Conversant. Not depressed or anxious appearing.  Calm demeanor.    Assessment and Plan: ADD (attention deficit disorder) - Plan: amphetamine-dextroamphetamine (ADDERALL) 20 MG tablet, amphetamine-dextroamphetamine (  ADDERALL) 20 MG tablet, amphetamine-dextroamphetamine (ADDERALL) 20 MG tablet  Refilled her ADHD medications today.  She will contact me in 3 months.  Doing well with her current regiemn   Signed Lamar Blinks, MD

## 2014-11-24 NOTE — Patient Instructions (Addendum)
[facility] Policy for Prescribing Controlled Substances (Revised 08/2012) 1. Prescriptions for controlled substances will be filled by ONE provider at [facility] with whom you have established and developed a plan for your care, including follow-up. 2. You are encouraged to schedule an appointment with your prescriber at our appointment center for follow-up visits whenever possible. 3. If you request a prescription for the controlled substance while at [facility] for an acute problem (with someone other than your regular prescriber), you MAY be given a ONE-TIME prescription for a 30-day supply of the controlled substance, to allow time for you to return to see your regular prescriber for additional prescriptions.  Great to see you today- take care! Please contact me in 3 months when you need more adderall

## 2015-04-11 ENCOUNTER — Ambulatory Visit (INDEPENDENT_AMBULATORY_CARE_PROVIDER_SITE_OTHER): Payer: 59 | Admitting: Family Medicine

## 2015-04-11 VITALS — BP 118/70 | HR 89 | Temp 98.4°F | Resp 17 | Ht 61.0 in | Wt 117.0 lb

## 2015-04-11 DIAGNOSIS — Z1329 Encounter for screening for other suspected endocrine disorder: Secondary | ICD-10-CM | POA: Diagnosis not present

## 2015-04-11 DIAGNOSIS — F909 Attention-deficit hyperactivity disorder, unspecified type: Secondary | ICD-10-CM

## 2015-04-11 DIAGNOSIS — F988 Other specified behavioral and emotional disorders with onset usually occurring in childhood and adolescence: Secondary | ICD-10-CM

## 2015-04-11 DIAGNOSIS — Z130 Encounter for screening for diseases of the blood and blood-forming organs and certain disorders involving the immune mechanism: Secondary | ICD-10-CM | POA: Diagnosis not present

## 2015-04-11 DIAGNOSIS — Z0000 Encounter for general adult medical examination without abnormal findings: Secondary | ICD-10-CM

## 2015-04-11 DIAGNOSIS — Z131 Encounter for screening for diabetes mellitus: Secondary | ICD-10-CM

## 2015-04-11 LAB — CBC
HCT: 42.8 % (ref 36.0–46.0)
Hemoglobin: 14.5 g/dL (ref 12.0–15.0)
MCH: 29.8 pg (ref 26.0–34.0)
MCHC: 33.9 g/dL (ref 30.0–36.0)
MCV: 87.9 fL (ref 78.0–100.0)
MPV: 10.2 fL (ref 8.6–12.4)
PLATELETS: 248 10*3/uL (ref 150–400)
RBC: 4.87 MIL/uL (ref 3.87–5.11)
RDW: 12.8 % (ref 11.5–15.5)
WBC: 7.9 10*3/uL (ref 4.0–10.5)

## 2015-04-11 MED ORDER — AMPHETAMINE-DEXTROAMPHETAMINE 20 MG PO TABS: ORAL_TABLET | ORAL | Status: DC

## 2015-04-11 MED ORDER — AMPHETAMINE-DEXTROAMPHETAMINE 20 MG PO TABS
ORAL_TABLET | ORAL | Status: DC
Start: 2015-04-11 — End: 2015-07-06

## 2015-04-11 NOTE — Patient Instructions (Signed)
Great to see you today- I will be in touch with your labs.   Continue adderall as needed- I wrote for 10 mg 2 or 3x daily as needed

## 2015-04-11 NOTE — Progress Notes (Signed)
[facility] [address] [place] [address] [phone]  Date:  04/11/2015   Name:  Tiffany Parker   DOB:  [birth year]   MRN:  [id number]  PCP:  Lamar Blinks, MD    Chief Complaint: Annual Exam   History of Present Illness:  Tiffany Parker is a 49 y.o. very pleasant female patient who presents with the following:  Here today for a CPE.  Historyof ADHD, she is a Marine scientist.  She was treated for a post- op DVT back in 2013- she was on blood thinners but this is now complete  Excellent cholesterol last year, she is not fasting.  She is seeing her OBG next month for her pap She did her mammo last week- it was normal.   She is taking adderall but less than she used to as she is no longer in school, she is just working.  She is taking 20- 30 mg of adderall daily   She is still exercising but not as much as she had in the past- notes that she has gained a few lbs recently  Patient Active Problem List   Diagnosis Date Noted  . Spinal stenosis of cervical region 05/03/2014  . ADHD (attention deficit hyperactivity disorder) 12/22/2012  . Acne 12/22/2012    Past Medical History  Diagnosis Date  . Allergy   . Blood transfusion     post delivery  . ADHD (attention deficit hyperactivity disorder)   . TMJ (dislocation of temporomandibular joint)   . PONV (postoperative nausea and vomiting)   . DVT (deep venous thrombosis) 04/2012    following spine surgery - right leg  . Arthritis   . Blood transfusion without reported diagnosis   . [id number](629.4)     Past Surgical History  Procedure Laterality Date  . Cholecystectomy    . Cesarean section    . Episiotomy   11/19/97  . Episiotomy repair  11/1997  . Eye surgery    . Anterior cervical decomp/discectomy fusion  05/05/2012    Procedure: ANTERIOR CERVICAL DECOMPRESSION/DISCECTOMY FUSION 1 LEVEL;  Surgeon: Elaina Hoops, MD;  Location: [facility];  Service: Neurosurgery;  Laterality: N/A;  Cervical four-five  anterior cervical decompression with fusion interbody prothesis plating and bonegraft  . Anterior cervical decomp/discectomy fusion N/A 05/03/2014    Procedure: CERVICAL FIVE TO SIX, CERVICAL SIX TO SEVEN ANTERIOR CERVICAL DECOMPRESSION/DISCECTOMY FUSION 2 LEVELS;  Surgeon: Elaina Hoops, MD;  Location: [facility];  Service: Neurosurgery;  Laterality: N/A;    History  Substance Use Topics  . Smoking status: Never Smoker   . Smokeless tobacco: Not on file  . Alcohol Use: Yes     Comment: socially    Family History  Problem Relation Age of Onset  . Cancer Mother   . Hypertension Father   . Hypothyroidism Father   . Diabetes Maternal Grandmother   . Stroke Maternal Grandmother   . Cancer Maternal Grandfather     lung  . Hypertension Paternal Grandmother   . Hyperlipidemia Paternal Grandmother   . Heart disease Paternal Grandmother   . Stroke Paternal Grandfather     Allergies  Allergen Reactions  . Codeine Nausea And Vomiting  . Other Other (See Comments)    Anesthesia used in July 2013 caused nausea and vomiting  . Prenatal Vitamins Hives    Patient does not remember the brand of the prenatal vitamin that caused her to have issues.   Sarina Ill [Bactrim] Hives  Medication list has been reviewed and updated.  Current Outpatient Prescriptions on File Prior to Visit  Medication Sig Dispense Refill  . acetaminophen (TYLENOL) 500 MG tablet Take 1,000 mg by mouth daily as needed (pain/headaches).     . Adapalene-Benzoyl Peroxide (EPIDUO) 0.1-2.5 % gel Apply 1 application topically at bedtime. Apply to face    . amphetamine-dextroamphetamine (ADDERALL) 20 MG tablet Take 1 tablet (20 mg total) by mouth 2 (two) times daily. Fil 60 days after rx 60 tablet 0  . amphetamine-dextroamphetamine (ADDERALL) 20 MG tablet Take 1 tablet (20 mg total) by mouth 2 (two) times daily. Fill 30 days after rx 60 tablet 0  . aspirin-acetaminophen-caffeine (EXCEDRIN MIGRAINE) 093-235-57 MG per tablet  Take 1 tablet by mouth daily as needed for headache.     . b complex vitamins tablet Take 1 tablet by mouth daily.    . B Complex-C (B-COMPLEX WITH VITAMIN C) tablet Take 1 tablet by mouth daily.    . Calcium Carbonate-Vitamin D (CALCIUM 600+D) 600-400 MG-UNIT per tablet Take 1 tablet by mouth 2 (two) times daily.    . Dapsone (ACZONE) 5 % topical gel Apply 1 application topically 2 (two) times daily. Apply to face    . doxycycline (ORACEA) 40 MG capsule Take 40 mg by mouth daily. Continuous course for acne    . HYDROcodone-acetaminophen (NORCO) 5-325 MG per tablet Take 1 tablet by mouth every 6 (six) hours as needed for moderate pain. 60 tablet 0  . methocarbamol (ROBAXIN) 500 MG tablet Take 1 tablet (500 mg total) by mouth every 6 (six) hours as needed for muscle spasms. 60 tablet 1  . mometasone (NASONEX) 50 MCG/ACT nasal spray Place 2 sprays into the nose daily.    . Multiple Vitamin (MULTIVITAMIN WITH MINERALS) TABS Take 1 tablet by mouth daily. Centrum    . Olopatadine HCl (PATADAY) 0.2 % SOLN Place 1 drop into both eyes daily as needed (itching/allergies).     No current facility-administered medications on file prior to visit.    Review of Systems:  As per HPI- otherwise negative.  Wt Readings from Last 3 Encounters:  04/11/15 117 lb (53.071 kg)  11/24/14 113 lb (51.256 kg)  05/03/14 114 lb (51.71 kg)    Physical Examination: Filed Vitals:   04/11/15 1408  BP: 118/70  Pulse: 89  Temp: 98.4 F (36.9 C)  Resp: 17   Filed Vitals:   04/11/15 1408  Height: 5\' 1"  (1.549 m)  Weight: 117 lb (53.071 kg)   Body mass index is 22.12 kg/(m^2). Ideal Body Weight: Weight in (lb) to have BMI = 25: 132  GEN: WDWN, NAD, Non-toxic, A & O x 3, looks well today HEENT: Atraumatic, Normocephalic. Neck supple. No masses, No LAD. Ears and Nose: No external deformity. CV: RRR, No M/G/R. No JVD. No thrill. No extra heart sounds. PULM: CTA B, no wheezes, crackles, rhonchi. No retractions.  No resp. distress. No accessory muscle use. ABD: S, NT, ND. No rebound. No HSM. EXTR: No c/c/e NEURO Normal gait.  PSYCH: Normally interactive. Conversant. Not depressed or anxious appearing.  Calm demeanor.     Assessment and Plan: Physical exam  ADD (attention deficit disorder) - Plan: amphetamine-dextroamphetamine (ADDERALL) 20 MG tablet, amphetamine-dextroamphetamine (ADDERALL) 20 MG tablet, amphetamine-dextroamphetamine (ADDERALL) 20 MG tablet  Screening for deficiency anemia - Plan: CBC  Screening for diabetes mellitus - Plan: Comprehensive metabolic panel  Screening for hypothyroidism - Plan: TSH  CPE today- she is doing well and has no concerns.  She has cut back on her adderall some.  Decreased her rx to 50 pills per month Await other labs She will see her OBG next month   Signed Lamar Blinks, MD

## 2015-04-12 LAB — COMPREHENSIVE METABOLIC PANEL
ALT: 11 U/L (ref 0–35)
AST: 14 U/L (ref 0–37)
Albumin: 4.6 g/dL (ref 3.5–5.2)
Alkaline Phosphatase: 75 U/L (ref 39–117)
BILIRUBIN TOTAL: 0.3 mg/dL (ref 0.2–1.2)
BUN: 19 mg/dL (ref 6–23)
CALCIUM: 9.7 mg/dL (ref 8.4–10.5)
CHLORIDE: 102 meq/L (ref 96–112)
CO2: 26 mEq/L (ref 19–32)
CREATININE: 0.57 mg/dL (ref 0.50–1.10)
Glucose, Bld: 89 mg/dL (ref 70–99)
Potassium: 4.7 mEq/L (ref 3.5–5.3)
Sodium: 138 mEq/L (ref 135–145)
Total Protein: 7.3 g/dL (ref 6.0–8.3)

## 2015-04-12 LAB — TSH: TSH: 4.312 u[IU]/mL (ref 0.350–4.500)

## 2015-05-20 ENCOUNTER — Other Ambulatory Visit: Payer: Self-pay | Admitting: Family Medicine

## 2015-05-20 DIAGNOSIS — L700 Acne vulgaris: Secondary | ICD-10-CM

## 2015-05-20 DIAGNOSIS — J302 Other seasonal allergic rhinitis: Secondary | ICD-10-CM

## 2015-05-20 NOTE — Telephone Encounter (Signed)
Pt just had CPE, but don't see these meds discussed. They are on her current med list at [facility]. OK to RF?

## 2015-06-23 ENCOUNTER — Encounter: Payer: Self-pay | Admitting: Family Medicine

## 2015-06-23 DIAGNOSIS — M8580 Other specified disorders of bone density and structure, unspecified site: Secondary | ICD-10-CM

## 2015-06-29 NOTE — Addendum Note (Signed)
Addended by: Lamar Blinks C on: 06/29/2015 05:23 PM   Modules accepted: Orders

## 2015-07-04 ENCOUNTER — Other Ambulatory Visit: Payer: Self-pay

## 2015-07-04 DIAGNOSIS — E2839 Other primary ovarian failure: Secondary | ICD-10-CM

## 2015-07-06 ENCOUNTER — Other Ambulatory Visit: Payer: Self-pay | Admitting: Family Medicine

## 2015-07-06 DIAGNOSIS — F988 Other specified behavioral and emotional disorders with onset usually occurring in childhood and adolescence: Secondary | ICD-10-CM

## 2015-07-06 MED ORDER — AMPHETAMINE-DEXTROAMPHETAMINE 20 MG PO TABS: ORAL_TABLET | ORAL | Status: DC

## 2015-08-08 ENCOUNTER — Other Ambulatory Visit: Payer: 59

## 2015-09-12 ENCOUNTER — Ambulatory Visit
Admission: RE | Admit: 2015-09-12 | Discharge: 2015-09-12 | Disposition: A | Discharge status: Home / Self Care | Payer: 59 | Source: Ambulatory Visit | Attending: Family Medicine | Admitting: Family Medicine

## 2015-09-12 DIAGNOSIS — E2839 Other primary ovarian failure: Secondary | ICD-10-CM

## 2015-09-13 ENCOUNTER — Other Ambulatory Visit: Payer: Self-pay | Admitting: Family Medicine

## 2015-09-13 DIAGNOSIS — M8580 Other specified disorders of bone density and structure, unspecified site: Secondary | ICD-10-CM

## 2015-09-16 ENCOUNTER — Other Ambulatory Visit (INDEPENDENT_AMBULATORY_CARE_PROVIDER_SITE_OTHER): Payer: 59

## 2015-09-16 DIAGNOSIS — M8580 Other specified disorders of bone density and structure, unspecified site: Secondary | ICD-10-CM | POA: Diagnosis not present

## 2015-09-17 ENCOUNTER — Encounter: Payer: Self-pay | Admitting: Family Medicine

## 2015-09-17 DIAGNOSIS — M8580 Other specified disorders of bone density and structure, unspecified site: Secondary | ICD-10-CM

## 2015-09-17 LAB — VITAMIN D 25 HYDROXY (VIT D DEFICIENCY, FRACTURES): Vit D, 25-Hydroxy: 29 ng/mL — ABNORMAL LOW (ref 30–100)

## 2015-09-19 LAB — PTH, INTACT AND CALCIUM
Calcium: 9.5 mg/dL (ref 8.4–10.5)
PTH: 38 pg/mL (ref 14–64)

## 2015-09-30 ENCOUNTER — Ambulatory Visit (INDEPENDENT_AMBULATORY_CARE_PROVIDER_SITE_OTHER): Payer: 59 | Admitting: Endocrinology

## 2015-09-30 ENCOUNTER — Encounter: Payer: Self-pay | Admitting: Endocrinology

## 2015-09-30 VITALS — BP 126/72 | HR 90 | Temp 98.0°F | Resp 14 | Ht 60.05 in | Wt 121.0 lb

## 2015-09-30 DIAGNOSIS — R5383 Other fatigue: Secondary | ICD-10-CM | POA: Diagnosis not present

## 2015-09-30 DIAGNOSIS — M8580 Other specified disorders of bone density and structure, unspecified site: Secondary | ICD-10-CM | POA: Diagnosis not present

## 2015-09-30 LAB — T4, FREE: FREE T4: 1.01 ng/dL (ref 0.60–1.60)

## 2015-09-30 LAB — TSH: TSH: 3.73 u[IU]/mL (ref 0.35–4.50)

## 2015-09-30 NOTE — Patient Instructions (Signed)
Vitamin D, 1000 units daily

## 2015-09-30 NOTE — Progress Notes (Signed)
Patient ID: Tiffany Parker, female   DOB: 10/10/1966, 49 y.o.   MRN: [id number]           Chief complaint: Evaluation of low bone mass  Referring physician: Dr. Lorelei Pont  History of Present Illness:  The patient is referred here for low bone mass. She had a screening bone density done because of having a toe fracture and her podiatrist felt that her bones looked osteopenic on regular x-ray. Bone density showed the following Z-scores:  Femoral neck: -2.4 Spine: Normal FRAX fracture risk cannot be calculated for premenopausal women  She has no history of low trauma fracture or height loss.  She had a toe fracture when a heavy television set fell on it Menopausal status: She is still premenstrual Currently has no active problems or bone pain  Previous treatment: None Calcium supplements: Irregular, usually one a day Vitamin D supplements: None except in calcium tablet  Lab evaluation: Vitamin D level 29 Chemistry, CBC and thyroid normal PTH level normal   Past Medical History  Diagnosis Date  . Allergy   . Blood transfusion     post delivery  . ADHD (attention deficit hyperactivity disorder)   . TMJ (dislocation of temporomandibular joint)   . PONV (postoperative nausea and vomiting)   . DVT (deep venous thrombosis) ([place]) 04/2012    following spine surgery - right leg  . Arthritis   . Blood transfusion without reported diagnosis   . [id number])     Past Surgical History  Procedure Laterality Date  . Cholecystectomy    . Cesarean section    . Episiotomy   11/19/97  . Episiotomy repair  11/1997  . Eye surgery    . Anterior cervical decomp/discectomy fusion  05/05/2012    Procedure: ANTERIOR CERVICAL DECOMPRESSION/DISCECTOMY FUSION 1 LEVEL;  Surgeon: Elaina Hoops, MD;  Location: [facility];  Service: Neurosurgery;  Laterality: N/A;  Cervical four-five anterior cervical decompression with fusion interbody prothesis plating and bonegraft  . Anterior cervical  decomp/discectomy fusion N/A 05/03/2014    Procedure: CERVICAL FIVE TO SIX, CERVICAL SIX TO SEVEN ANTERIOR CERVICAL DECOMPRESSION/DISCECTOMY FUSION 2 LEVELS;  Surgeon: Elaina Hoops, MD;  Location: [facility];  Service: Neurosurgery;  Laterality: N/A;    Family History  Problem Relation Age of Onset  . Cancer Mother   . Hypertension Father   . Hypothyroidism Father   . Diabetes Maternal Grandmother   . Stroke Maternal Grandmother   . Cancer Maternal Grandfather     lung  . Hypertension Paternal Grandmother   . Hyperlipidemia Paternal Grandmother   . Heart disease Paternal Grandmother   . Stroke Paternal Grandfather   . Osteoporosis Neg Hx     Social History:  reports that she has never smoked. She does not have any smokeless tobacco history on file. She reports that she drinks alcohol. She reports that she does not use illicit drugs.  Allergies:  Allergies  Allergen Reactions  . Codeine Nausea And Vomiting  . Other Other (See Comments)    Anesthesia used in July 2013 caused nausea and vomiting  . Prenatal Vitamins Hives    Patient does not remember the brand of the prenatal vitamin that caused her to have issues.   Sarina Ill [Bactrim] Hives      Medication List       This list is accurate as of: 09/30/15 11:59 PM.  Always use your Parker recent med list.  acetaminophen 500 MG tablet  Commonly known as:  TYLENOL  Take 1,000 mg by mouth daily as needed (pain/headaches).     ACZONE 5 % topical gel  Generic drug:  Dapsone  Apply 1 application topically 2 (two) times daily. Apply to face     amphetamine-dextroamphetamine 20 MG tablet  Commonly known as:  ADDERALL  Take 10 mg 2 or 3x daily as needed.  Fill 30 days after rx     amphetamine-dextroamphetamine 20 MG tablet  Commonly known as:  ADDERALL  Take 10 mg 2 or 3x daily as needed     aspirin-acetaminophen-caffeine 250-250-65 MG tablet  Commonly known as:  EXCEDRIN MIGRAINE  Take 1 tablet by mouth  daily as needed for headache.     B-complex with vitamin C tablet  Take 1 tablet by mouth daily.     Biotin 5 MG Tabs  Take by mouth.     CALCIUM 600+D 600-400 MG-UNIT tablet  Generic drug:  Calcium Carbonate-Vitamin D  Take 1 tablet by mouth 2 (two) times daily.     doxycycline 40 MG capsule  Commonly known as:  ORACEA  TAKE 1 CAPSULE BY MOUTH EVERY MORNING     EPIDUO 0.1-2.5 % gel  Generic drug:  Adapalene-Benzoyl Peroxide  Apply 1 application topically at bedtime. Apply to face     Fish Oil 1200 MG Caps  Take by mouth.     HYDROcodone-acetaminophen 5-325 MG tablet  Commonly known as:  NORCO  Take 1 tablet by mouth every 6 (six) hours as needed for moderate pain.     magnesium oxide 400 MG tablet  Commonly known as:  MAG-OX  Take 400 mg by mouth daily.     methocarbamol 500 MG tablet  Commonly known as:  ROBAXIN  Take 1 tablet (500 mg total) by mouth every 6 (six) hours as needed for muscle spasms.     mometasone 50 MCG/ACT nasal spray  Commonly known as:  NASONEX  Place 2 sprays into the nose daily.     multivitamin with minerals Tabs tablet  Take 1 tablet by mouth daily. Centrum     PATADAY 0.2 % Soln  Generic drug:  Olopatadine HCl  Place 1 drop into both eyes daily as needed (itching/allergies).         Review of Systems  Constitutional: Positive for malaise.       She has gradually gained about 10lb More recently has been feeling tired  HENT: Negative for headaches.   Cardiovascular: Negative for leg swelling.  Gastrointestinal: Negative for constipation.  Endocrine: Positive for abnormal weight gain and decreased concentration. Negative for cold intolerance and heat intolerance.       She has attention deficit disorder and is treated with Adderall, better with this treatment Menstrual changes: She has relatively less bleeding initially and then the menses start again for a couple of days  Musculoskeletal: Negative for back pain.  Skin: Negative for  rash.  Neurological: Negative for weakness.  Psychiatric/Behavioral: Negative for depressed mood and insomnia.     LABS:   Office Visit on 09/30/2015  Component Date Value Ref Range Status  . TSH 09/30/2015 3.73  0.35 - 4.50 uIU/mL Final  . Free T4 09/30/2015 1.01  0.60 - 1.60 ng/dL Final     PHYSICAL EXAM:  BP 126/72 mmHg  Pulse 90  Temp(Src) 98 F (36.7 C)  Resp 14  Ht 5' 0.05" (1.525 m)  Wt 121 lb (54.885 kg)  BMI 23.60 kg/m2  SpO2 97%  LMP 09/10/2015  GENERAL: Small frame, thinly built, pleasant and well looking  No pallor, clubbing, or edema.   Skin:  no rash or pigmentation.  EYES:  Externally normal.    ENT: Oral mucosa and tongue normal.  THYROID:  Not palpable.  No cervical lymphadenopathy  HEART:  Normal  S1 and S2; no murmur or click.  CHEST:  Normal shape.  Lungs: Vescicular breath sounds heard equally.  No crepitations/ wheeze.  ABDOMEN:  No distention.  Liver and spleen not palpable.  No other mass or tenderness.  JOINTS:  Normal.  SPINE: Normal appearance, no abnormal prominence of spines  NEUROLOGICAL: .Reflexes are normal bilaterally normal at biceps.   ASSESSMENT:   LOW BONE MASS:  She has an asymptomatic slightly low bone mass with Z score of -2.4 Current evaluation indicates no secondary causes of low bone mass and she is premenopausal. Unable to assess fracture risk as she is premenopausal Her toe fracture was likely traumatic and not indicating her fracture propensity  Discussed with the patient that currently there are no studies indicating that patients are at higher fracture risk because of slightly low premenopausal bone mass  Vitamin D deficiency: She has a level of 29 and is not taking vitamin D separately Because of her low bone mass needs to have her vitamin D level at least 40-50  FATIGUE: This is relatively new.  Since she has a family history of thyroid disease will recheck her TSH today   PLAN:    Start at least  1000 units of vitamin D3  Take 600 mg of calcium daily and increase dietary calcium from dairy products and foods rich in calcium  Follow-up bone density in 2 years, sooner if she has any height loss, unexplained fractures or early menopause  Check thyroid level for fatigue    Patient Instructions  Vitamin D, 1000 units daily       Tiffany Parker 10/03/2015, 9:14 AM    Addendum: Labs normal  Lab Results  Component Value Date   TSH 3.73 09/30/2015

## 2015-10-13 DIAGNOSIS — M8580 Other specified disorders of bone density and structure, unspecified site: Secondary | ICD-10-CM | POA: Insufficient documentation

## 2015-11-18 ENCOUNTER — Encounter: Payer: Self-pay | Admitting: Family Medicine

## 2015-11-23 ENCOUNTER — Encounter: Payer: Self-pay | Admitting: Family Medicine

## 2015-11-24 ENCOUNTER — Encounter: Payer: Self-pay | Admitting: Family Medicine

## 2015-11-24 DIAGNOSIS — M62838 Other muscle spasm: Secondary | ICD-10-CM

## 2015-11-24 DIAGNOSIS — F988 Other specified behavioral and emotional disorders with onset usually occurring in childhood and adolescence: Secondary | ICD-10-CM

## 2015-11-24 DIAGNOSIS — L700 Acne vulgaris: Secondary | ICD-10-CM

## 2015-11-25 MED ORDER — AMPHETAMINE-DEXTROAMPHETAMINE 20 MG PO TABS: ORAL_TABLET | ORAL | Status: DC

## 2015-11-25 MED ORDER — DOXYCYCLINE 40 MG PO CPDR: 40.0000 mg | DELAYED_RELEASE_CAPSULE | Freq: Every morning | ORAL | Status: DC

## 2015-11-25 MED ORDER — METHOCARBAMOL 500 MG PO TABS: 500.0000 mg | ORAL_TABLET | Freq: Four times a day (QID) | ORAL | Status: DC | PRN

## 2015-11-25 MED FILL — DOXYCYCLINE IR-DR 40 MG CAP: 40 | 90 days supply | Qty: 90 | Fill #2

## 2015-11-25 MED FILL — METHOCARBAMOL 500 MG TABLET: 500 | 15 days supply | Qty: 60 | Fill #0

## 2015-11-30 MED FILL — AMPHETAMINE SALTS 20 MG TAB: 20 | 33 days supply | Qty: 50 | Fill #0

## 2015-12-22 ENCOUNTER — Encounter: Payer: Self-pay | Admitting: Family Medicine

## 2015-12-22 ENCOUNTER — Ambulatory Visit (INDEPENDENT_AMBULATORY_CARE_PROVIDER_SITE_OTHER): Payer: 59 | Admitting: Family Medicine

## 2015-12-22 VITALS — BP 120/78 | HR 97 | Temp 98.0°F | Ht 60.5 in | Wt 122.8 lb

## 2015-12-22 DIAGNOSIS — F988 Other specified behavioral and emotional disorders with onset usually occurring in childhood and adolescence: Secondary | ICD-10-CM

## 2015-12-22 DIAGNOSIS — F909 Attention-deficit hyperactivity disorder, unspecified type: Secondary | ICD-10-CM

## 2015-12-22 MED ORDER — AMPHETAMINE-DEXTROAMPHETAMINE 20 MG PO TABS: ORAL_TABLET | ORAL | Status: DC

## 2015-12-22 NOTE — Progress Notes (Signed)
Pre visit review using our clinic review tool, if applicable. No additional management support is needed unless otherwise documented below in the visit note. 

## 2015-12-22 NOTE — Progress Notes (Signed)
[place] at [facility] [address], [place], [place] [address] [phone]  Date:  12/22/2015   Name:  Tiffany Parker   DOB:  03-Oct-1966   MRN:  [id number]  PCP:  Lamar Blinks, MD    Chief Complaint: Follow-up   History of Present Illness:  Tiffany Parker is a 50 y.o. very pleasant female patient who presents with the following:  Pt known to me from my previous office,  Here today for a recheck  Flu shot UTD  She sees OBG for her well woman care.   Her children and son have had sinus infections recently.    She needs he ADHD medications refilled.  She works tuesdays and weekend days.   Her mother is ill and having trouble with dementia. She is remarried and her step- father   Her dose of medication for ADHD continues to work well for - she takes 10 mg of adderall 2-3x daily- generally takes jut 20 mg total a day  Patient Active Problem List   Diagnosis Date Noted  . Spinal stenosis of cervical region 05/03/2014  . ADHD (attention deficit hyperactivity disorder) 12/22/2012  . Acne 12/22/2012    Past Medical History  Diagnosis Date  . Allergy   . Blood transfusion     post delivery  . ADHD (attention deficit hyperactivity disorder)   . TMJ (dislocation of temporomandibular joint)   . PONV (postoperative nausea and vomiting)   . DVT (deep venous thrombosis) ([facility]) 04/2012    following spine surgery - right leg  . Arthritis   . Blood transfusion without reported diagnosis   . [id number])     Past Surgical History  Procedure Laterality Date  . Cholecystectomy    . Cesarean section    . Episiotomy   11/19/97  . Episiotomy repair  11/1997  . Eye surgery    . Anterior cervical decomp/discectomy fusion  05/05/2012    Procedure: ANTERIOR CERVICAL DECOMPRESSION/DISCECTOMY FUSION 1 LEVEL;  Surgeon: Elaina Hoops, MD;  Location: [facility];  Service: Neurosurgery;  Laterality: N/A;  Cervical four-five anterior  cervical decompression with fusion interbody prothesis plating and bonegraft  . Anterior cervical decomp/discectomy fusion N/A 05/03/2014    Procedure: CERVICAL FIVE TO SIX, CERVICAL SIX TO SEVEN ANTERIOR CERVICAL DECOMPRESSION/DISCECTOMY FUSION 2 LEVELS;  Surgeon: Elaina Hoops, MD;  Location: [facility];  Service: Neurosurgery;  Laterality: N/A;    Social History  Substance Use Topics  . Smoking status: Never Smoker   . Smokeless tobacco: None  . Alcohol Use: Yes     Comment: socially    Family History  Problem Relation Age of Onset  . Cancer Mother   . Hypertension Father   . Hypothyroidism Father   . Diabetes Maternal Grandmother   . Stroke Maternal Grandmother   . Cancer Maternal Grandfather     lung  . Hypertension Paternal Grandmother   . Hyperlipidemia Paternal Grandmother   . Heart disease Paternal Grandmother   . Stroke Paternal Grandfather   . Osteoporosis Neg Hx     Allergies  Allergen Reactions  . Other Other (See Comments)    Anesthesia used in July 2013 caused nausea and vomiting  . Prenatal Vitamins Hives    Patient does not remember the brand of the prenatal vitamin that caused her to have issues.   Sarina Ill [Bactrim] Hives    Medication list has been reviewed and updated.  Current Outpatient  Prescriptions on File Prior to Visit  Medication Sig Dispense Refill  . acetaminophen (TYLENOL) 500 MG tablet Take 1,000 mg by mouth daily as needed (pain/headaches).     . Adapalene-Benzoyl Peroxide (EPIDUO) 0.1-2.5 % gel Apply 1 application topically at bedtime. Apply to face    . amphetamine-dextroamphetamine (ADDERALL) 20 MG tablet Take 10 mg 2 or 3x daily as needed.  Fill 30 days after rx 50 tablet 0  . amphetamine-dextroamphetamine (ADDERALL) 20 MG tablet Take 10 mg 2 or 3x daily as needed 50 tablet 0  . aspirin-acetaminophen-caffeine (EXCEDRIN MIGRAINE) [id number] MG per tablet Take 1 tablet by mouth daily as needed for headache.     . B Complex-C (B-COMPLEX  WITH VITAMIN C) tablet Take 1 tablet by mouth daily.    . Biotin 5 MG TABS Take by mouth.    . Calcium Carbonate-Vitamin D (CALCIUM 600+D) 600-400 MG-UNIT per tablet Take 1 tablet by mouth 2 (two) times daily.    . Dapsone (ACZONE) 5 % topical gel Apply 1 application topically 2 (two) times daily. Apply to face    . doxycycline (ORACEA) 40 MG capsule Take 1 capsule (40 mg total) by mouth every morning. 90 capsule 3  . HYDROcodone-acetaminophen (NORCO) 5-325 MG per tablet Take 1 tablet by mouth every 6 (six) hours as needed for moderate pain. 60 tablet 0  . magnesium oxide (MAG-OX) 400 MG tablet Take 400 mg by mouth daily.    . methocarbamol (ROBAXIN) 500 MG tablet Take 1 tablet (500 mg total) by mouth every 6 (six) hours as needed for muscle spasms. (Patient taking differently: Take 750 mg by mouth every 6 (six) hours as needed for muscle spasms. ) 60 tablet 0  . mometasone (NASONEX) 50 MCG/ACT nasal spray Place 2 sprays into the nose daily.    . Multiple Vitamin (MULTIVITAMIN WITH MINERALS) TABS Take 1 tablet by mouth daily. Centrum    . Olopatadine HCl (PATADAY) 0.2 % SOLN Place 1 drop into both eyes daily as needed (itching/allergies).    . Omega-3 Fatty Acids (FISH OIL) 1200 MG CAPS Take by mouth.     No current facility-administered medications on file prior to visit.    Review of Systems:  As per HPI- otherwise negative.   Physical Examination: Filed Vitals:   12/22/15 0905  BP: 120/78  Pulse: 97  Temp: 98 F (36.7 C)   Filed Vitals:   12/22/15 0905  Height: 5' 0.5" (1.537 m)  Weight: 122 lb 12.8 oz (55.702 kg)   Body mass index is 23.58 kg/(m^2). Ideal Body Weight: Weight in (lb) to have BMI = 25: 129.9  GEN: WDWN, NAD, Non-toxic, A & O x 3, looks well.  HEENT: Atraumatic, Normocephalic. Neck supple. No masses, No LAD. Ears and Nose: No external deformity. CV: RRR, No M/G/R. No JVD. No thrill. No extra heart sounds. PULM: CTA B, no wheezes, crackles, rhonchi. No  retractions. No resp. distress. No accessory muscle use. EXTR: No c/c/e NEURO Normal gait.  PSYCH: Normally interactive. Conversant. Not depressed or anxious appearing.  Calm demeanor.    Assessment and Plan: ADD (attention deficit disorder) - Plan: amphetamine-dextroamphetamine (ADDERALL) 20 MG tablet, amphetamine-dextroamphetamine (ADDERALL) 20 MG tablet, amphetamine-dextroamphetamine (ADDERALL) 20 MG tablet  Refilled her adderall for 3 months Discussed her mother's illness- she is a bit upset but is doing ok,  She will let me know if she needs anything See patient instructions for more details.     Signed Lamar Blinks, MD

## 2015-12-22 NOTE — Patient Instructions (Signed)
It was a pleasure to see you today as always!   Take care and please contact me in about 3 months for a refill, re-check in 6 months

## 2016-01-16 MED FILL — AMPHETAMINE SALTS 20 MG TAB: 20 | 30 days supply | Qty: 45 | Fill #0

## 2016-02-08 DIAGNOSIS — L03011 Cellulitis of right finger: Secondary | ICD-10-CM | POA: Diagnosis not present

## 2016-02-17 MED FILL — CLOBETASOL 0.05% CREAM: 0.05 | 7 days supply | Qty: 15 | Fill #0

## 2016-02-27 ENCOUNTER — Other Ambulatory Visit: Payer: Self-pay | Admitting: Family Medicine

## 2016-02-27 MED FILL — DOXYCYCLINE IR-DR 40 MG CAP: 40 | 90 days supply | Qty: 90 | Fill #3

## 2016-02-27 MED FILL — METHOCARBAMOL 500 MG TABLET: 500 | 15 days supply | Qty: 60 | Fill #0

## 2016-02-27 MED FILL — MOMETASONE FUROATE 50 MCG S: 50 | 90 days supply | Qty: 51 | Fill #2

## 2016-02-27 MED FILL — PATADAY 0.2% EYE DROPS: 0.2 | 75 days supply | Qty: 8 | Fill #0

## 2016-02-27 MED FILL — ACZONE 5% GEL: 5 | 90 days supply | Qty: 90 | Fill #0

## 2016-06-21 ENCOUNTER — Other Ambulatory Visit: Payer: Self-pay | Admitting: Family Medicine

## 2016-06-21 ENCOUNTER — Encounter: Payer: Self-pay | Admitting: Family Medicine

## 2016-06-21 DIAGNOSIS — L700 Acne vulgaris: Secondary | ICD-10-CM

## 2016-06-21 DIAGNOSIS — F988 Other specified behavioral and emotional disorders with onset usually occurring in childhood and adolescence: Secondary | ICD-10-CM

## 2016-06-21 MED ORDER — METHOCARBAMOL 500 MG PO TABS: 500.0000 mg | ORAL_TABLET | Freq: Four times a day (QID) | ORAL | 1 refills | Status: DC | PRN

## 2016-06-21 MED ORDER — AMPHETAMINE-DEXTROAMPHETAMINE 20 MG PO TABS: ORAL_TABLET | ORAL | 0 refills | Status: DC

## 2016-06-21 MED ORDER — DOXYCYCLINE 40 MG PO CPDR: 40.0000 mg | DELAYED_RELEASE_CAPSULE | Freq: Every morning | ORAL | 3 refills | Status: DC

## 2016-06-21 MED FILL — METHOCARBAMOL 500 MG TABLET: 500 | 15 days supply | Qty: 60 | Fill #0

## 2016-06-21 MED FILL — DOXYCYCLINE IR-DR 40 MG CAP: 40 | 90 days supply | Qty: 90 | Fill #0

## 2016-06-25 ENCOUNTER — Ambulatory Visit: Payer: Self-pay | Admitting: Family Medicine

## 2016-06-27 DIAGNOSIS — Z1231 Encounter for screening mammogram for malignant neoplasm of breast: Secondary | ICD-10-CM | POA: Diagnosis not present

## 2016-06-27 MED FILL — DEXTROAMP-AMPHETAMIN 20 MG: 20 | 30 days supply | Qty: 45 | Fill #0

## 2016-07-09 ENCOUNTER — Encounter: Payer: Self-pay | Admitting: Family Medicine

## 2016-07-09 ENCOUNTER — Ambulatory Visit (INDEPENDENT_AMBULATORY_CARE_PROVIDER_SITE_OTHER): Payer: 59 | Admitting: Family Medicine

## 2016-07-09 VITALS — BP 126/82 | HR 83 | Ht 61.0 in | Wt 123.8 lb

## 2016-07-09 DIAGNOSIS — Z8639 Personal history of other endocrine, nutritional and metabolic disease: Secondary | ICD-10-CM

## 2016-07-09 DIAGNOSIS — I7300 Raynaud's syndrome without gangrene: Secondary | ICD-10-CM | POA: Diagnosis not present

## 2016-07-09 DIAGNOSIS — Z1329 Encounter for screening for other suspected endocrine disorder: Secondary | ICD-10-CM

## 2016-07-09 DIAGNOSIS — F909 Attention-deficit hyperactivity disorder, unspecified type: Secondary | ICD-10-CM | POA: Diagnosis not present

## 2016-07-09 DIAGNOSIS — M8580 Other specified disorders of bone density and structure, unspecified site: Secondary | ICD-10-CM

## 2016-07-09 DIAGNOSIS — E875 Hyperkalemia: Secondary | ICD-10-CM

## 2016-07-09 DIAGNOSIS — Z13220 Encounter for screening for lipoid disorders: Secondary | ICD-10-CM

## 2016-07-09 DIAGNOSIS — R635 Abnormal weight gain: Secondary | ICD-10-CM | POA: Diagnosis not present

## 2016-07-09 DIAGNOSIS — R5383 Other fatigue: Secondary | ICD-10-CM

## 2016-07-09 DIAGNOSIS — F988 Other specified behavioral and emotional disorders with onset usually occurring in childhood and adolescence: Secondary | ICD-10-CM

## 2016-07-09 DIAGNOSIS — Z79899 Other long term (current) drug therapy: Secondary | ICD-10-CM | POA: Diagnosis not present

## 2016-07-09 DIAGNOSIS — E559 Vitamin D deficiency, unspecified: Secondary | ICD-10-CM

## 2016-07-09 LAB — LIPID PANEL
CHOLESTEROL: 182 mg/dL (ref 0–200)
HDL: 80.9 mg/dL (ref >39.00)
LDL CALC: 89 mg/dL (ref 0–99)
NonHDL: 101.17
Total CHOL/HDL Ratio: 2
Triglycerides: 63 mg/dL (ref 0.0–149.0)
VLDL: 12.6 mg/dL (ref 0.0–40.0)

## 2016-07-09 LAB — COMPREHENSIVE METABOLIC PANEL
ALBUMIN: 4.5 g/dL (ref 3.5–5.2)
ALK PHOS: 70 U/L (ref 39–117)
ALT: 13 U/L (ref 0–35)
AST: 15 U/L (ref 0–37)
BILIRUBIN TOTAL: 0.6 mg/dL (ref 0.2–1.2)
BUN: 16 mg/dL (ref 6–23)
CO2: 26 mEq/L (ref 19–32)
CREATININE: 0.62 mg/dL (ref 0.40–1.20)
Calcium: 9.6 mg/dL (ref 8.4–10.5)
Chloride: 105 mEq/L (ref 96–112)
GFR: 109.17 mL/min (ref >60.00)
Glucose, Bld: 91 mg/dL (ref 70–99)
Potassium: 5.4 mEq/L — ABNORMAL HIGH (ref 3.5–5.1)
SODIUM: 137 meq/L (ref 135–145)
TOTAL PROTEIN: 7.5 g/dL (ref 6.0–8.3)

## 2016-07-09 LAB — CBC
HCT: 42.1 % (ref 36.0–46.0)
Hemoglobin: 14.4 g/dL (ref 12.0–15.0)
MCHC: 34.2 g/dL (ref 30.0–36.0)
MCV: 86.2 fl (ref 78.0–100.0)
PLATELETS: 229 10*3/uL (ref 150.0–400.0)
RBC: 4.88 Mil/uL (ref 3.87–5.11)
RDW: 12.6 % (ref 11.5–15.5)
WBC: 6.4 10*3/uL (ref 4.0–10.5)

## 2016-07-09 LAB — SEDIMENTATION RATE: Sed Rate: 20 mm/hr (ref 0–20)

## 2016-07-09 LAB — VITAMIN D 25 HYDROXY (VIT D DEFICIENCY, FRACTURES): VITD: 34.26 ng/mL (ref 30.00–100.00)

## 2016-07-09 LAB — TSH: TSH: 3.07 u[IU]/mL (ref 0.35–4.50)

## 2016-07-09 NOTE — Addendum Note (Signed)
Addended by: Lamar Blinks C on: 07/09/2016 09:21 PM   Modules accepted: Orders

## 2016-07-09 NOTE — Patient Instructions (Addendum)
It was very nice to see you today We will do labs and look for any secondary cause of your raynaud's phenomenon.  I will also look for any cause of your fatigue and weight gain. Some weight gain is nearly universal during menopause and may be considered to be normal- don't be too hard on yourself please!

## 2016-07-09 NOTE — Progress Notes (Addendum)
[facility] [address], [place], [place] [address] [phone]  Date:  07/09/2016   Name:  Tiffany Parker   DOB:  [birth year]   MRN:  [id number]  PCP:  Lamar Blinks, MD    Chief Complaint: Follow-up (Pt here for follow up. c/o weight gain and fatigue. )   History of Present Illness:  Tiffany Parker is a 50 y.o. 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 She finds that she does ok during the shift but it takes her a long time to recover once she is done.   She is on ADHD medication- she feels that this is working pretty well for her.  Stable dose for a long time. No sleeplessness or appetite loss, not jittery She will notice that her fingers and toes may turn purple/ blue if cold, and red if she is not cold.  She has noted this for about the last year.  She did not notice it as much over the summer when the weather was warm.  She does not have any pain in her extremities. She does better if she keeps her toes warm with socks. She was not sure if this might be a cause for concern  Wt Readings from Last 3 Encounters:  07/09/16 123 lb 12.8 oz (56.2 kg)  12/22/15 122 lb 12.8 oz (55.7 kg)  09/30/15 121 lb (54.9 kg)   03/2014- 113 lbs She has been stressed with her mom's dementia.  She is concerned about weight gain which she has noted over the last year or so She does exercise 3x a week but admits that she does not work out that hard She feels like she gets more tired than she used to in the past with exercise.  No chest pain.  She would not say that she gets SOB but "I just feel like i'm not in as good shape as I was."  She feels like she is sleeping pretty well, but she feels tired/  sleepy a lot.  She often feels like she could take a nap if given the chance She does not snore more than on occasion per her husband.  She is not really concerned about sleep apnea She has been slim her whole life without really doing anything about it, so it is frustrating to suddenly gain weight  Family history of thyroid issues but no personal history of same  Her menses are changing some- she noted that her menses are becoming lighter.  Wonders if she may be approaching menopause  She was noted to have osteopenia and borderline low Vit D last fall- will recheck her D level today  Patient Active Problem List   Diagnosis Date Noted  . Spinal stenosis of cervical region 05/03/2014  . ADHD (attention deficit hyperactivity disorder) 12/22/2012  . Acne 12/22/2012    Past Medical History:  Diagnosis Date  . ADHD (attention deficit hyperactivity disorder)   . Allergy   . Arthritis   . Blood transfusion    post delivery  . Blood transfusion without reported diagnosis   . DVT (deep venous thrombosis) ([facility]) 04/2012   following spine surgery - right leg  . Headache(784.0)   .  PONV (postoperative nausea and vomiting)   . TMJ (dislocation of temporomandibular joint)     Past Surgical History:  Procedure Laterality Date  . ANTERIOR CERVICAL DECOMP/DISCECTOMY FUSION  05/05/2012   Procedure: ANTERIOR CERVICAL DECOMPRESSION/DISCECTOMY FUSION 1 LEVEL;  Surgeon: Elaina Hoops, MD;  Location: [facility];  Service: Neurosurgery;  Laterality: N/A;  Cervical four-five anterior cervical decompression with fusion interbody prothesis plating and bonegraft  . ANTERIOR CERVICAL DECOMP/DISCECTOMY FUSION N/A 05/03/2014   Procedure: CERVICAL FIVE TO SIX, CERVICAL SIX TO SEVEN ANTERIOR CERVICAL DECOMPRESSION/DISCECTOMY FUSION 2 LEVELS;  Surgeon: Elaina Hoops, MD;  Location: [facility];  Service: Neurosurgery;  Laterality: N/A;  . CESAREAN SECTION    . CHOLECYSTECTOMY    . episiotomy   11/19/97  . episiotomy  repair  11/1997  . EYE SURGERY      Social History  Substance Use Topics  . Smoking status: Never Smoker  . Smokeless tobacco: Not on file  . Alcohol use Yes     Comment: socially    Family History  Problem Relation Age of Onset  . Cancer Mother   . Hypertension Father   . Hypothyroidism Father   . Diabetes Maternal Grandmother   . Stroke Maternal Grandmother   . Cancer Maternal Grandfather     lung  . Hypertension Paternal Grandmother   . Hyperlipidemia Paternal Grandmother   . Heart disease Paternal Grandmother   . Stroke Paternal Grandfather   . Osteoporosis Neg Hx     Allergies  Allergen Reactions  . Other Other (See Comments)    Anesthesia used in July 2013 caused nausea and vomiting  . Prenatal Vitamins Hives    Patient does not remember the brand of the prenatal vitamin that caused her to have issues.   Sarina Ill [Bactrim] Hives    Medication list has been reviewed and updated.  Current Outpatient Prescriptions on File Prior to Visit  Medication Sig Dispense Refill  . acetaminophen (TYLENOL) 500 MG tablet Take 1,000 mg by mouth daily as needed (pain/headaches).     Marland Kitchen ACZONE 5 % topical gel APPLY TOPICALLY 2 TIMES DAILY. 180 g 3  . Adapalene-Benzoyl Peroxide (EPIDUO) 0.1-2.5 % gel Apply 1 application topically at bedtime. Apply to face    . amphetamine-dextroamphetamine (ADDERALL) 20 MG tablet Take 1/2 tablet ('10mg'$ ) 2-3x a day as needed. Ok to fill 60 days after rx 45 tablet 0  . amphetamine-dextroamphetamine (ADDERALL) 20 MG tablet Take 10 mg 2 or 3x daily as needed 45 tablet 0  . amphetamine-dextroamphetamine (ADDERALL) 20 MG tablet Take 10 mg 2 or 3x daily as needed.  Fill 30 days after rx 45 tablet 0  . aspirin-acetaminophen-caffeine (EXCEDRIN MIGRAINE) 675-916-38 MG per tablet Take 1 tablet by mouth daily as needed for headache.     . B Complex-C (B-COMPLEX WITH VITAMIN C) tablet Take 1 tablet by mouth daily.    . Biotin 5 MG TABS Take by mouth.    . Calcium  Carbonate-Vitamin D (CALCIUM 600+D) 600-400 MG-UNIT per tablet Take 1 tablet by mouth 2 (two) times daily.    . cholecalciferol (VITAMIN D) 1000 units tablet Take 1,000 Units by mouth daily.    . Dapsone (ACZONE) 5 % topical gel Apply 1 application topically 2 (two) times daily. Apply to face    . doxycycline (ORACEA) 40 MG capsule Take 1 capsule (40 mg total) by mouth every morning. 90 capsule 3  . HYDROcodone-acetaminophen (NORCO) 5-325 MG per tablet Take 1 tablet by mouth  every 6 (six) hours as needed for moderate pain. 60 tablet 0  . magnesium oxide (MAG-OX) 400 MG tablet Take 400 mg by mouth daily.    . methocarbamol (ROBAXIN) 500 MG tablet Take 1 tablet (500 mg total) by mouth every 6 (six) hours as needed for muscle spasms. 60 tablet 1  . mometasone (NASONEX) 50 MCG/ACT nasal spray Place 2 sprays into the nose daily.    . Multiple Vitamin (MULTIVITAMIN WITH MINERALS) TABS Take 1 tablet by mouth daily. Centrum    . Olopatadine HCl (PATADAY) 0.2 % SOLN Place 1 drop into both eyes daily as needed (itching/allergies).    . Omega-3 Fatty Acids (FISH OIL) 1200 MG CAPS Take by mouth.    Marland Kitchen PATADAY 0.2 % SOLN PLACE 1 DROP IN EACH EYE AS NEEDED FOR DRY/ITCHY EYES OR ALLERGIES 7.5 mL 3   No current facility-administered medications on file prior to visit.     Review of Systems:  As per HPI- otherwise negative. No fever, chills, nausea, vomiting, skin change  Physical Examination: Vitals:   07/09/16 1001  BP: 126/82  Pulse: 83     Vitals:   07/09/16 1001  Weight: 123 lb 12.8 oz (56.2 kg)  Height: '5\' 1"'$  (1.549 m)   Body mass index is 23.39 kg/m. Ideal Body Weight: Weight in (lb) to have BMI = 25: 132  GEN: WDWN, NAD, Non-toxic, A & O x 3, looks well, normal weight HEENT: Atraumatic, Normocephalic. Neck supple. No masses, No LAD. "necklace" scar is from her c spine operation, not her thyroid Ears and Nose: No external deformity. CV: RRR, No M/G/R. No JVD. No thrill. No extra heart  sounds. PULM: CTA B, no wheezes, crackles, rhonchi. No retractions. No resp. distress. No accessory muscle use. ABD: S, NT, ND EXTR: No c/c/e.  At this time her fingers feel a bit cool but are otherwise normal, no ulcerations or discoloration, normal perfusion NEURO Normal gait.  PSYCH: Normally interactive. Conversant. Not depressed or anxious appearing.  Calm demeanor.   Assessment and Plan: ADD (attention deficit disorder)  Screening for hypothyroidism - Plan: TSH  Osteopenia - Plan: Vitamin D (25 hydroxy)  History of vitamin D deficiency - Plan: Vitamin D (25 hydroxy)  Screening for hyperlipidemia - Plan: Lipid panel  Raynaud's phenomenon - Plan: CBC, Comprehensive metabolic panel, TSH, ANA, Rheumatoid Factor, C3 and C4, Sed Rate (ESR)  Weight gain - Plan: TSH  Other fatigue  Here today to followup Reassured that her weight is still quite normal, and some weight gain may be good for her bones. We will check her TSH however if normal suspect her weight gain is due to perimenopause and perhaps stress Will look for any secondary cause of Raynaud's but suspect this is primary  Will plan further follow- up pending labs. UDS today  Signed Lamar Blinks, MD   Labs so far 9/11 Results for orders placed or performed in visit on 07/09/16  CBC  Result Value Ref Range   WBC 6.4 4.0 - 10.5 K/uL   RBC 4.88 3.87 - 5.11 Mil/uL   Platelets 229.0 150.0 - 400.0 K/uL   Hemoglobin 14.4 12.0 - 15.0 g/dL   HCT 42.1 36.0 - 46.0 %   MCV 86.2 78.0 - 100.0 fl   MCHC 34.2 30.0 - 36.0 g/dL   RDW 12.6 11.5 - 15.5 %  Comprehensive metabolic panel  Result Value Ref Range   Sodium 137 135 - 145 mEq/L   Potassium 5.4 (H) 3.5 - 5.1  mEq/L   Chloride 105 96 - 112 mEq/L   CO2 26 19 - 32 mEq/L   Glucose, Bld 91 70 - 99 mg/dL   BUN 16 6 - 23 mg/dL   Creatinine, Ser 0.62 0.40 - 1.20 mg/dL   Total Bilirubin 0.6 0.2 - 1.2 mg/dL   Alkaline Phosphatase 70 39 - 117 U/L   AST 15 0 - 37 U/L   ALT 13  0 - 35 U/L   Total Protein 7.5 6.0 - 8.3 g/dL   Albumin 4.5 3.5 - 5.2 g/dL   Calcium 9.6 8.4 - 10.5 mg/dL   GFR 109.17 >60.00 mL/min  Lipid panel  Result Value Ref Range   Cholesterol 182 0 - 200 mg/dL   Triglycerides 63.0 0.0 - 149.0 mg/dL   HDL 80.90 >39.00 mg/dL   VLDL 12.6 0.0 - 40.0 mg/dL   LDL Cholesterol 89 0 - 99 mg/dL   Total CHOL/HDL Ratio 2    NonHDL 101.17   TSH  Result Value Ref Range   TSH 3.07 0.35 - 4.50 uIU/mL  Vitamin D (25 hydroxy)  Result Value Ref Range   VITD 34.26 30.00 - 100.00 ng/mL  Sed Rate (ESR)  Result Value Ref Range   Sed Rate 20 0 - 20 mm/hr   mychart message: Your cholesterol looks great, vitamin D is normal (keep taking the daily OTC supplement), and so far I see no possible cause of Raynaud's syndrome.   Your potassium is a bit high; not high enough to be dangerous but I would like to recheck it in a week or two.  I'll order a potassium level for you in epic as a future order; do you think they could just draw this for you at work?  If not you can come to the office for a lab visit only

## 2016-07-10 ENCOUNTER — Encounter: Payer: Self-pay | Admitting: Family Medicine

## 2016-07-10 LAB — ANTI-NUCLEAR AB-TITER (ANA TITER)

## 2016-07-10 LAB — ANA: Anti Nuclear Antibody(ANA): POSITIVE — AB

## 2016-07-10 LAB — C3 AND C4
C3 Complement: 154 mg/dL (ref 90–180)
C4 Complement: 25 mg/dL (ref 16–47)

## 2016-07-10 LAB — RHEUMATOID FACTOR

## 2016-07-12 ENCOUNTER — Encounter: Payer: Self-pay | Admitting: Family Medicine

## 2016-07-12 DIAGNOSIS — M2550 Pain in unspecified joint: Secondary | ICD-10-CM

## 2016-07-23 ENCOUNTER — Other Ambulatory Visit (INDEPENDENT_AMBULATORY_CARE_PROVIDER_SITE_OTHER): Payer: 59

## 2016-07-23 DIAGNOSIS — E875 Hyperkalemia: Secondary | ICD-10-CM | POA: Diagnosis not present

## 2016-07-23 LAB — POTASSIUM: Potassium: 4.1 mEq/L (ref 3.5–5.1)

## 2016-07-30 MED FILL — DEXTROAMP-AMPHETAMIN 20 MG: 20 | 30 days supply | Qty: 45 | Fill #0

## 2016-08-16 DIAGNOSIS — M2550 Pain in unspecified joint: Secondary | ICD-10-CM | POA: Diagnosis not present

## 2016-08-16 DIAGNOSIS — I7300 Raynaud's syndrome without gangrene: Secondary | ICD-10-CM | POA: Diagnosis not present

## 2016-08-16 DIAGNOSIS — R768 Other specified abnormal immunological findings in serum: Secondary | ICD-10-CM | POA: Diagnosis not present

## 2016-08-30 DIAGNOSIS — Z01419 Encounter for gynecological examination (general) (routine) without abnormal findings: Secondary | ICD-10-CM | POA: Diagnosis not present

## 2016-09-27 ENCOUNTER — Encounter: Payer: Self-pay | Admitting: Family Medicine

## 2016-09-27 DIAGNOSIS — F988 Other specified behavioral and emotional disorders with onset usually occurring in childhood and adolescence: Secondary | ICD-10-CM

## 2016-09-27 MED ORDER — AMPHETAMINE-DEXTROAMPHETAMINE 20 MG PO TABS: ORAL_TABLET | ORAL | 0 refills | Status: DC

## 2016-09-28 ENCOUNTER — Other Ambulatory Visit: Payer: Self-pay | Admitting: Emergency Medicine

## 2016-09-28 ENCOUNTER — Ambulatory Visit: Payer: 59 | Admitting: Endocrinology

## 2016-09-28 MED ORDER — MOMETASONE FUROATE 50 MCG/ACT NA SUSP: 2.0000 | Freq: Every day | NASAL | 2 refills | Status: DC

## 2016-09-28 MED ORDER — ADAPALENE-BENZOYL PEROXIDE 0.1-2.5 % EX GEL: 1.0000 "application " | Freq: Every day | CUTANEOUS | 1 refills | Status: DC

## 2016-09-28 MED FILL — ADAPALENE-BNZYL PEROX 0.1-2: 0.1-2.5 | 30 days supply | Qty: 45 | Fill #0

## 2016-09-28 MED FILL — ACZONE 5% GEL: 5 | 90 days supply | Qty: 90 | Fill #1

## 2016-09-28 MED FILL — DOXYCYCLINE IR-DR 40 MG CAP: 40 | 90 days supply | Qty: 90 | Fill #1

## 2016-09-28 MED FILL — MOMETASONE FUROATE 50 MCG S: 50 | 30 days supply | Qty: 17 | Fill #0

## 2016-09-28 MED FILL — METHOCARBAMOL 500 MG TABLET: 500 | 15 days supply | Qty: 60 | Fill #1

## 2016-09-28 NOTE — Telephone Encounter (Signed)
Received med refill for both EPIDUO GEL PUMP AND MOMETASONE FUROATE 50 MCG from [facility]. Both refills have been sent to pharmacy.

## 2016-10-01 MED FILL — DEXTROAMP-AMPHETAMIN 20 MG: 20 | 30 days supply | Qty: 45 | Fill #0

## 2016-10-17 ENCOUNTER — Ambulatory Visit (INDEPENDENT_AMBULATORY_CARE_PROVIDER_SITE_OTHER): Payer: 59 | Admitting: Endocrinology

## 2016-10-17 ENCOUNTER — Encounter: Payer: Self-pay | Admitting: Endocrinology

## 2016-10-17 VITALS — BP 122/80 | HR 98 | Ht 61.0 in | Wt 121.0 lb

## 2016-10-17 DIAGNOSIS — M8580 Other specified disorders of bone density and structure, unspecified site: Secondary | ICD-10-CM | POA: Diagnosis not present

## 2016-10-17 DIAGNOSIS — E559 Vitamin D deficiency, unspecified: Secondary | ICD-10-CM

## 2016-10-17 NOTE — Progress Notes (Signed)
Patient ID: Tiffany Parker, female   DOB: July 05, 1966, 50 y.o.   MRN: [id number]           Chief complaint: Follow-up of low bone mass  Referring physician: Dr. Lorelei Pont  History of Present Illness:  The patient was referred here for low bone mass by her PCP. She had a screening bone density done because of having a toe fracture and her podiatrist felt that her bones looked osteopenic on regular x-ray. Bone density showed the following Z-scores:  Femoral neck: -2.4  Spine: Normal FRAX fracture risk cannot be calculated for premenopausal women  She has no history of low trauma fracture or height loss.  She had a toe fracture when a heavy television set fell on it Menopausal status: She is still premenstrual Currently has no bone pain, no further fractures  Calcium supplements: Currently taking, usually one a day Vitamin D supplements: 1000 units vitamin D3 in addition to her multivitamin  Lab evaluation: Vitamin D level 29 baseline, follow-up 34 in September    Past Medical History:  Diagnosis Date  . ADHD (attention deficit hyperactivity disorder)   . Allergy   . Arthritis   . Blood transfusion    post delivery  . Blood transfusion without reported diagnosis   . DVT (deep venous thrombosis) ([facility]) 04/2012   following spine surgery - right leg  . Headache(784.0)   . PONV (postoperative nausea and vomiting)   . TMJ (dislocation of temporomandibular joint)     Past Surgical History:  Procedure Laterality Date  . ANTERIOR CERVICAL DECOMP/DISCECTOMY FUSION  05/05/2012   Procedure: ANTERIOR CERVICAL DECOMPRESSION/DISCECTOMY FUSION 1 LEVEL;  Surgeon: Elaina Hoops, MD;  Location: [facility];  Service: Neurosurgery;  Laterality: N/A;  Cervical four-five anterior cervical decompression with fusion interbody prothesis plating and bonegraft  . ANTERIOR CERVICAL DECOMP/DISCECTOMY FUSION N/A 05/03/2014   Procedure: CERVICAL FIVE TO SIX, CERVICAL SIX TO SEVEN ANTERIOR CERVICAL  DECOMPRESSION/DISCECTOMY FUSION 2 LEVELS;  Surgeon: Elaina Hoops, MD;  Location: [facility];  Service: Neurosurgery;  Laterality: N/A;  . CESAREAN SECTION    . CHOLECYSTECTOMY    . episiotomy   11/19/97  . episiotomy repair  11/1997  . EYE SURGERY      Family History  Problem Relation Age of Onset  . Cancer Mother   . Hypertension Father   . Hypothyroidism Father   . Diabetes Maternal Grandmother   . Stroke Maternal Grandmother   . Cancer Maternal Grandfather     lung  . Hypertension Paternal Grandmother   . Hyperlipidemia Paternal Grandmother   . Heart disease Paternal Grandmother   . Stroke Paternal Grandfather   . Osteoporosis Neg Hx     Social History:  reports that she has never smoked. She does not have any smokeless tobacco history on file. She reports that she drinks alcohol. She reports that she does not use drugs.  Allergies:  Allergies  Allergen Reactions  . Other Other (See Comments)    Anesthesia used in July 2013 caused nausea and vomiting  . Prenatal Vitamins Hives    Patient does not remember the brand of the prenatal vitamin that caused her to have issues.   . Septra [Bactrim] Hives    Allergies as of 10/17/2016      Reactions   Other Other (See Comments)   Anesthesia used in July 2013 caused nausea and vomiting   Prenatal Vitamins Hives   Patient does not remember the brand of the prenatal vitamin  that caused her to have issues.    Septra [bactrim] Hives      Medication List       Accurate as of 10/17/16  2:37 PM. Always use your most recent med list.          acetaminophen 500 MG tablet Commonly known as:  TYLENOL Take 1,000 mg by mouth daily as needed (pain/headaches).   ACZONE 5 % topical gel Generic drug:  Dapsone Apply 1 application topically 2 (two) times daily. Apply to face   ACZONE 5 % topical gel Generic drug:  Dapsone APPLY TOPICALLY 2 TIMES DAILY.   Adapalene-Benzoyl Peroxide 0.1-2.5 % gel Commonly known as:  EPIDUO Apply  1 application topically at bedtime. Apply to face   amphetamine-dextroamphetamine 20 MG tablet Commonly known as:  ADDERALL Take 1/2 tablet (10mg ) 2-3x a day as needed. Ok to fill 60 days after rx   amphetamine-dextroamphetamine 20 MG tablet Commonly known as:  ADDERALL Take 10 mg 2 or 3x daily as needed   amphetamine-dextroamphetamine 20 MG tablet Commonly known as:  ADDERALL Take 10 mg 2 or 3x daily as needed.  Fill 30 days after rx   aspirin-acetaminophen-caffeine 250-250-65 MG tablet Commonly known as:  EXCEDRIN MIGRAINE Take 1 tablet by mouth daily as needed for headache.   B-complex with vitamin C tablet Take 1 tablet by mouth daily.   Biotin 5 MG Tabs Take by mouth.   CALCIUM 600+D 600-400 MG-UNIT tablet Generic drug:  Calcium Carbonate-Vitamin D Take 1 tablet by mouth 2 (two) times daily.   cholecalciferol 1000 units tablet Commonly known as:  VITAMIN D Take 1,000 Units by mouth daily.   doxycycline 40 MG capsule Commonly known as:  ORACEA Take 1 capsule (40 mg total) by mouth every morning.   Fish Oil 1200 MG Caps Take by mouth.   HYDROcodone-acetaminophen 5-325 MG tablet Commonly known as:  NORCO Take 1 tablet by mouth every 6 (six) hours as needed for moderate pain.   magnesium oxide 400 MG tablet Commonly known as:  MAG-OX Take 400 mg by mouth daily.   methocarbamol 500 MG tablet Commonly known as:  ROBAXIN Take 1 tablet (500 mg total) by mouth every 6 (six) hours as needed for muscle spasms.   mometasone 50 MCG/ACT nasal spray Commonly known as:  NASONEX Place 2 sprays into the nose daily.   multivitamin with minerals Tabs tablet Take 1 tablet by mouth daily. Centrum   PATADAY 0.2 % Soln Generic drug:  Olopatadine HCl Place 1 drop into both eyes daily as needed (itching/allergies).   PATADAY 0.2 % Soln Generic drug:  Olopatadine HCl PLACE 1 DROP IN EACH EYE AS NEEDED FOR DRY/ITCHY EYES OR ALLERGIES   vitamin A 7500 UNIT capsule Take  7,500 Units by mouth daily.        Review of Systems     LABS:   No visits with results within 1 Week(s) from this visit.  Latest known visit with results is:  Appointment on 07/23/2016  Component Date Value Ref Range Status  . Potassium 07/23/2016 4.1  3.5 - 5.1 mEq/L Final     PHYSICAL EXAM:  BP 122/80   Pulse 98   Ht 5\' 1"  (1.549 m)   Wt 121 lb (54.9 kg)   SpO2 98%   BMI 22.86 kg/m      ASSESSMENT:   LOW BONE MASS:  She has Had an asymptomatic slightly low bone mass with Z score of -2.4  Has not had any  clinical problems since her initial evaluation Is taking her vitamin D but her levels about 2 months ago was still low normal at 34     PLAN:   Recommend repeat bone density but she will try to find out if this is covered by insurance within 2 years Increase vitamin D3 up to 2000 units a day Follow-up in one year    Patient Instructions  2000 Units D3      [facility] 10/17/2016, 2:37 PM    Addendum: Labs normal  Lab Results  Component Value Date   TSH 3.07 07/09/2016

## 2016-10-17 NOTE — Patient Instructions (Signed)
2000 Units D3

## 2016-10-18 ENCOUNTER — Encounter: Payer: Self-pay | Admitting: Family Medicine

## 2016-10-18 DIAGNOSIS — M8580 Other specified disorders of bone density and structure, unspecified site: Secondary | ICD-10-CM

## 2016-11-21 MED FILL — AMPHETAMINE SALTS 20 MG TAB: 20 | 30 days supply | Qty: 45 | Fill #0

## 2016-11-21 MED FILL — MOMETASONE FUROATE 50 MCG S: 50 | 30 days supply | Qty: 17 | Fill #1

## 2016-11-29 ENCOUNTER — Other Ambulatory Visit: Payer: Self-pay | Admitting: Emergency Medicine

## 2016-11-30 ENCOUNTER — Other Ambulatory Visit: Payer: Self-pay | Admitting: Emergency Medicine

## 2016-11-30 MED ORDER — METHOCARBAMOL 500 MG PO TABS: 500.0000 mg | ORAL_TABLET | Freq: Four times a day (QID) | ORAL | 1 refills | Status: AC | PRN

## 2016-11-30 MED FILL — METHOCARBAMOL 500 MG TABLET: 500 | 15 days supply | Qty: 60 | Fill #0

## 2016-12-07 ENCOUNTER — Encounter: Payer: Self-pay | Admitting: Family Medicine

## 2017-01-01 MED FILL — DOXYCYCLINE IR-DR 40 MG CAP: 40 | 30 days supply | Qty: 30 | Fill #2

## 2017-01-04 MED FILL — AMPHETAMINE SALTS 20 MG TAB: 20 | 30 days supply | Qty: 45 | Fill #0

## 2017-01-20 NOTE — Progress Notes (Signed)
[place] at [facility] [address] [place], [place] [address] [phone]  Date:  01/21/2017   Name:  Tiffany Parker   DOB:  Nov 27, 1965   MRN:  [id number]  PCP:  Lamar Blinks, MD    Chief Complaint: Follow-up (Pt here for ADD f/u, currently using Adderal and would like to discuss. ) and Hip Pain (c/o left hip and left shoulder pain, sx's off and on over 6 months. Pt denies any injury. Not currently taking any otc for sx's.)   History of Present Illness:  Tiffany Parker is a 51 y.o. very pleasant female patient who presents with the following: Last seen by myself in September of last year with the following partial HPI  She has been stressed with her mom's dementia.  She is concerned about weight gain which she has noted over the last year or so She does exercise 3x a week but admits that she does not work out that hard She feels like she gets more tired than she used to in the past with exercise.  No chest pain.  She would not say that she gets SOB but "I just feel like i'm not in as good shape as I was."  She feels like she is sleeping pretty well, but she feels tired/ sleepy a lot.  She often feels like she could take a nap if given the chance She does not snore more than on occasion per her husband.  She is not really concerned about sleep apnea She has been slim her whole life without really doing anything about it, so it is frustrating to suddenly gain weight  Her mother did pass away shortly before Christmas - she is handling this ok.  Her step father is still living in their home.  Jacqlyn Larsen is taking care of disposing of her mother's things however  NCC[facility]: most recent fill of her adderall on 3/9- she generally fills about every 6 weeks.  No concerning entries notes.  She does need to do a contract today- completed  She had a low risk UDS 6 months go- will repeat in September for her  She has been on oracea 30 mg once a day  for acne but this got more expensive- per her derm recommendations will change to 20 mg of doxy BID  She is usually taking adderall 10 mg BID right now  She has noted a problem with her left shoulder- over the last few months. She will have pain with internal and external rotation. She is not aware of any particular injury or any change in her routine. She will have to avoid sleeping on her left side.  She will often have pain with reaching back to do up her bra.  No weakness noted however  Patient Active Problem List   Diagnosis Date Noted  . Low bone mass 07/09/2016  . Vitamin D deficiency 07/09/2016  . Spinal stenosis of cervical region 05/03/2014  . ADHD (attention deficit hyperactivity disorder) 12/22/2012  . Acne 12/22/2012    Past Medical History:  Diagnosis Date  . ADHD (attention deficit hyperactivity disorder)   . Allergy   . Arthritis   . Blood transfusion    post delivery  . Blood transfusion without reported diagnosis   . DVT (deep venous thrombosis) ([place]) 04/2012   following spine surgery - right leg  . Headache(784.0)   . PONV (postoperative nausea and vomiting)   . TMJ (dislocation of  temporomandibular joint)     Past Surgical History:  Procedure Laterality Date  . ANTERIOR CERVICAL DECOMP/DISCECTOMY FUSION  05/05/2012   Procedure: ANTERIOR CERVICAL DECOMPRESSION/DISCECTOMY FUSION 1 LEVEL;  Surgeon: Elaina Hoops, MD;  Location: [facility];  Service: Neurosurgery;  Laterality: N/A;  Cervical four-five anterior cervical decompression with fusion interbody prothesis plating and bonegraft  . ANTERIOR CERVICAL DECOMP/DISCECTOMY FUSION N/A 05/03/2014   Procedure: CERVICAL FIVE TO SIX, CERVICAL SIX TO SEVEN ANTERIOR CERVICAL DECOMPRESSION/DISCECTOMY FUSION 2 LEVELS;  Surgeon: Elaina Hoops, MD;  Location: [facility];  Service: Neurosurgery;  Laterality: N/A;  . CESAREAN SECTION    . CHOLECYSTECTOMY    . episiotomy   11/19/97  . episiotomy repair  11/1997  . EYE SURGERY       Social History  Substance Use Topics  . Smoking status: Never Smoker  . Smokeless tobacco: Not on file  . Alcohol use Yes     Comment: socially    Family History  Problem Relation Age of Onset  . Cancer Mother   . Hypertension Father   . Hypothyroidism Father   . Diabetes Maternal Grandmother   . Stroke Maternal Grandmother   . Cancer Maternal Grandfather     lung  . Hypertension Paternal Grandmother   . Hyperlipidemia Paternal Grandmother   . Heart disease Paternal Grandmother   . Stroke Paternal Grandfather   . Osteoporosis Neg Hx     Allergies  Allergen Reactions  . Other Other (See Comments)    Anesthesia used in July 2013 caused nausea and vomiting  . Prenatal Vitamins Hives    Patient does not remember the brand of the prenatal vitamin that caused her to have issues.   Sarina Ill [Bactrim] Hives    Medication list has been reviewed and updated.  Current Outpatient Prescriptions on File Prior to Visit  Medication Sig Dispense Refill  . acetaminophen (TYLENOL) 500 MG tablet Take 1,000 mg by mouth daily as needed (pain/headaches).     Marland Kitchen ACZONE 5 % topical gel APPLY TOPICALLY 2 TIMES DAILY. 180 g 3  . Adapalene-Benzoyl Peroxide (EPIDUO) 0.1-2.5 % gel Apply 1 application topically at bedtime. Apply to face 45 g 1  . aspirin-acetaminophen-caffeine (EXCEDRIN MIGRAINE) 250-250-65 MG per tablet Take 1 tablet by mouth daily as needed for headache.     . B Complex-C (B-COMPLEX WITH VITAMIN C) tablet Take 1 tablet by mouth daily.    . Biotin 5 MG TABS Take by mouth.    . Calcium Carbonate-Vitamin D (CALCIUM 600+D) 600-400 MG-UNIT per tablet Take 1 tablet by mouth 2 (two) times daily.    . cholecalciferol (VITAMIN D) 1000 units tablet Take 1,000 Units by mouth daily.    . Dapsone (ACZONE) 5 % topical gel Apply 1 application topically 2 (two) times daily. Apply to face    . doxycycline (ORACEA) 40 MG capsule Take 1 capsule (40 mg total) by mouth every morning. 90 capsule 3   . HYDROcodone-acetaminophen (NORCO) 5-325 MG per tablet Take 1 tablet by mouth every 6 (six) hours as needed for moderate pain. 60 tablet 0  . magnesium oxide (MAG-OX) 400 MG tablet Take 400 mg by mouth daily.    . methocarbamol (ROBAXIN) 500 MG tablet Take 1 tablet (500 mg total) by mouth every 6 (six) hours as needed for muscle spasms. 60 tablet 1  . mometasone (NASONEX) 50 MCG/ACT nasal spray Place 2 sprays into the nose daily. 17 g 2  . Multiple Vitamin (MULTIVITAMIN WITH MINERALS)  TABS Take 1 tablet by mouth daily. Centrum    . Olopatadine HCl (PATADAY) 0.2 % SOLN Place 1 drop into both eyes daily as needed (itching/allergies).    . Omega-3 Fatty Acids (FISH OIL) 1200 MG CAPS Take by mouth.    Marland Kitchen PATADAY 0.2 % SOLN PLACE 1 DROP IN EACH EYE AS NEEDED FOR DRY/ITCHY EYES OR ALLERGIES 7.5 mL 3  . vitamin A 7500 UNIT capsule Take 7,500 Units by mouth daily.     No current facility-administered medications on file prior to visit.     Review of Systems:  As per HPI- otherwise negative.   Physical Examination: Vitals:   01/21/17 1029  BP: 112/84  Pulse: 85  Temp: 98.2 F (36.8 C)   Vitals:   01/21/17 1029  Weight: 122 lb 6.4 oz (55.5 kg)  Height: 5\' 1"  (1.549 m)   Body mass index is 23.13 kg/m. Ideal Body Weight: Weight in (lb) to have BMI = 25: 132  GEN: WDWN, NAD, Non-toxic, A & O x 3, normal weight, looks well HEENT: Atraumatic, Normocephalic. Neck supple. No masses, No LAD. Ears and Nose: No external deformity. CV: RRR, No M/G/R. No JVD. No thrill. No extra heart sounds. PULM: CTA B, no wheezes, crackles, rhonchi. No retractions. No resp. distress. No accessory muscle use. EXTR: No c/c/e NEURO Normal gait.  PSYCH: Normally interactive. Conversant. Not depressed or anxious appearing.  Calm demeanor.  Left shoulder:  She starts to have pain with flexion and abduction during the last 10-15 degrees of her ROM. Her internal rotation is also limited/ pain with Neer's testing.   Negative empty can test.  Strength of BUE is otherwise normal     Assessment and Plan: Attention deficit disorder, unspecified hyperactivity presence  Rosacea - Plan: doxycycline (PERIOSTAT) 20 MG tablet  Rotator cuff tendonitis, left - Plan: Ambulatory referral to Physical Therapy  Attention deficit disorder (ADD) without hyperactivity - Plan: amphetamine-dextroamphetamine (ADDERALL) 20 MG tablet, amphetamine-dextroamphetamine (ADDERALL) 20 MG tablet, amphetamine-dextroamphetamine (ADDERALL) 20 MG tablet  Referral to PT regarding her left shoulder problem.  Advised that she likely has rotator cuff tendonitis Refilled her adderall for 3 months Changed to doxycycline 20 BID Plan for CPE in September  Signed Lamar Blinks, MD

## 2017-01-21 ENCOUNTER — Ambulatory Visit (INDEPENDENT_AMBULATORY_CARE_PROVIDER_SITE_OTHER): Payer: 59 | Admitting: Family Medicine

## 2017-01-21 VITALS — BP 112/84 | HR 85 | Temp 98.2°F | Ht 61.0 in | Wt 122.4 lb

## 2017-01-21 DIAGNOSIS — M7582 Other shoulder lesions, left shoulder: Secondary | ICD-10-CM

## 2017-01-21 DIAGNOSIS — F988 Other specified behavioral and emotional disorders with onset usually occurring in childhood and adolescence: Secondary | ICD-10-CM

## 2017-01-21 DIAGNOSIS — L719 Rosacea, unspecified: Secondary | ICD-10-CM | POA: Diagnosis not present

## 2017-01-21 MED ORDER — DOXYCYCLINE HYCLATE 20 MG PO TABS: 20.0000 mg | ORAL_TABLET | Freq: Two times a day (BID) | ORAL | 3 refills | Status: DC

## 2017-01-21 MED ORDER — AMPHETAMINE-DEXTROAMPHETAMINE 20 MG PO TABS: ORAL_TABLET | ORAL | 0 refills | Status: DC

## 2017-01-21 MED FILL — DOXYCYCLINE HYC 20 MG TAB: 20 | 90 days supply | Qty: 180 | Fill #0

## 2017-01-21 NOTE — Patient Instructions (Signed)
It was good to see you again today- take care and let me know if you have any concerns I changed your doxy to 20 mg BID for your rosacea We will refer you to PT to work on your left shoulder problem.   I refilled your adderall today- 3 rx  Please see me in about 6 months

## 2017-01-24 ENCOUNTER — Ambulatory Visit: Payer: 59 | Attending: Family Medicine | Admitting: Physical Therapy

## 2017-01-24 DIAGNOSIS — M25512 Pain in left shoulder: Secondary | ICD-10-CM | POA: Insufficient documentation

## 2017-01-24 DIAGNOSIS — R293 Abnormal posture: Secondary | ICD-10-CM | POA: Diagnosis not present

## 2017-01-24 DIAGNOSIS — M25612 Stiffness of left shoulder, not elsewhere classified: Secondary | ICD-10-CM | POA: Insufficient documentation

## 2017-01-24 NOTE — Therapy (Signed)
[facility] [address]  [place], [place], [address] Phone: [phone]   Fax:  [phone]  Physical Therapy Treatment  Patient Details  Name: Tiffany Parker MRN: [id number] Date of Birth: [birth year] Referring Provider: Lamar Blinks, MD  Encounter Date: 01/24/2017      PT End of Session - 01/24/17 1353    Visit Number 1   Number of Visits 12   Date for PT Re-Evaluation 03/08/17   PT Start Time 1307   PT Stop Time 1352   PT Time Calculation (min) 45 min   Activity Tolerance Patient tolerated treatment well   Behavior During Therapy [facility] for tasks assessed/performed      Past Medical History:  Diagnosis Date  . ADHD (attention deficit hyperactivity disorder)   . Allergy   . Arthritis   . Blood transfusion    post delivery  . Blood transfusion without reported diagnosis   . DVT (deep venous thrombosis) ([place]) 04/2012   following spine surgery - right leg  . Headache(784.0)   . PONV (postoperative nausea and vomiting)   . TMJ (dislocation of temporomandibular joint)     Past Surgical History:  Procedure Laterality Date  . ANTERIOR CERVICAL DECOMP/DISCECTOMY FUSION  05/05/2012   Procedure: ANTERIOR CERVICAL DECOMPRESSION/DISCECTOMY FUSION 1 LEVEL;  Surgeon: Elaina Hoops, MD;  Location: [place] [facility];  Service: Neurosurgery;  Laterality: N/A;  Cervical four-five anterior cervical decompression with fusion interbody prothesis plating and bonegraft  . ANTERIOR CERVICAL DECOMP/DISCECTOMY FUSION N/A 05/03/2014   Procedure: CERVICAL FIVE TO SIX, CERVICAL SIX TO SEVEN ANTERIOR CERVICAL DECOMPRESSION/DISCECTOMY FUSION 2 LEVELS;  Surgeon: Elaina Hoops, MD;  Location: [place] [facility];  Service: Neurosurgery;  Laterality: N/A;  . CESAREAN SECTION    . CHOLECYSTECTOMY    . episiotomy   11/19/97  . episiotomy repair  11/1997  . EYE SURGERY      There were no vitals filed for this visit.      Subjective Assessment -  01/24/17 1308    Subjective Pt c/o L shoulder pain worsening over past several months. Pain mostly with ER/IR and can be minimized by pressing on anterior shoulder. MD believes it is RTC tendinitis but pt not sure.   Pertinent History 2 cervical spinal fusions - most recent C3-7   Patient Stated Goals "To be able to use my arm with no pain & avoid surgery."   Currently in Pain? No/denies   Pain Score 0-No pain  least 0/10 (rest); up to 8/10 when using L arm or trying to sleep on L side   Pain Location Shoulder   Pain Orientation Left   Pain Descriptors / Indicators Aching;Sore   Pain Type Acute pain   Pain Radiating Towards mid upper arm   Pain Onset More than a month ago   Pain Frequency Intermittent   Aggravating Factors  reaching up and out or behind the back, opening a door   Pain Relieving Factors muscle relaxant, inactivity/rest   Effect of Pain on Daily Activities difficulty with dressing - hooking bra, shirt over head; avoids use of L arm during chores            [facility] PT Assessment - 01/24/17 1307      Assessment   Medical Diagnosis L RTC tendinitis   Referring Provider Lamar Blinks, MD   Onset Date/Surgical Date --  ~July-Aug 2017, worsening over past 3 months   Hand Dominance Right   Next MD  Visit none scheduled   Prior Therapy PT 2013 & 2017 for cervical spine     Balance Screen   Has the patient fallen in the past 6 months No   Has the patient had a decrease in activity level because of a fear of falling?  No   Is the patient reluctant to leave their home because of a fear of falling?  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[employer], swimming, read, gym 2-3x/wk (has not gone since last summer)     Observation/Other Assessments   Focus on Therapeutic Outcomes  (FOTO)  Shoulder - 50% (50% limitation); 67% (33% limitation)     Posture/Postural Control   Posture/Postural Control Postural limitations   Postural Limitations Forward head;Rounded Shoulders   Posture Comments L shoulder very protracted     ROM / Strength   AROM / PROM / Strength AROM;Strength;PROM     AROM   AROM Assessment Site Shoulder   Right/Left Shoulder Right;Left   Right Shoulder Flexion 159 Degrees   Right Shoulder ABduction 170 Degrees   Right Shoulder Internal Rotation 90 Degrees   Right Shoulder External Rotation 92 Degrees   Left Shoulder Flexion 140 Degrees   Left Shoulder ABduction 126 Degrees   Left Shoulder Internal Rotation 69 Degrees   Left Shoulder External Rotation 80 Degrees     PROM   Overall PROM Comments PROM equivalent to AROM and limted by pain     Strength   Strength Assessment Site Shoulder;Hand   Right/Left Shoulder Right;Left   Right Shoulder Flexion 4/5   Right Shoulder ABduction 4/5   Right Shoulder Internal Rotation 4/5   Right Shoulder External Rotation 4/5   Left Shoulder Flexion 4-/5   Left Shoulder ABduction 3+/5   Left Shoulder Internal Rotation 4-/5   Left Shoulder External Rotation 4-/5   Right/Left hand Right;Left   Right Hand Grip (lbs) 39, 41, 42   Left Hand Grip (lbs) 40, 36, 35     Palpation   Palpation comment ttp over coracoid process, deltoids and inferior posterior capsule                     [facility] Adult PT Treatment/Exercise - 01/24/17 1307      Exercises   Exercises Shoulder     Shoulder Exercises: Supine   Horizontal ABduction Both;10 reps;Theraband   Theraband Level (Shoulder Horizontal ABduction) Level 1 (Yellow)   Horizontal ABduction Limitations hooklying on pool noodle   External Rotation Both;5 reps;Theraband   Theraband Level (Shoulder External Rotation) Level 1 (Yellow)   External Rotation Limitations hooklying on pool noodle - discontinued d/t increased pain     Shoulder Exercises:  Standing   Retraction Both;10 reps  10" hold   Retraction Limitations against pool noodle on wall     Shoulder Exercises: Stretch   Corner Stretch 30 seconds;1 rep   Corner Stretch Limitations 3 way doorway stretch   Other Shoulder Stretches Hooklying pec stretch over pool noodle x60"                PT Education - 01/24/17 1356    Education provided Yes   Education Details PT eval findings, POC, postural education for neutral shoulder alignment & initial HEP  Person(s) Educated Patient   Methods Explanation;Demonstration;Handout   Comprehension Verbalized understanding;Returned demonstration;Need further instruction          PT Short Term Goals - 01/24/17 1832      PT SHORT TERM GOAL #1   Title Independent with initial HEP by 02/08/17   Status New           PT Long Term Goals - 01/24/17 1833      PT LONG TERM GOAL #1   Title Independent with advanced HEP +/- gym program as indicated by 03/08/17   Status New     PT LONG TERM GOAL #2   Title L shoulder AROM within 5-10 dg of R w/o pain by 03/08/17   Status New     PT LONG TERM GOAL #3   Title L shoulder MMT >/= 4+/5 w/o pain by 03/08/17   Status New     PT LONG TERM GOAL #4   Title Pt will report ablility to perform ADL's including dressing/undressing and fastening bra w/o limitation due to L shoulder pain or LOM by 03/08/17   Status New     PT LONG TERM GOAL #5   Title Pt will report normal completion of household chores and job tasks including overhead reach/lift w/o limitation due to L shoulder pain or LOM by 03/08/17   Status New               Plan - 01/24/17 1358    Clinical Impression Statement Corene is 51 y/o female who presents to OP PT for a low complexity eval RTC tendinitis with L shoulder pain dating back to summer 2017, and worsening over the past 3 months. Pain worst (up to 8/10) with overhead reaching or reaching away from body and limits dressing, especially fastening bra and  removing a shirt overhead; but typically no pain at rest. Assessment revealed significant forward rounded shoulder posture L>R with scapula protracted and rounded forward on ribcage. L shoulder ROM limited in all planes with tight end-feel and pain limiting ROM. Mild strength deficits noted in L vs R shoulder with greatest weakness in abduction. POC will focus on improving postural awareness including increasing muscle flexibility along with soft tissue pliability, scapular strengthening/stabilization and R shoulder strengthening, with manual therapy and modalities PRN for pain and normalization of muscle tension.   Rehab Potential Good   Clinical Impairments Affecting Rehab Potential cervical fusion x 2 - C4-5 & C3-7   PT Frequency 2x / week   PT Duration 6 weeks   PT Treatment/Interventions Patient/family education;Neuromuscular re-education;Therapeutic exercise;Therapeutic activities;Manual techniques;Taping;Dry needling;Electrical Stimulation;Moist Heat;Ultrasound;Cryotherapy;Vasopneumatic Device;Iontophoresis 4mg /ml Dexamethasone;ADLs/Self Care Home Management   PT Next Visit Plan Review initial HEP; Postural training for neutral shoulder alignment - anterior stretching & scapular strengthening/stabilization; Progress to RTC as pt able to demonstrate good glenohumeral motion; Manual therapy & modalities PRN   Consulted and Agree with Plan of Care Patient      Patient will benefit from skilled therapeutic intervention in order to improve the following deficits and impairments:  Pain, Decreased range of motion, Postural dysfunction, Improper body mechanics, Impaired UE functional use, Increased muscle spasms  Visit Diagnosis: Left shoulder pain, unspecified chronicity  Stiffness of left shoulder, not elsewhere classified  Abnormal posture     Problem List Patient Active Problem List   Diagnosis Date Noted  . Low bone mass 07/09/2016  . Vitamin D deficiency 07/09/2016  . Spinal stenosis  of cervical region 05/03/2014  . ADHD (attention deficit hyperactivity disorder) 12/22/2012  .  Acne 12/22/2012    Percival Spanish, PT, MPT 01/24/2017, 6:51 PM  [facility] [address]  [place], [place], [address] Phone: [phone]   Fax:  [phone]  Name: Tiffany Parker MRN: [id number] Date of Birth: 02-26-1966

## 2017-01-30 ENCOUNTER — Ambulatory Visit: Payer: 59 | Attending: Family Medicine | Admitting: Physical Therapy

## 2017-01-30 DIAGNOSIS — M25512 Pain in left shoulder: Secondary | ICD-10-CM | POA: Diagnosis not present

## 2017-01-30 DIAGNOSIS — R293 Abnormal posture: Secondary | ICD-10-CM | POA: Diagnosis not present

## 2017-01-30 DIAGNOSIS — M25612 Stiffness of left shoulder, not elsewhere classified: Secondary | ICD-10-CM | POA: Insufficient documentation

## 2017-01-30 NOTE — Therapy (Signed)
[facility] [address]  [place], [place], [address] Phone: [phone]   Fax:  [phone]  Physical Therapy Treatment  Patient Details  Name: Tiffany Parker MRN: [id number] Date of Birth: 1966-06-07 Referring Provider: Lamar Blinks, MD  Encounter Date: 01/30/2017      PT End of Session - 01/30/17 0802    Visit Number 2   Number of Visits 12   Date for PT Re-Evaluation 03/08/17   PT Start Time 0802   PT Stop Time 0848   PT Time Calculation (min) 46 min   Activity Tolerance Patient tolerated treatment well   Behavior During Therapy [facility] for tasks assessed/performed      Past Medical History:  Diagnosis Date  . ADHD (attention deficit hyperactivity disorder)   . Allergy   . Arthritis   . Blood transfusion    post delivery  . Blood transfusion without reported diagnosis   . DVT (deep venous thrombosis) ([place]) 04/2012   following spine surgery - right leg  . Headache(784.0)   . PONV (postoperative nausea and vomiting)   . TMJ (dislocation of temporomandibular joint)     Past Surgical History:  Procedure Laterality Date  . ANTERIOR CERVICAL DECOMP/DISCECTOMY FUSION  05/05/2012   Procedure: ANTERIOR CERVICAL DECOMPRESSION/DISCECTOMY FUSION 1 LEVEL;  Surgeon: Elaina Hoops, MD;  Location: [place] [facility];  Service: Neurosurgery;  Laterality: N/A;  Cervical four-five anterior cervical decompression with fusion interbody prothesis plating and bonegraft  . ANTERIOR CERVICAL DECOMP/DISCECTOMY FUSION N/A 05/03/2014   Procedure: CERVICAL FIVE TO SIX, CERVICAL SIX TO SEVEN ANTERIOR CERVICAL DECOMPRESSION/DISCECTOMY FUSION 2 LEVELS;  Surgeon: Elaina Hoops, MD;  Location: [place] [facility];  Service: Neurosurgery;  Laterality: N/A;  . CESAREAN SECTION    . CHOLECYSTECTOMY    . episiotomy   11/19/97  . episiotomy repair  11/1997  . EYE SURGERY      There were no vitals filed for this visit.      Subjective Assessment -  01/30/17 0806    Subjective Pt noting she still wakes up with pain if she sleeps on her L side, which takes a while to go away.   Pertinent History 2 cervical spinal fusions - most recent C3-7   Patient Stated Goals "To be able to use my arm with no pain & avoid surgery."   Currently in Pain? No/denies                         [facility] Adult PT Treatment/Exercise - 01/30/17 0802      Shoulder Exercises: Supine   Horizontal ABduction Both;10 reps;Theraband   Theraband Level (Shoulder Horizontal ABduction) Level 1 (Yellow)   Horizontal ABduction Limitations hooklying on pool noodle   External Rotation AAROM;Left;10 reps  5" hold   External Rotation Limitations with wand, hooklying on pool noodle; cues shoudler/scapular depression   Flexion AAROM;Left;10 reps   Flexion Limitations with wand, hooklying on pool noodle; cues for shoudler/scapular depression     Shoulder Exercises: Standing   Retraction Both;10 reps   Retraction Limitations against pool noodle on wall     Shoulder Exercises: ROM/Strengthening   UBE (Upper Arm Bike) lvl 2.0 fwd/back x 3' each     Shoulder Exercises: Stretch   Corner Stretch 30 seconds;1 rep   Corner Stretch Limitations 3 way doorway stretch   Other Shoulder Stretches Hooklying pec stretch over pool noodle x60"     Modalities  Modalities Iontophoresis     Iontophoresis   Type of Iontophoresis Dexamethasone   Location L anterior shoulder   Dose 1.0 ml, 80 mA-min   Time 4-6 hr patch     Manual Therapy   Manual Therapy Soft tissue mobilization;Myofascial release   Manual therapy comments pt supine   Soft tissue mobilization B UT, LS, pecs; R anterior deltoid & RTC musculature   Myofascial Release R subscapularis & teres minor/major     Neck Exercises: Stretches   Upper Trapezius Stretch 30 seconds;2 reps   Upper Trapezius Stretch Limitations 1st rep manual by PT; 2nd rep self-stretch   Levator Stretch 30 seconds;2 reps   Levator  Stretch Limitations 1st rep manual by PT; 2nd rep self-stretch                PT Education - 01/30/17 0848    Education provided Yes   Education Details Iontophoresis precautions & contraindications   Person(s) Educated Patient   Methods Explanation;Handout   Comprehension Verbalized understanding          PT Short Term Goals - 01/30/17 0808      PT SHORT TERM GOAL #1   Title Independent with initial HEP by 02/08/17   Status On-going           PT Long Term Goals - 01/30/17 0808      PT LONG TERM GOAL #1   Title Independent with advanced HEP +/- gym program as indicated by 03/08/17   Status On-going     PT LONG TERM GOAL #2   Title L shoulder AROM within 5-10 dg of R w/o pain by 03/08/17   Status On-going     PT LONG TERM GOAL #3   Title L shoulder MMT >/= 4+/5 w/o pain by 03/08/17   Status On-going     PT LONG TERM GOAL #4   Title Pt will report ablility to perform ADL's including dressing/undressing and fastening bra w/o limitation due to L shoulder pain or LOM by 03/08/17   Status On-going     PT LONG TERM GOAL #5   Title Pt will report normal completion of household chores and job tasks including overhead reach/lift w/o limitation due to L shoulder pain or LOM by 03/08/17   Status On-going               Plan - 01/30/17 7106    Clinical Impression Statement Pt reporting no issues or concerns with HEP and able to demonstrate all exercises appropriately. Increased muscle tension noted throughout shoulder region, therefore considerable time spent on STM & MFR. AAROM attempted with cane with pt noting improved tolerance for ROM when cued for shoulder/scapular depression. Anterior shoudler particularly ttp during manual therapy, therefore initiated trial of ionto patch & will assess response at next visit.   Rehab Potential Good   Clinical Impairments Affecting Rehab Potential cervical fusion x 2 - C4-5 & C3-7   PT Treatment/Interventions Patient/family  education;Neuromuscular re-education;Therapeutic exercise;Therapeutic activities;Manual techniques;Taping;Dry needling;Electrical Stimulation;Moist Heat;Ultrasound;Cryotherapy;Vasopneumatic Device;Iontophoresis 4mg /ml Dexamethasone;ADLs/Self Care Home Management   PT Next Visit Plan Assess response to ionto patch; Postural training for neutral shoulder alignment - anterior stretching & scapular strengthening/stabilization; Progress to RTC as pt able to demonstrate good glenohumeral motion; Manual therapy & modalities PRN   Consulted and Agree with Plan of Care Patient      Patient will benefit from skilled therapeutic intervention in order to improve the following deficits and impairments:  Pain, Decreased range of motion, Postural dysfunction,  Improper body mechanics, Impaired UE functional use, Increased muscle spasms  Visit Diagnosis: Left shoulder pain, unspecified chronicity  Stiffness of left shoulder, not elsewhere classified  Abnormal posture     Problem List Patient Active Problem List   Diagnosis Date Noted  . Low bone mass 07/09/2016  . Vitamin D deficiency 07/09/2016  . Spinal stenosis of cervical region 05/03/2014  . ADHD (attention deficit hyperactivity disorder) 12/22/2012  . Acne 12/22/2012    Percival Spanish, PT, MPT 01/30/2017, 9:15 AM  [facility] [address]  [place], [place], [address] Phone: [phone]   Fax:  [phone]  Name: Tiffany Parker MRN: [id number] Date of Birth: 12-Sep-1966

## 2017-01-30 NOTE — Patient Instructions (Signed)

## 2017-02-01 ENCOUNTER — Ambulatory Visit: Payer: 59

## 2017-02-01 DIAGNOSIS — R293 Abnormal posture: Secondary | ICD-10-CM | POA: Diagnosis not present

## 2017-02-01 DIAGNOSIS — M25612 Stiffness of left shoulder, not elsewhere classified: Secondary | ICD-10-CM | POA: Diagnosis not present

## 2017-02-01 DIAGNOSIS — M25512 Pain in left shoulder: Secondary | ICD-10-CM

## 2017-02-01 NOTE — Therapy (Signed)
[facility] [address]  [place], [place], [address] Phone: [phone]   Fax:  [phone]  Physical Therapy Treatment  Patient Details  Name: Tiffany Parker MRN: [id number] Date of Birth: [birth year] Referring Provider: Lamar Blinks, MD  Encounter Date: 02/01/2017      PT End of Session - 02/01/17 0841    Visit Number 3   Number of Visits 12   Date for PT Re-Evaluation 03/08/17   PT Start Time 0842   PT Stop Time 0945   PT Time Calculation (min) 63 min   Activity Tolerance Patient tolerated treatment well   Behavior During Therapy [facility] for tasks assessed/performed      Past Medical History:  Diagnosis Date  . ADHD (attention deficit hyperactivity disorder)   . Allergy   . Arthritis   . Blood transfusion    post delivery  . Blood transfusion without reported diagnosis   . DVT (deep venous thrombosis) ([place]) 04/2012   following spine surgery - right leg  . Headache(784.0)   . PONV (postoperative nausea and vomiting)   . TMJ (dislocation of temporomandibular joint)     Past Surgical History:  Procedure Laterality Date  . ANTERIOR CERVICAL DECOMP/DISCECTOMY FUSION  05/05/2012   Procedure: ANTERIOR CERVICAL DECOMPRESSION/DISCECTOMY FUSION 1 LEVEL;  Surgeon: Elaina Hoops, MD;  Location: [place] [facility];  Service: Neurosurgery;  Laterality: N/A;  Cervical four-five anterior cervical decompression with fusion interbody prothesis plating and bonegraft  . ANTERIOR CERVICAL DECOMP/DISCECTOMY FUSION N/A 05/03/2014   Procedure: CERVICAL FIVE TO SIX, CERVICAL SIX TO SEVEN ANTERIOR CERVICAL DECOMPRESSION/DISCECTOMY FUSION 2 LEVELS;  Surgeon: Elaina Hoops, MD;  Location: [place] [facility];  Service: Neurosurgery;  Laterality: N/A;  . CESAREAN SECTION    . CHOLECYSTECTOMY    . episiotomy   11/19/97  . episiotomy repair  11/1997  . EYE SURGERY      There were no vitals filed for this visit.      Subjective Assessment -  02/01/17 0841    Subjective Pt. reporting she is currently waking up through night when on L side.  Pt. feels she has had worse pain today with reach activities.     Patient Stated Goals "To be able to use my arm with no pain & avoid surgery."   Currently in Pain? No/denies   Pain Score 0-No pain   Multiple Pain Sites No                         [facility] Adult PT Treatment/Exercise - 02/01/17 0851      Shoulder Exercises: Supine   Horizontal ABduction Both;10 reps;Theraband   Theraband Level (Shoulder Horizontal ABduction) Level 2 (Red)   Horizontal ABduction Limitations hooklying on 1/2 foam bolster    External Rotation AAROM;Left  attemped with yellow TB in B ER however unable due to pain   External Rotation Limitations with wand, hooklying on pool noodle; cues shoudler/scapular depression  required pillow support under elbow; still mild pain    Flexion --     Shoulder Exercises: ROM/Strengthening   UBE (Upper Arm Bike) lvl 2.5 fwd/back x 3' each     Shoulder Exercises: IT sales professional 30 seconds;1 rep   Corner Stretch Limitations 3 way doorway stretch   Other Shoulder Stretches Hooklying pec stretch over pool noodle x 60"     Modalities   Modalities Moist Heat     Moist Heat  Therapy   Number Minutes Moist Heat 15 Minutes   Moist Heat Location Shoulder  L scapular      Electrical Stimulation   Electrical Stimulation Location L UT, L anterior shoulder    Electrical Stimulation Action IFC   Electrical Stimulation Parameters 80-150Hz , intensity to pt. tolerance, 15'    Electrical Stimulation Goals Tone     Iontophoresis   Type of Iontophoresis Dexamethasone   Location L posterior shoulder  limited relief from anterior shoulder last patch   Dose 1.0 ml, 80 mA-min   Time 4-6 hr patch     Manual Therapy   Manual Therapy Soft tissue mobilization;Myofascial release;Scapular mobilization   Manual therapy comments pt supine   Soft tissue mobilization  B UT, LS, pecs; R anterior deltoid & RTC musculature   Myofascial Release R subscapularis & teres minor/major   Scapular Mobilization L scapula medial glide stretch                   PT Short Term Goals - 01/30/17 3810      PT SHORT TERM GOAL #1   Title Independent with initial HEP by 02/08/17   Status On-going           PT Long Term Goals - 01/30/17 0808      PT LONG TERM GOAL #1   Title Independent with advanced HEP +/- gym program as indicated by 03/08/17   Status On-going     PT LONG TERM GOAL #2   Title L shoulder AROM within 5-10 dg of R w/o pain by 03/08/17   Status On-going     PT LONG TERM GOAL #3   Title L shoulder MMT >/= 4+/5 w/o pain by 03/08/17   Status On-going     PT LONG TERM GOAL #4   Title Pt will report ablility to perform ADL's including dressing/undressing and fastening bra w/o limitation due to L shoulder pain or LOM by 03/08/17   Status On-going     PT LONG TERM GOAL #5   Title Pt will report normal completion of household chores and job tasks including overhead reach/lift w/o limitation due to L shoulder pain or LOM by 03/08/17   Status On-going               Plan - 02/01/17 0847    Clinical Impression Statement Pt. reporting worse pain today with reaching activities and with poor tolerance for supine level AROM and AAROM therex.  Pt. unable to perform gravity assisted supine AAROM wand ER without significant pain rise today.  Treatment focusing on gentle scapular strengthening and manual STM/TPR to shoulder musculature to promote relaxation.  Noting limited relief from ionto patch applied to anterior shoulder last treatment.  Ionto patch #2/6 applied to posterior deltoid in area of greatest pain today.  Moist heat/E-stim combo to L shoulder to reduce muscular tension to end treatment.  Pt. with pain reports consistent with onset of frozen shoulder.  Pain with passive abduction, ER motions today.  Will monitor pt. tolerance to in coming  visits.     PT Treatment/Interventions Patient/family education;Neuromuscular re-education;Therapeutic exercise;Therapeutic activities;Manual techniques;Taping;Dry needling;Electrical Stimulation;Moist Heat;Ultrasound;Cryotherapy;Vasopneumatic Device;Iontophoresis 4mg /ml Dexamethasone;ADLs/Self Care Home Management   PT Next Visit Plan Assess response to ionto patch; Postural training for neutral shoulder alignment - anterior stretching & scapular strengthening/stabilization; Progress to RTC as pt able to demonstrate good glenohumeral motion; Manual therapy & modalities PRN      Patient will benefit from skilled therapeutic intervention in order to  improve the following deficits and impairments:  Pain, Decreased range of motion, Postural dysfunction, Improper body mechanics, Impaired UE functional use, Increased muscle spasms  Visit Diagnosis: Left shoulder pain, unspecified chronicity  Stiffness of left shoulder, not elsewhere classified  Abnormal posture     Problem List Patient Active Problem List   Diagnosis Date Noted  . Low bone mass 07/09/2016  . Vitamin D deficiency 07/09/2016  . Spinal stenosis of cervical region 05/03/2014  . ADHD (attention deficit hyperactivity disorder) 12/22/2012  . Acne 12/22/2012    Bess Harvest, PTA 02/01/17 12:51 PM   [facility] [address]  [place], [place], [address] Phone: [phone]   Fax:  [phone]  Name: CORY RAMA MRN: [id number] Date of Birth: 1966/08/18

## 2017-02-04 ENCOUNTER — Ambulatory Visit: Payer: 59 | Admitting: Physical Therapy

## 2017-02-04 DIAGNOSIS — M25512 Pain in left shoulder: Secondary | ICD-10-CM | POA: Diagnosis not present

## 2017-02-04 DIAGNOSIS — R293 Abnormal posture: Secondary | ICD-10-CM

## 2017-02-04 DIAGNOSIS — M25612 Stiffness of left shoulder, not elsewhere classified: Secondary | ICD-10-CM | POA: Diagnosis not present

## 2017-02-04 NOTE — Therapy (Signed)
[facility] [address]  [place], [place], [address] Phone: [phone]   Fax:  [phone]  Physical Therapy Treatment  Patient Details  Name: Tiffany Parker MRN: [id number] Date of Birth: 08-26-1966 Referring Provider: Lamar Blinks, MD  Encounter Date: 02/04/2017      PT End of Session - 02/04/17 0844    Visit Number 4   Number of Visits 12   Date for PT Re-Evaluation 03/08/17   PT Start Time 0844   PT Stop Time 0947   PT Time Calculation (min) 63 min   Activity Tolerance Patient tolerated treatment well   Behavior During Therapy [facility] for tasks assessed/performed      Past Medical History:  Diagnosis Date  . ADHD (attention deficit hyperactivity disorder)   . Allergy   . Arthritis   . Blood transfusion    post delivery  . Blood transfusion without reported diagnosis   . DVT (deep venous thrombosis) ([place]) 04/2012   following spine surgery - right leg  . Headache(784.0)   . PONV (postoperative nausea and vomiting)   . TMJ (dislocation of temporomandibular joint)     Past Surgical History:  Procedure Laterality Date  . ANTERIOR CERVICAL DECOMP/DISCECTOMY FUSION  05/05/2012   Procedure: ANTERIOR CERVICAL DECOMPRESSION/DISCECTOMY FUSION 1 LEVEL;  Surgeon: Elaina Hoops, MD;  Location: [place] [facility];  Service: Neurosurgery;  Laterality: N/A;  Cervical four-five anterior cervical decompression with fusion interbody prothesis plating and bonegraft  . ANTERIOR CERVICAL DECOMP/DISCECTOMY FUSION N/A 05/03/2014   Procedure: CERVICAL FIVE TO SIX, CERVICAL SIX TO SEVEN ANTERIOR CERVICAL DECOMPRESSION/DISCECTOMY FUSION 2 LEVELS;  Surgeon: Elaina Hoops, MD;  Location: [place] [facility];  Service: Neurosurgery;  Laterality: N/A;  . CESAREAN SECTION    . CHOLECYSTECTOMY    . episiotomy   11/19/97  . episiotomy repair  11/1997  . EYE SURGERY      There were no vitals filed for this visit.      Subjective Assessment -  02/04/17 0847    Subjective Pt reports it still feels like if she tries to push certain motions, that her shoulder would break. Pain only present with these movements but up to 7/10 in intensity.   Pertinent History 2 cervical spinal fusions - most recent C3-7   Patient Stated Goals "To be able to use my arm with no pain & avoid surgery."   Currently in Pain? Yes   Pain Score 0-No pain  up to 2/54 with certain movements   Pain Location Shoulder   Pain Orientation Left                         [facility] Adult PT Treatment/Exercise - 02/04/17 0844      Shoulder Exercises: Standing   Other Standing Exercises L scapular retraction & depression with red TB 15x3"     Shoulder Exercises: ROM/Strengthening   UBE (Upper Arm Bike) lvl 2.5 fwd/back x 3' each   Other ROM/Strengthening Exercises BATCA pull-down 10# 15x3" - emphasis on scapular retraction & depression with AAROM L shoudler flexion upon release     Modalities   Modalities Electrical Stimulation;Moist Heat     Moist Heat Therapy   Number Minutes Moist Heat 15 Minutes   Moist Heat Location Shoulder  L scapular      Electrical Stimulation   Electrical Stimulation Location L ant/post shoulder & scapular area   Electrical Stimulation Action IFC  Electrical Stimulation Parameters 80-150Hz , intensity to pt. tolerance, 15'    Electrical Stimulation Goals Pain;Tone     Manual Therapy   Manual Therapy Soft tissue mobilization;Myofascial release;Scapular mobilization;Passive ROM   Manual therapy comments Pt supine & R sidelying   Soft tissue mobilization L subscap, teres major/minor, rhomboids & LS   Myofascial Release R subscapularis & teres minor/major   Scapular Mobilization L scapula - all directions   Passive ROM L shoudler MWM flexion & ER                PT Education - 02/04/17 0952    Education provided Yes   Education Details posterior capsule stretch   Person(s) Educated Patient   Methods  Explanation;Demonstration;Handout   Comprehension Verbalized understanding;Need further instruction          PT Short Term Goals - 01/30/17 0808      PT SHORT TERM GOAL #1   Title Independent with initial HEP by 02/08/17   Status On-going           PT Long Term Goals - 01/30/17 0808      PT LONG TERM GOAL #1   Title Independent with advanced HEP +/- gym program as indicated by 03/08/17   Status On-going     PT LONG TERM GOAL #2   Title L shoulder AROM within 5-10 dg of R w/o pain by 03/08/17   Status On-going     PT LONG TERM GOAL #3   Title L shoulder MMT >/= 4+/5 w/o pain by 03/08/17   Status On-going     PT LONG TERM GOAL #4   Title Pt will report ablility to perform ADL's including dressing/undressing and fastening bra w/o limitation due to L shoulder pain or LOM by 03/08/17   Status On-going     PT LONG TERM GOAL #5   Title Pt will report normal completion of household chores and job tasks including overhead reach/lift w/o limitation due to L shoulder pain or LOM by 03/08/17   Status On-going               Plan - 02/04/17 0850    Clinical Impression Statement Pt reporting no pain at rest but continued pain up to 6/30 with certain motions. Palpation of L shoulder musculature revealed excessively tight inferior/posterior capsule with mutiple TPs. STM & MFR improved muscle tension with pt able to achieve full ER PROM and improving flexion & abduction PROM after manual therapy. Treatment concluded with estim & moist heat to promote furhter muscle relaxation. Will continue therapy focus on normalizing muscle tone and improving flexibility with gradual incorporation of gentle strengthening as pain allows.   Rehab Potential Good   Clinical Impairments Affecting Rehab Potential cervical fusion x 2 - C4-5 & C3-7   PT Treatment/Interventions Patient/family education;Neuromuscular re-education;Therapeutic exercise;Therapeutic activities;Manual techniques;Taping;Dry  needling;Electrical Stimulation;Moist Heat;Ultrasound;Cryotherapy;Vasopneumatic Device;Iontophoresis 4mg /ml Dexamethasone;ADLs/Self Care Home Management   PT Next Visit Plan Postural training for neutral shoulder alignment - anterior stretching & scapular strengthening/stabilization; Manual therapy to address muscle tightness & capsular restriction; Progress to RTC as pt able to demonstrate good glenohumeral motion; Modalities PRN   Consulted and Agree with Plan of Care Patient      Patient will benefit from skilled therapeutic intervention in order to improve the following deficits and impairments:  Pain, Decreased range of motion, Postural dysfunction, Improper body mechanics, Impaired UE functional use, Increased muscle spasms  Visit Diagnosis: Left shoulder pain, unspecified chronicity  Stiffness of left shoulder, not elsewhere  classified  Abnormal posture     Problem List Patient Active Problem List   Diagnosis Date Noted  . Low bone mass 07/09/2016  . Vitamin D deficiency 07/09/2016  . Spinal stenosis of cervical region 05/03/2014  . ADHD (attention deficit hyperactivity disorder) 12/22/2012  . Acne 12/22/2012    Percival Spanish, PT, MPT 02/04/2017, 9:53 AM  [facility] [address]  [place], [place], [address] Phone: [phone]   Fax:  [phone]  Name: Tiffany Parker MRN: [id number] Date of Birth: [birth year]

## 2017-02-07 ENCOUNTER — Ambulatory Visit: Payer: 59

## 2017-02-07 DIAGNOSIS — M25512 Pain in left shoulder: Secondary | ICD-10-CM

## 2017-02-07 DIAGNOSIS — M25612 Stiffness of left shoulder, not elsewhere classified: Secondary | ICD-10-CM | POA: Diagnosis not present

## 2017-02-07 DIAGNOSIS — R293 Abnormal posture: Secondary | ICD-10-CM | POA: Diagnosis not present

## 2017-02-07 NOTE — Therapy (Signed)
[facility] [address]  [place], [place], [address] Phone: [phone]   Fax:  [phone]  Physical Therapy Treatment  Patient Details  Name: Tiffany Parker MRN: [id number] Date of Birth: 11/10/1965 Referring Provider: Lamar Blinks, MD  Encounter Date: 02/07/2017      PT End of Session - 02/07/17 0846    Visit Number 5   Number of Visits 12   Date for PT Re-Evaluation 03/08/17   PT Start Time 0843   PT Stop Time 0947   PT Time Calculation (min) 64 min   Activity Tolerance Patient tolerated treatment well   Behavior During Therapy [facility] for tasks assessed/performed      Past Medical History:  Diagnosis Date  . ADHD (attention deficit hyperactivity disorder)   . Allergy   . Arthritis   . Blood transfusion    post delivery  . Blood transfusion without reported diagnosis   . DVT (deep venous thrombosis) ([place]) 04/2012   following spine surgery - right leg  . Headache(784.0)   . PONV (postoperative nausea and vomiting)   . TMJ (dislocation of temporomandibular joint)     Past Surgical History:  Procedure Laterality Date  . ANTERIOR CERVICAL DECOMP/DISCECTOMY FUSION  05/05/2012   Procedure: ANTERIOR CERVICAL DECOMPRESSION/DISCECTOMY FUSION 1 LEVEL;  Surgeon: Elaina Hoops, MD;  Location: [place] [facility];  Service: Neurosurgery;  Laterality: N/A;  Cervical four-five anterior cervical decompression with fusion interbody prothesis plating and bonegraft  . ANTERIOR CERVICAL DECOMP/DISCECTOMY FUSION N/A 05/03/2014   Procedure: CERVICAL FIVE TO SIX, CERVICAL SIX TO SEVEN ANTERIOR CERVICAL DECOMPRESSION/DISCECTOMY FUSION 2 LEVELS;  Surgeon: Elaina Hoops, MD;  Location: [place] [facility];  Service: Neurosurgery;  Laterality: N/A;  . CESAREAN SECTION    . CHOLECYSTECTOMY    . episiotomy   11/19/97  . episiotomy repair  11/1997  . EYE SURGERY      There were no vitals filed for this visit.      Subjective Assessment -  02/07/17 0843    Subjective Pt. reporting she had, "a few days relief" following last treatment.  Pt. reporting shoulder felt like it, "tightened back up" yesterday.  Pain still up to 1/70 with certain movements.     Patient Stated Goals "To be able to use my arm with no pain & avoid surgery."   Currently in Pain? No/denies   Pain Score 0-No pain  0/17 certain movements   Pain Location Shoulder   Pain Orientation Left   Pain Descriptors / Indicators Aching;Sore   Pain Type Acute pain   Pain Radiating Towards mid uppdre arm   Pain Frequency Intermittent   Aggravating Factors  reaching back, reaching up, reaching overhead and back   Pain Relieving Factors rest    Multiple Pain Sites No                         [facility] Adult PT Treatment/Exercise - 02/07/17 0920      Shoulder Exercises: Supine   External Rotation AAROM;Both   External Rotation Limitations with wand, hooklying 1/2 foam bolster; cues shoudler/scapular depression  1#    Flexion AAROM;Left;10 reps   Flexion Limitations with wand, hooklying on 1/2 foam bolster; cues for shoudler/scapular depression     Moist Heat Therapy   Number Minutes Moist Heat 15 Minutes   Moist Heat Location Shoulder  L shoulder and up back      Electrical Stimulation  Electrical Stimulation Location L ant/post shoulder & scapular area   Electrical Stimulation Action IFC   Electrical Stimulation Parameters 80-150Hz , intensity to pt. tolerance, 15 min    Electrical Stimulation Goals Pain;Tone     Manual Therapy   Manual Therapy Soft tissue mobilization;Myofascial release;Scapular mobilization;Passive ROM   Manual therapy comments Pt supine & R sidelying   Soft tissue mobilization L subscap, teres major/minor, rhomboids & LS   Myofascial Release R subscapularis & teres minor/major   Scapular Mobilization L scapula - all directions   Passive ROM L shoulder PROM into ER, flexion, scaption with traction and scapular guidance      Neck Exercises: Stretches   Corner Stretch 2 reps;30 seconds  no pain with mid; doorseal                   PT Short Term Goals - 02/07/17 1049      PT SHORT TERM GOAL #1   Title Independent with initial HEP by 02/08/17   Status Achieved           PT Long Term Goals - 01/30/17 0808      PT LONG TERM GOAL #1   Title Independent with advanced HEP +/- gym program as indicated by 03/08/17   Status On-going     PT LONG TERM GOAL #2   Title L shoulder AROM within 5-10 dg of R w/o pain by 03/08/17   Status On-going     PT LONG TERM GOAL #3   Title L shoulder MMT >/= 4+/5 w/o pain by 03/08/17   Status On-going     PT LONG TERM GOAL #4   Title Pt will report ablility to perform ADL's including dressing/undressing and fastening bra w/o limitation due to L shoulder pain or LOM by 03/08/17   Status On-going     PT LONG TERM GOAL #5   Title Pt will report normal completion of household chores and job tasks including overhead reach/lift w/o limitation due to L shoulder pain or LOM by 03/08/17   Status On-going               Plan - 02/07/17 0925    Clinical Impression Statement Pt. noting two day relief from last treatment with noticeably improved motion.  Pt. reporting L shoulder "tightened back up" yesterday.  Treatment focusing on STM/manual stretching to L shoulder musculature and significant time spent on scapular mobility.  Pt. achieving much improved ER and flexion motions following manual work to L shoulder with decreased pain at end range.  Moist heat/E-stim used to end treatment to promote muscular relaxation and decrease post-mobility pain.  Will plan to monitor pt. ability to perform HEP and response to manual work in coming visits.   PT Treatment/Interventions Patient/family education;Neuromuscular re-education;Therapeutic exercise;Therapeutic activities;Manual techniques;Taping;Dry needling;Electrical Stimulation;Moist Heat;Ultrasound;Cryotherapy;Vasopneumatic  Device;Iontophoresis 4mg /ml Dexamethasone;ADLs/Self Care Home Management   PT Next Visit Plan Postural training for neutral shoulder alignment - anterior stretching & scapular strengthening/stabilization; Manual therapy to address muscle tightness & capsular restriction; Progress to RTC as pt able to demonstrate good glenohumeral motion; Modalities PRN      Patient will benefit from skilled therapeutic intervention in order to improve the following deficits and impairments:  Pain, Decreased range of motion, Postural dysfunction, Improper body mechanics, Impaired UE functional use, Increased muscle spasms  Visit Diagnosis: Left shoulder pain, unspecified chronicity  Stiffness of left shoulder, not elsewhere classified  Abnormal posture     Problem List Patient Active Problem List   Diagnosis  Date Noted  . Low bone mass 07/09/2016  . Vitamin D deficiency 07/09/2016  . Spinal stenosis of cervical region 05/03/2014  . ADHD (attention deficit hyperactivity disorder) 12/22/2012  . Acne 12/22/2012    Bess Harvest, PTA 02/07/17 10:57 AM   [facility] [address]  [place], [place], [address] Phone: [phone]   Fax:  [phone]  Name: Tiffany Parker MRN: [id number] Date of Birth: 1965/11/12

## 2017-02-11 ENCOUNTER — Ambulatory Visit: Payer: 59 | Admitting: Physical Therapy

## 2017-02-11 DIAGNOSIS — R293 Abnormal posture: Secondary | ICD-10-CM | POA: Diagnosis not present

## 2017-02-11 DIAGNOSIS — M25512 Pain in left shoulder: Secondary | ICD-10-CM

## 2017-02-11 DIAGNOSIS — M25612 Stiffness of left shoulder, not elsewhere classified: Secondary | ICD-10-CM

## 2017-02-11 NOTE — Therapy (Signed)
[facility] [address]  [place], [place], [address] Phone: [phone]   Fax:  [phone]  Physical Therapy Treatment  Patient Details  Name: Tiffany Parker MRN: [id number] Date of Birth: [birth year] Referring Provider: Lamar Blinks, MD  Encounter Date: 02/11/2017      PT End of Session - 02/11/17 0848    Visit Number 6   Number of Visits 12   Date for PT Re-Evaluation 03/08/17   PT Start Time 0848   PT Stop Time 0952   PT Time Calculation (min) 64 min   Activity Tolerance Patient tolerated treatment well   Behavior During Therapy [facility] for tasks assessed/performed      Past Medical History:  Diagnosis Date  . ADHD (attention deficit hyperactivity disorder)   . Allergy   . Arthritis   . Blood transfusion    post delivery  . Blood transfusion without reported diagnosis   . DVT (deep venous thrombosis) ([place]) 04/2012   following spine surgery - right leg  . Headache(784.0)   . PONV (postoperative nausea and vomiting)   . TMJ (dislocation of temporomandibular joint)     Past Surgical History:  Procedure Laterality Date  . ANTERIOR CERVICAL DECOMP/DISCECTOMY FUSION  05/05/2012   Procedure: ANTERIOR CERVICAL DECOMPRESSION/DISCECTOMY FUSION 1 LEVEL;  Surgeon: Elaina Hoops, MD;  Location: [place] [facility];  Service: Neurosurgery;  Laterality: N/A;  Cervical four-five anterior cervical decompression with fusion interbody prothesis plating and bonegraft  . ANTERIOR CERVICAL DECOMP/DISCECTOMY FUSION N/A 05/03/2014   Procedure: CERVICAL FIVE TO SIX, CERVICAL SIX TO SEVEN ANTERIOR CERVICAL DECOMPRESSION/DISCECTOMY FUSION 2 LEVELS;  Surgeon: Elaina Hoops, MD;  Location: [place] [facility];  Service: Neurosurgery;  Laterality: N/A;  . CESAREAN SECTION    . CHOLECYSTECTOMY    . episiotomy   11/19/97  . episiotomy repair  11/1997  . EYE SURGERY      There were no vitals filed for this visit.      Subjective Assessment -  02/11/17 0852    Subjective Pt reports her shoulder ached (4/10)  for a long time yesterday after waking up on her left side, but otherwise only has pain with extremes of motion.   Patient Stated Goals "To be able to use my arm with no pain & avoid surgery."   Currently in Pain? No/denies   Pain Score 0-No pain  up to 6/10 with extremes of motion   Pain Location Shoulder   Pain Orientation Left   Pain Descriptors / Indicators Aching   Pain Type Acute pain            [facility] PT Assessment - 02/11/17 0848      AROM   Right Shoulder Flexion 170 Degrees   Right Shoulder ABduction 177 Degrees   Left Shoulder Flexion 162 Degrees   Left Shoulder ABduction 146 Degrees   Left Shoulder Internal Rotation 94 Degrees   Left Shoulder External Rotation 93 Degrees     Strength   Right Shoulder Flexion 4+/5   Right Shoulder ABduction 4+/5   Right Shoulder Internal Rotation 4+/5   Right Shoulder External Rotation 4+/5   Left Shoulder Flexion 4/5   Left Shoulder ABduction 4-/5  pain with resistance   Left Shoulder Internal Rotation 4/5  pain with resistance   Left Shoulder External Rotation 4/5                     [facility] Adult PT Treatment/Exercise -  02/11/17 0848      Shoulder Exercises: Supine   Protraction Left;12 reps   Flexion AROM;10 reps   Flexion Limitations pain with full range flexion therefore pt cued to remain in painfree range     Shoulder Exercises: Sidelying   External Rotation AROM;Left;12 reps   External Rotation Limitations limited range   ABduction AROM;Left;12 reps   ABduction Limitations 20-90 dg     Shoulder Exercises: ROM/Strengthening   UBE (Upper Arm Bike) lvl 2.5 fwd/back x 3' each     Modalities   Modalities Electrical Stimulation;Moist Heat     Moist Heat Therapy   Moist Heat Location Shoulder  L shoulder and scapula     Electrical Stimulation   Electrical Stimulation Location L ant/post shoulder & scapular area   Electrical  Stimulation Action IFC   Electrical Stimulation Parameters 80-150Hz , intensity to pt. tolerance, 15'    Electrical Stimulation Goals Pain;Tone     Manual Therapy   Manual Therapy Soft tissue mobilization;Myofascial release;Scapular mobilization;Passive ROM   Manual therapy comments Pt supine & R sidelying   Soft tissue mobilization L subscap, teres major/minor, rhomboids & LS   Myofascial Release R subscapularis & teres minor/major   Scapular Mobilization L scapula - all directions   Passive ROM L shoudler MWM flexion & ER                  PT Short Term Goals - 02/07/17 1049      PT SHORT TERM GOAL #1   Title Independent with initial HEP by 02/08/17   Status Achieved           PT Long Term Goals - 01/30/17 0808      PT LONG TERM GOAL #1   Title Independent with advanced HEP +/- gym program as indicated by 03/08/17   Status On-going     PT LONG TERM GOAL #2   Title L shoulder AROM within 5-10 dg of R w/o pain by 03/08/17   Status On-going     PT LONG TERM GOAL #3   Title L shoulder MMT >/= 4+/5 w/o pain by 03/08/17   Status On-going     PT LONG TERM GOAL #4   Title Pt will report ablility to perform ADL's including dressing/undressing and fastening bra w/o limitation due to L shoulder pain or LOM by 03/08/17   Status On-going     PT LONG TERM GOAL #5   Title Pt will report normal completion of household chores and job tasks including overhead reach/lift w/o limitation due to L shoulder pain or LOM by 03/08/17   Status On-going               Plan - 02/11/17 0855    Clinical Impression Statement Pt demonstrating excellent progress with PT with significant improvements observed with L shoulder ROM - flexion and abduction increased by >/= 20 dg and IR and ER now >90 dg. Pt still reporting discomfort with end range of motion primarily in flexion and abduction at this point, but also with functional IR when reaching to fasten bra. L shoulder strength also  improved but pain noted with resistance in abduction and ER. Pt continues to demonstrate tightness in posterior/inferior shoulder as well as limited scapular activation which are likely contributing to continued limitations. Will increase emphasis on scapular strengthening/stabilization and progess RTC AROM/strengthening as tolerated.   Rehab Potential Good   Clinical Impairments Affecting Rehab Potential cervical fusion x 2 - C4-5 & C3-7  PT Treatment/Interventions Patient/family education;Neuromuscular re-education;Therapeutic exercise;Therapeutic activities;Manual techniques;Taping;Dry needling;Electrical Stimulation;Moist Heat;Ultrasound;Cryotherapy;Vasopneumatic Device;Iontophoresis 4mg /ml Dexamethasone;ADLs/Self Care Home Management   PT Next Visit Plan Postural training for neutral shoulder alignment - anterior stretching & scapular strengthening/stabilization; Manual therapy to address muscle tightness & capsular restriction; Progress to RTC as pt able to demonstrate good glenohumeral motion; Modalities PRN   Consulted and Agree with Plan of Care Patient      Patient will benefit from skilled therapeutic intervention in order to improve the following deficits and impairments:  Pain, Decreased range of motion, Postural dysfunction, Improper body mechanics, Impaired UE functional use, Increased muscle spasms  Visit Diagnosis: Left shoulder pain, unspecified chronicity  Stiffness of left shoulder, not elsewhere classified  Abnormal posture     Problem List Patient Active Problem List   Diagnosis Date Noted  . Low bone mass 07/09/2016  . Vitamin D deficiency 07/09/2016  . Spinal stenosis of cervical region 05/03/2014  . ADHD (attention deficit hyperactivity disorder) 12/22/2012  . Acne 12/22/2012    Percival Spanish, PT, MPT 02/11/2017, 10:47 AM  [facility] [address]  [place], [place], [address] Phone:  [phone]   Fax:  [phone]  Name: Tiffany Parker MRN: [id number] Date of Birth: 14-Oct-1966

## 2017-02-12 ENCOUNTER — Encounter: Payer: 59 | Admitting: Physical Therapy

## 2017-02-14 ENCOUNTER — Ambulatory Visit: Payer: 59

## 2017-02-14 DIAGNOSIS — R293 Abnormal posture: Secondary | ICD-10-CM

## 2017-02-14 DIAGNOSIS — M25612 Stiffness of left shoulder, not elsewhere classified: Secondary | ICD-10-CM

## 2017-02-14 DIAGNOSIS — M25512 Pain in left shoulder: Secondary | ICD-10-CM

## 2017-02-14 NOTE — Therapy (Signed)
[facility] [address]  [place], [place], [address] Phone: [phone]   Fax:  [phone]  Physical Therapy Treatment  Patient Details  Name: Tiffany Parker MRN: [id number] Date of Birth: [birth year] Referring Provider: Lamar Blinks, MD  Encounter Date: 02/14/2017      PT End of Session - 02/14/17 0848    Visit Number 7   Number of Visits 12   Date for PT Re-Evaluation 03/08/17   PT Start Time 0845   PT Stop Time 0945   PT Time Calculation (min) 60 min   Activity Tolerance Patient tolerated treatment well   Behavior During Therapy [facility] for tasks assessed/performed      Past Medical History:  Diagnosis Date  . ADHD (attention deficit hyperactivity disorder)   . Allergy   . Arthritis   . Blood transfusion    post delivery  . Blood transfusion without reported diagnosis   . DVT (deep venous thrombosis) ([place]) 04/2012   following spine surgery - right leg  . Headache(784.0)   . PONV (postoperative nausea and vomiting)   . TMJ (dislocation of temporomandibular joint)     Past Surgical History:  Procedure Laterality Date  . ANTERIOR CERVICAL DECOMP/DISCECTOMY FUSION  05/05/2012   Procedure: ANTERIOR CERVICAL DECOMPRESSION/DISCECTOMY FUSION 1 LEVEL;  Surgeon: Elaina Hoops, MD;  Location: [place] [facility];  Service: Neurosurgery;  Laterality: N/A;  Cervical four-five anterior cervical decompression with fusion interbody prothesis plating and bonegraft  . ANTERIOR CERVICAL DECOMP/DISCECTOMY FUSION N/A 05/03/2014   Procedure: CERVICAL FIVE TO SIX, CERVICAL SIX TO SEVEN ANTERIOR CERVICAL DECOMPRESSION/DISCECTOMY FUSION 2 LEVELS;  Surgeon: Elaina Hoops, MD;  Location: [place] [facility];  Service: Neurosurgery;  Laterality: N/A;  . CESAREAN SECTION    . CHOLECYSTECTOMY    . episiotomy   11/19/97  . episiotomy repair  11/1997  . EYE SURGERY      There were no vitals filed for this visit.      Subjective Assessment -  02/14/17 0847    Subjective Pt. reporting still 6/10 sharp pain with reaching movements.   Patient Stated Goals "To be able to use my arm with no pain & avoid surgery."   Currently in Pain? No/denies   Pain Score 0-No pain  still up to 6/10 with reaching movements    Multiple Pain Sites No                         [facility] Adult PT Treatment/Exercise - 02/14/17 0900      Shoulder Exercises: Supine   Horizontal ABduction Both;10 reps;Theraband   Theraband Level (Shoulder Horizontal ABduction) Level 1 (Yellow)   Horizontal ABduction Limitations hooklying on 1/2 foam bolster; slightly lower than 90 dg abduction plane    External Rotation Both;AROM   External Rotation Limitations Seated with therapist scapular glides    Other Supine Exercises B ER with yellow TB laying on 1/2 foam boslter   Other Supine Exercises B shoulder horizontal abduction with 5" scap. squeeze laying on 1/2 foam bolster      Shoulder Exercises: Standing   Extension AROM;15 reps   Theraband Level (Shoulder Extension) Level 2 (Red)   Extension Limitations Constant tactile cues for scap. squeeze    Row AROM;Strengthening;Both;15 reps;Theraband   Theraband Level (Shoulder Row) Level 2 (Red)   Row Limitations constant tactile cues for scap. squeeze      Shoulder Exercises: ROM/Strengthening   UBE (Upper Arm  Bike) lvl 2.5 fwd/back x 3' each     Shoulder Exercises: Stretch   Other Shoulder Stretches Hooklying pec stretch over pool noodle x 60"     Moist Heat Therapy   Number Minutes Moist Heat 15 Minutes   Moist Heat Location Shoulder  L shoulder and scapula     Electrical Stimulation   Electrical Stimulation Location L ant/post shoulder & scapular area   Electrical Stimulation Action IFC   Electrical Stimulation Parameters 80-150Hz , intensity to pt. tolerance, 15'    Electrical Stimulation Goals Pain;Tone     Manual Therapy   Manual Therapy Soft tissue mobilization;Myofascial release;Scapular  mobilization;Passive ROM   Manual therapy comments Pt supine & R sidelying   Soft tissue mobilization L subscap, teres major/minor, rhomboids & LS   Myofascial Release R & teres minor/major   Scapular Mobilization L scapula - all directions   Passive ROM L shoulder flexoin, Abduction ER                   PT Short Term Goals - 02/07/17 1049      PT SHORT TERM GOAL #1   Title Independent with initial HEP by 02/08/17   Status Achieved           PT Long Term Goals - 01/30/17 0808      PT LONG TERM GOAL #1   Title Independent with advanced HEP +/- gym program as indicated by 03/08/17   Status On-going     PT LONG TERM GOAL #2   Title L shoulder AROM within 5-10 dg of R w/o pain by 03/08/17   Status On-going     PT LONG TERM GOAL #3   Title L shoulder MMT >/= 4+/5 w/o pain by 03/08/17   Status On-going     PT LONG TERM GOAL #4   Title Pt will report ablility to perform ADL's including dressing/undressing and fastening bra w/o limitation due to L shoulder pain or LOM by 03/08/17   Status On-going     PT LONG TERM GOAL #5   Title Pt will report normal completion of household chores and job tasks including overhead reach/lift w/o limitation due to L shoulder pain or LOM by 03/08/17   Status On-going               Plan - 02/14/17 0850    Clinical Impression Statement Pt. reporting she still has pain up to 6/10 with reaching activities however is feeling good relief from posterior shoulder stretch HEP activity at home.  Treatment focusing on gentle scapular strengthening activity to pt. tolerance.  Pt. able to perform gentle ER, standing row, and extension row without significant pain.  Tactile cueing throughout therex to ensure full scapular motion.  Manual focusing on scapular mobility and posterior/inferior STM/TPR.  Continued relief reported following manual work.  E-stim/moist heat combo to end treatment decrease post-exercise pain and tone.     PT  Treatment/Interventions Patient/family education;Neuromuscular re-education;Therapeutic exercise;Therapeutic activities;Manual techniques;Taping;Dry needling;Electrical Stimulation;Moist Heat;Ultrasound;Cryotherapy;Vasopneumatic Device;Iontophoresis 4mg /ml Dexamethasone;ADLs/Self Care Home Management   PT Next Visit Plan Progress scapular strengthening per pt. tolerance; Postural training for neutral shoulder alignment - anterior stretching; Manual therapy to address muscle tightness & capsular restriction; Progress to RTC as pt able to demonstrate good glenohumeral motion; Modalities PRN      Patient will benefit from skilled therapeutic intervention in order to improve the following deficits and impairments:  Pain, Decreased range of motion, Postural dysfunction, Improper body mechanics, Impaired UE functional use, Increased  muscle spasms  Visit Diagnosis: Left shoulder pain, unspecified chronicity  Stiffness of left shoulder, not elsewhere classified  Abnormal posture     Problem List Patient Active Problem List   Diagnosis Date Noted  . Low bone mass 07/09/2016  . Vitamin D deficiency 07/09/2016  . Spinal stenosis of cervical region 05/03/2014  . ADHD (attention deficit hyperactivity disorder) 12/22/2012  . Acne 12/22/2012    Bess Harvest, PTA 02/14/17 10:55 AM  [facility] [address]  [place], [place], [address] Phone: [phone]   Fax:  [phone]  Name: AALYIAH CAMBEROS MRN: [id number] Date of Birth: May 04, 1966

## 2017-02-18 ENCOUNTER — Ambulatory Visit: Payer: 59

## 2017-02-18 DIAGNOSIS — R293 Abnormal posture: Secondary | ICD-10-CM

## 2017-02-18 DIAGNOSIS — M25612 Stiffness of left shoulder, not elsewhere classified: Secondary | ICD-10-CM | POA: Diagnosis not present

## 2017-02-18 DIAGNOSIS — M25512 Pain in left shoulder: Secondary | ICD-10-CM

## 2017-02-18 MED FILL — AMPHETAMINE SALTS 20 MG TAB: 20 | 30 days supply | Qty: 45 | Fill #0

## 2017-02-18 MED FILL — MOMETASONE FUROATE 50 MCG S: 50 | 30 days supply | Qty: 17 | Fill #2

## 2017-02-18 MED FILL — METHOCARBAMOL 500 MG TABLET: 500 | 15 days supply | Qty: 60 | Fill #1

## 2017-02-18 NOTE — Therapy (Addendum)
[facility] [address]  [place], [place], [address] Phone: [phone]   Fax:  [phone]  Physical Therapy Treatment  Patient Details  Name: Tiffany Parker MRN: [id number] Date of Birth: Oct 31, 1965 Referring Provider: Lamar Blinks, MD  Encounter Date: 02/18/2017      PT End of Session - 02/18/17 0849    Visit Number 8   Number of Visits 12   Date for PT Re-Evaluation 03/08/17   PT Start Time 0844   PT Stop Time 0943   PT Time Calculation (min) 59 min   Activity Tolerance Patient tolerated treatment well   Behavior During Therapy [facility] for tasks assessed/performed      Past Medical History:  Diagnosis Date  . ADHD (attention deficit hyperactivity disorder)   . Allergy   . Arthritis   . Blood transfusion    post delivery  . Blood transfusion without reported diagnosis   . DVT (deep venous thrombosis) ([place]) 04/2012   following spine surgery - right leg  . Headache(784.0)   . PONV (postoperative nausea and vomiting)   . TMJ (dislocation of temporomandibular joint)     Past Surgical History:  Procedure Laterality Date  . ANTERIOR CERVICAL DECOMP/DISCECTOMY FUSION  05/05/2012   Procedure: ANTERIOR CERVICAL DECOMPRESSION/DISCECTOMY FUSION 1 LEVEL;  Surgeon: Elaina Hoops, MD;  Location: [facility];  Service: Neurosurgery;  Laterality: N/A;  Cervical four-five anterior cervical decompression with fusion interbody prothesis plating and bonegraft  . ANTERIOR CERVICAL DECOMP/DISCECTOMY FUSION N/A 05/03/2014   Procedure: CERVICAL FIVE TO SIX, CERVICAL SIX TO SEVEN ANTERIOR CERVICAL DECOMPRESSION/DISCECTOMY FUSION 2 LEVELS;  Surgeon: Elaina Hoops, MD;  Location: [facility];  Service: Neurosurgery;  Laterality: N/A;  . CESAREAN SECTION    . CHOLECYSTECTOMY    . episiotomy   11/19/97  . episiotomy repair  11/1997  . EYE SURGERY      There were no vitals filed for this visit.      Subjective Assessment -  02/18/17 0847    Subjective Pt. still waking up with L shoulder pain after sleeping on this side.  Pt. still has L shoulder pain with reaching.     Patient Stated Goals "To be able to use my arm with no pain & avoid surgery."   Currently in Pain? No/denies   Pain Score 0-No pain   Multiple Pain Sites No                         [facility] Adult PT Treatment/Exercise - 02/18/17 0853      Shoulder Exercises: Supine   Horizontal ABduction Both;Theraband;15 reps   Theraband Level (Shoulder Horizontal ABduction) Level 1 (Yellow)   Horizontal ABduction Limitations hooklying on 1/2 foam bolster; cues for scap. squeeze    External Rotation Both;AROM   Theraband Level (Shoulder External Rotation) Level 1 (Yellow)   External Rotation Limitations Improved range today      Shoulder Exercises: Standing   Extension AROM;15 reps   Theraband Level (Shoulder Extension) Level 2 (Red)   Extension Limitations Constant tactile cues for scap. squeeze    Row AROM;Strengthening;Both;15 reps;Theraband   Theraband Level (Shoulder Row) Level 2 (Red)   Row Limitations constant tactile cues for scap. squeeze      Shoulder Exercises: ROM/Strengthening   UBE (Upper Arm Bike) lvl 2.5 fwd/back x 3' each     Shoulder Exercises: Stretch   Corner Stretch 30 seconds;1 rep  Corner Stretch Limitations 3 way doorway stretch  some pain with high stretch   Other Shoulder Stretches Hooklying pec stretch over 1/2 foam bolster in full cross position x 60"; pain free      Moist Heat Therapy   Number Minutes Moist Heat 15 Minutes   Moist Heat Location Shoulder  L shoulder      Electrical Stimulation   Electrical Stimulation Location L ant/post shoulder & scapular area   Electrical Stimulation Action IFC   Electrical Stimulation Parameters 80-150Hz , intensity to pt. tolerance, 15'    Electrical Stimulation Goals Pain;Tone     Manual Therapy   Manual Therapy Soft tissue mobilization;Myofascial  release;Scapular mobilization;Passive ROM;Joint mobilization   Manual therapy comments Pt. prone, supine & R sidelying   Joint Mobilization L shoulder anterior, posterior, inferior mobs grade III    Soft tissue mobilization L subscap, teres major/minor, rhomboids & LS   Myofascial Release R subscapularis & teres minor/major   Scapular Mobilization L scapula - all directions   Passive ROM L shoulder flexoin, Abduction ER                 PT Education - 02/18/17 1855    Education provided Yes   Education Details row, row extension with red TB issued to pt.    Person(s) Educated Patient   Methods Explanation;Demonstration;Verbal cues;Handout   Comprehension Verbalized understanding;Returned demonstration;Need further instruction;Verbal cues required          PT Short Term Goals - 02/07/17 1049      PT SHORT TERM GOAL #1   Title Independent with initial HEP by 02/08/17   Status Achieved           PT Long Term Goals - 01/30/17 0808      PT LONG TERM GOAL #1   Title Independent with advanced HEP +/- gym program as indicated by 03/08/17   Status On-going     PT LONG TERM GOAL #2   Title L shoulder AROM within 5-10 dg of R w/o pain by 03/08/17   Status On-going     PT LONG TERM GOAL #3   Title L shoulder MMT >/= 4+/5 w/o pain by 03/08/17   Status On-going     PT LONG TERM GOAL #4   Title Pt will report ablility to perform ADL's including dressing/undressing and fastening bra w/o limitation due to L shoulder pain or LOM by 03/08/17   Status On-going     PT LONG TERM GOAL #5   Title Pt will report normal completion of household chores and job tasks including overhead reach/lift w/o limitation due to L shoulder pain or LOM by 03/08/17   Status On-going               Plan - 02/18/17 0849    Clinical Impression Statement Pt. still unable to sleep on L side in bed due to pain.  Some pain with reaching still at work.  Pt. still with shoulder pain in gravity assisted  end range flexion, abduction, and ER today.  Heavy focus on manual STM/TPR to scapular and posterior/inferior shoulder musculature today to improve scapular mobility and overall shoulder function.  Some improvement in pain-free range with therex following manual work however pt. still seems to be progressing slowly.  Scapular strengthening performed in pain-free ranges today to pt. tolerance.  Treatment ending with E-stim/moist heat to decrease tone and post-exercise pain.     PT Treatment/Interventions Patient/family education;Neuromuscular re-education;Therapeutic exercise;Therapeutic activities;Manual techniques;Taping;Dry needling;Electrical Stimulation;Moist Heat;Ultrasound;Cryotherapy;Vasopneumatic Device;Iontophoresis  4mg /ml Dexamethasone;ADLs/Self Care Home Management   PT Next Visit Plan Progress scapular strengthening per pt. tolerance; Postural training for neutral shoulder alignment - anterior stretching; Manual therapy to address muscle tightness & capsular restriction; Progress to RTC as pt able to demonstrate good glenohumeral motion; Modalities PRN      Patient will benefit from skilled therapeutic intervention in order to improve the following deficits and impairments:  Pain, Decreased range of motion, Postural dysfunction, Improper body mechanics, Impaired UE functional use, Increased muscle spasms  Visit Diagnosis: Left shoulder pain, unspecified chronicity  Stiffness of left shoulder, not elsewhere classified  Abnormal posture     Problem List Patient Active Problem List   Diagnosis Date Noted  . Low bone mass 07/09/2016  . Vitamin D deficiency 07/09/2016  . Spinal stenosis of cervical region 05/03/2014  . ADHD (attention deficit hyperactivity disorder) 12/22/2012  . Acne 12/22/2012    Bess Harvest, PTA 02/18/17 6:56 PM  [facility] [address]  [place], [place], [address] Phone: [phone]   Fax:   [phone]  Name: Tiffany Parker MRN: [id number] Date of Birth: 1966-09-25

## 2017-02-21 ENCOUNTER — Ambulatory Visit: Payer: 59

## 2017-02-22 ENCOUNTER — Ambulatory Visit: Payer: 59

## 2017-02-22 DIAGNOSIS — M25512 Pain in left shoulder: Secondary | ICD-10-CM | POA: Diagnosis not present

## 2017-02-22 DIAGNOSIS — R293 Abnormal posture: Secondary | ICD-10-CM | POA: Diagnosis not present

## 2017-02-22 DIAGNOSIS — M25612 Stiffness of left shoulder, not elsewhere classified: Secondary | ICD-10-CM | POA: Diagnosis not present

## 2017-02-22 NOTE — Therapy (Signed)
[facility] [address]  [place], [place], [address] Phone: [phone]   Fax:  [phone]  Physical Therapy Treatment  Patient Details  Name: Tiffany Parker MRN: [id number] Date of Birth: Jan 14, 1966 Referring Provider: Lamar Blinks, MD  Encounter Date: 02/22/2017      PT End of Session - 02/22/17 0803    Visit Number 9   Number of Visits 12   Date for PT Re-Evaluation 03/08/17   PT Start Time 0759   PT Stop Time 0909   PT Time Calculation (min) 70 min   Activity Tolerance Patient tolerated treatment well   Behavior During Therapy [facility] for tasks assessed/performed      Past Medical History:  Diagnosis Date  . ADHD (attention deficit hyperactivity disorder)   . Allergy   . Arthritis   . Blood transfusion    post delivery  . Blood transfusion without reported diagnosis   . DVT (deep venous thrombosis) ([place]) 04/2012   following spine surgery - right leg  . Headache(784.0)   . PONV (postoperative nausea and vomiting)   . TMJ (dislocation of temporomandibular joint)     Past Surgical History:  Procedure Laterality Date  . ANTERIOR CERVICAL DECOMP/DISCECTOMY FUSION  05/05/2012   Procedure: ANTERIOR CERVICAL DECOMPRESSION/DISCECTOMY FUSION 1 LEVEL;  Surgeon: Elaina Hoops, MD;  Location: [facility];  Service: Neurosurgery;  Laterality: N/A;  Cervical four-five anterior cervical decompression with fusion interbody prothesis plating and bonegraft  . ANTERIOR CERVICAL DECOMP/DISCECTOMY FUSION N/A 05/03/2014   Procedure: CERVICAL FIVE TO SIX, CERVICAL SIX TO SEVEN ANTERIOR CERVICAL DECOMPRESSION/DISCECTOMY FUSION 2 LEVELS;  Surgeon: Elaina Hoops, MD;  Location: [facility];  Service: Neurosurgery;  Laterality: N/A;  . CESAREAN SECTION    . CHOLECYSTECTOMY    . episiotomy   11/19/97  . episiotomy repair  11/1997  . EYE SURGERY      There were no vitals filed for this visit.      Subjective Assessment -  02/22/17 0801    Subjective Pt. still waking up when laying on L side with shoulder pain and still has pain with reaching activities.  Pt. states, "my shoulder is feeling about the same today".   Patient Stated Goals "To be able to use my arm with no pain & avoid surgery."   Currently in Pain? No/denies   Pain Score 0-No pain   Multiple Pain Sites No                         [facility] Adult PT Treatment/Exercise - 02/22/17 0827      Shoulder Exercises: Supine   Horizontal ABduction Both;Theraband;15 reps;Strengthening   Theraband Level (Shoulder Horizontal ABduction) Level 1 (Yellow)   Horizontal ABduction Limitations hooklying on 1/2 foam bolster; cues for scap. squeeze    External Rotation AROM;Theraband;15 reps;Both   Theraband Level (Shoulder External Rotation) Level 1 (Yellow)   External Rotation Limitations Laying on 1/2 foam bolster      Shoulder Exercises: Standing   Horizontal ABduction AROM;Strengthening;Both;15 reps   Theraband Level (Shoulder Horizontal ABduction) Level 1 (Yellow)   Horizontal ABduction Limitations at doorseal    External Rotation AROM;15 reps;Left;Strengthening;Theraband   Theraband Level (Shoulder External Rotation) Level 1 (Yellow)   Internal Rotation AROM;Left;15 reps;Theraband   Theraband Level (Shoulder Internal Rotation) Level 1 (Yellow)   Extension AROM;15 reps   Theraband Level (Shoulder Extension) Level 2 (Red)   Extension Limitations Constant tactile  cues for scap. squeeze    Row AROM;Strengthening;Both;15 reps;Theraband   Theraband Level (Shoulder Row) Level 3 (Green)   Row Limitations constant tactile cues for scap. squeeze      Shoulder Exercises: ROM/Strengthening   UBE (Upper Arm Bike) lvl 3.0  fwd/back x 3' each     Shoulder Exercises: Stretch   Other Shoulder Stretches Hooklying pec stretch over 1/2 foam bolster in full cross position x 60"; pain free      Moist Heat Therapy   Number Minutes Moist Heat 15 Minutes    Moist Heat Location Shoulder     Electrical Stimulation   Electrical Stimulation Location L ant/post shoulder & scapular area   Electrical Stimulation Action IFC   Electrical Stimulation Parameters 80-150Hz , intensity to pt. tolerance, 15'    Electrical Stimulation Goals Pain;Tone     Iontophoresis   Type of Iontophoresis Dexamethasone   Location L posterior shoulder  patch #3/6   Dose 1.0 ml, 80 mA-min   Time 4-6 hr patch     Manual Therapy   Manual Therapy Soft tissue mobilization;Myofascial release;Scapular mobilization;Passive ROM;Joint mobilization   Manual therapy comments Pt. prone, supine & R sidelying   Joint Mobilization L shoulder anterior, posterior, inferior mobs grade III    Soft tissue mobilization L subscap, teres major/minor, rhomboids & LS   Myofascial Release R subscapularis & teres minor/major   Scapular Mobilization L scapula - all directions   Passive ROM L shoulder flexoin, Abduction ER                   PT Short Term Goals - 02/07/17 1049      PT SHORT TERM GOAL #1   Title Independent with initial HEP by 02/08/17   Status Achieved           PT Long Term Goals - 01/30/17 0808      PT LONG TERM GOAL #1   Title Independent with advanced HEP +/- gym program as indicated by 03/08/17   Status On-going     PT LONG TERM GOAL #2   Title L shoulder AROM within 5-10 dg of R w/o pain by 03/08/17   Status On-going     PT LONG TERM GOAL #3   Title L shoulder MMT >/= 4+/5 w/o pain by 03/08/17   Status On-going     PT LONG TERM GOAL #4   Title Pt will report ablility to perform ADL's including dressing/undressing and fastening bra w/o limitation due to L shoulder pain or LOM by 03/08/17   Status On-going     PT LONG TERM GOAL #5   Title Pt will report normal completion of household chores and job tasks including overhead reach/lift w/o limitation due to L shoulder pain or LOM by 03/08/17   Status On-going               Plan - 02/22/17  0803    Clinical Impression Statement Pt. reporting she is still having pain with reaching activities and waking in night with pain, stating, "the shoulder is feeling about the same".  Heavy manual focus today on scapular mobility, STM/TPR to shoulder complex, and joint mobs.  Good relief following this with pt. able to complete more advanced RTC and scapular strengthening activity with only occasional end range pain with abduction, ER motions.  Treatment ending with E-stim/moist heat to L shoulder complex and application of ionto patch #3/6 to posterior shoulder over point of most pain.  Pt. progressing well with  strengthening activity however still requiring manual work to shoulder to improve scapular mobility and shoulder mechanics to start treatment.     PT Treatment/Interventions Patient/family education;Neuromuscular re-education;Therapeutic exercise;Therapeutic activities;Manual techniques;Taping;Dry needling;Electrical Stimulation;Moist Heat;Ultrasound;Cryotherapy;Vasopneumatic Device;Iontophoresis 4mg /ml Dexamethasone;ADLs/Self Care Home Management   PT Next Visit Plan Update HEP with more strengthening if tolerated from last visit; Progress scapular strengthening per pt. tolerance; Manual therapy to address muscle tightness & capsular restriction; Progress to RTC as pt able to demonstrate good glenohumeral motion; Modalities PRN      Patient will benefit from skilled therapeutic intervention in order to improve the following deficits and impairments:  Pain, Decreased range of motion, Postural dysfunction, Improper body mechanics, Impaired UE functional use, Increased muscle spasms  Visit Diagnosis: Left shoulder pain, unspecified chronicity  Stiffness of left shoulder, not elsewhere classified  Abnormal posture     Problem List Patient Active Problem List   Diagnosis Date Noted  . Low bone mass 07/09/2016  . Vitamin D deficiency 07/09/2016  . Spinal stenosis of cervical region  05/03/2014  . ADHD (attention deficit hyperactivity disorder) 12/22/2012  . Acne 12/22/2012    Bess Harvest, PTA 02/22/17 9:41 AM  [facility] [address]  [place], [place], [address] Phone: [phone]   Fax:  [phone]  Name: MYCALA WARSHAWSKY MRN: [id number] Date of Birth: 08-Dec-1965

## 2017-02-27 ENCOUNTER — Ambulatory Visit: Payer: 59 | Attending: Family Medicine | Admitting: Physical Therapy

## 2017-02-27 DIAGNOSIS — R293 Abnormal posture: Secondary | ICD-10-CM | POA: Diagnosis not present

## 2017-02-27 DIAGNOSIS — M25612 Stiffness of left shoulder, not elsewhere classified: Secondary | ICD-10-CM | POA: Diagnosis not present

## 2017-02-27 DIAGNOSIS — M25512 Pain in left shoulder: Secondary | ICD-10-CM | POA: Diagnosis not present

## 2017-02-27 NOTE — Therapy (Signed)
[facility] [address]  [place], [place], [address] Phone: [phone]   Fax:  [phone]  Physical Therapy Treatment  Patient Details  Name: Tiffany Parker MRN: [id number] Date of Birth: Dec 22, 1965 Referring Provider: Lamar Blinks, MD  Encounter Date: 02/27/2017      PT End of Session - 02/27/17 0758    Visit Number 10   Number of Visits 12   Date for PT Re-Evaluation 03/08/17   PT Start Time 0758   PT Stop Time 0850   PT Time Calculation (min) 52 min   Activity Tolerance Patient tolerated treatment well   Behavior During Therapy [facility] for tasks assessed/performed      Past Medical History:  Diagnosis Date  . ADHD (attention deficit hyperactivity disorder)   . Allergy   . Arthritis   . Blood transfusion    post delivery  . Blood transfusion without reported diagnosis   . DVT (deep venous thrombosis) ([place]) 04/2012   following spine surgery - right leg  . Headache(784.0)   . PONV (postoperative nausea and vomiting)   . TMJ (dislocation of temporomandibular joint)     Past Surgical History:  Procedure Laterality Date  . ANTERIOR CERVICAL DECOMP/DISCECTOMY FUSION  05/05/2012   Procedure: ANTERIOR CERVICAL DECOMPRESSION/DISCECTOMY FUSION 1 LEVEL;  Surgeon: Elaina Hoops, MD;  Location: [place] [facility];  Service: Neurosurgery;  Laterality: N/A;  Cervical four-five anterior cervical decompression with fusion interbody prothesis plating and bonegraft  . ANTERIOR CERVICAL DECOMP/DISCECTOMY FUSION N/A 05/03/2014   Procedure: CERVICAL FIVE TO SIX, CERVICAL SIX TO SEVEN ANTERIOR CERVICAL DECOMPRESSION/DISCECTOMY FUSION 2 LEVELS;  Surgeon: Elaina Hoops, MD;  Location: [place] [facility];  Service: Neurosurgery;  Laterality: N/A;  . CESAREAN SECTION    . CHOLECYSTECTOMY    . episiotomy   11/19/97  . episiotomy repair  11/1997  . EYE SURGERY      There were no vitals filed for this visit.      Subjective Assessment -  02/27/17 0800    Subjective Pt reports L shoulder has started aching w/o movement over then weekend, first noticed on Sat, and again on Sun but less intense.   Patient Stated Goals "To be able to use my arm with no pain & avoid surgery."   Currently in Pain? No/denies   Pain Score --  up to 7/10 over weekend   Pain Location Shoulder   Pain Orientation Left   Pain Descriptors / Indicators Aching  deep ache   Pain Type Acute pain   Pain Onset More than a month ago   Pain Frequency Intermittent   Aggravating Factors  reaching behind back, dressing (bra & shirt), opening doors   Pain Relieving Factors Motrin, heat   Effect of Pain on Daily Activities difficulty with dressing - hooking bra, shirt over head, avoids use of L arm during chores            [facility] PT Assessment - 02/27/17 0758      Assessment   Medical Diagnosis L RTC tendinitis   Referring Provider Lamar Blinks, MD   Onset Date/Surgical Date --  ~July-Aug 2017, worsening over past 3 months   Hand Dominance Right   Next MD Visit none scheduled   Prior Therapy PT 2013 & 2017 for cervical spine     Observation/Other Assessments   Focus on Therapeutic Outcomes (FOTO)  Shoulder - 42% (58% limitation)     AROM   Left Shoulder Flexion  158 Degrees   Left Shoulder ABduction 144 Degrees   Left Shoulder Internal Rotation 84 Degrees   Left Shoulder External Rotation 98 Degrees     Strength   Overall Strength Comments weakness but no pain with resistance today   Right Shoulder Flexion 4+/5   Right Shoulder ABduction 4+/5   Right Shoulder Internal Rotation 4+/5   Right Shoulder External Rotation 4+/5   Left Shoulder Flexion 4/5   Left Shoulder ABduction 4/5   Left Shoulder Internal Rotation 4/5   Left Shoulder External Rotation 4/5   Right Hand Grip (lbs) 38, 35, 35   Left Hand Grip (lbs) 36, 35, 33                     [facility] Adult PT Treatment/Exercise - 02/27/17 0758      Self-Care   Self-Care Other  Self-Care Comments   Other Self-Care Comments  Self-STM for rhomboids with small ball; UE nerve glides     Shoulder Exercises: Supine   Other Supine Exercises L ulnar nerve glide x10     Shoulder Exercises: Seated   Other Seated Exercises L brachial plexus nerve glide x10     Shoulder Exercises: ROM/Strengthening   UBE (Upper Arm Bike) lvl 3.0  fwd/back x 3' each     Iontophoresis   Type of Iontophoresis Dexamethasone   Location L posterior shoulder   Dose 1.0 ml, 80 mA-min   Time 4-6 hr patch  #4 of 6     Manual Therapy   Manual Therapy Soft tissue mobilization;Myofascial release;Scapular mobilization;Passive ROM   Manual therapy comments Pt supine & R sidelying   Soft tissue mobilization L teres major/minor, UT, LS & rhomboids   Myofascial Release TPR L UT, LS, proximal pec major, teres major/minor    Scapular Mobilization L scapula - all directions   Passive ROM L shoudler MWM flexion & ER                PT Education - 02/27/17 0850    Education provided Yes   Education Details Ulnar & brachial plexus nerve glides, rhomboids sefl-STM   Person(s) Educated Patient   Methods Explanation;Demonstration;Handout   Comprehension Verbalized understanding;Returned demonstration;Need further instruction          PT Short Term Goals - 02/07/17 1049      PT SHORT TERM GOAL #1   Title Independent with initial HEP by 02/08/17   Status Achieved           PT Long Term Goals - 02/27/17 0850      PT LONG TERM GOAL #1   Title Independent with advanced HEP +/- gym program as indicated by 03/08/17   Status On-going     PT LONG TERM GOAL #2   Title L shoulder AROM within 5-10 dg of R w/o pain by 03/08/17   Status Partially Met  met only for ER     PT LONG TERM GOAL #3   Title L shoulder MMT >/= 4+/5 w/o pain by 03/08/17   Status On-going     PT LONG TERM GOAL #4   Title Pt will report ablility to perform ADL's including dressing/undressing and fastening bra w/o  limitation due to L shoulder pain or LOM by 03/08/17   Status On-going     PT LONG TERM GOAL #5   Title Pt will report normal completion of household chores and job tasks including overhead reach/lift w/o limitation due to L shoulder pain or LOM  by 03/08/17   Status On-going               Plan - 02/27/17 0850    Clinical Impression Statement Pt noting new pain w/o movement over the weekend w/o known precipitaing event, although painfree at rest today. Also noting increased shoulder/?radicular L UE pain when pressure placed on elbow (eg - leaning elbow of table) which appears to follow ulnar nerve distribution per pt description. Continued tightness and increased muscle tension with mutiple TPs present t/o shoulder complex, esp upper pec major, teres major/minor, UT, LS and rhomboids. Increased muscle tension/tightness preventing AROM progression other than ER since last measurement, but strength improving with decreased reports of pain with resistance. Given h/o cervical issues with C3-7 fusion and reports of new more radicular pain patterns, question cervical involvement with current pain/restrictions in addtion to shoulder dysfunction. HEP updated to include nerve glides and self-STM with small ball. If recent changes do not alter pt progression with PT, may need to consider referral back to MD. Otherwise given continued deficits, anticipate pt will require recert at end of current POC.   Rehab Potential Good   Clinical Impairments Affecting Rehab Potential cervical fusion x 2 - C4-5 & C3-7   PT Treatment/Interventions Patient/family education;Neuromuscular re-education;Therapeutic exercise;Therapeutic activities;Manual techniques;Taping;Dry needling;Electrical Stimulation;Moist Heat;Ultrasound;Cryotherapy;Vasopneumatic Device;Iontophoresis 18m/ml Dexamethasone;ADLs/Self Care Home Management   PT Next Visit Plan Manual therapy to address muscle tightness & capsular restriction; Progress scapular  strengthening per pt. tolerance & progress to RTC strengthening as pt able to demonstrate good glenohumeral motion - HEp update as indicated; Modalities PRN   Consulted and Agree with Plan of Care Patient      Patient will benefit from skilled therapeutic intervention in order to improve the following deficits and impairments:  Pain, Decreased range of motion, Postural dysfunction, Improper body mechanics, Impaired UE functional use, Increased muscle spasms  Visit Diagnosis: Left shoulder pain, unspecified chronicity  Stiffness of left shoulder, not elsewhere classified  Abnormal posture     Problem List Patient Active Problem List   Diagnosis Date Noted  . Low bone mass 07/09/2016  . Vitamin D deficiency 07/09/2016  . Spinal stenosis of cervical region 05/03/2014  . ADHD (attention deficit hyperactivity disorder) 12/22/2012  . Acne 12/22/2012    JPercival Spanish PT, MPT 02/27/2017, 1:40 PM  [facility] [place] [address]Phone: [phone]  Fax:  [phone] Name: RALLAN BACIGALUPIMRN: [id number] of Birth: [birth year]

## 2017-03-06 ENCOUNTER — Ambulatory Visit: Payer: 59

## 2017-03-06 DIAGNOSIS — R293 Abnormal posture: Secondary | ICD-10-CM | POA: Diagnosis not present

## 2017-03-06 DIAGNOSIS — M25612 Stiffness of left shoulder, not elsewhere classified: Secondary | ICD-10-CM | POA: Diagnosis not present

## 2017-03-06 DIAGNOSIS — M25512 Pain in left shoulder: Secondary | ICD-10-CM | POA: Diagnosis not present

## 2017-03-06 NOTE — Therapy (Signed)
[facility] [address]  [place], [place], [address] Phone: [phone]   Fax:  [phone]  Physical Therapy Treatment  Patient Details  Name: Tiffany Parker MRN: [id number] Date of Birth: Nov 11, 1965 Referring Provider: Lamar Blinks, MD  Encounter Date: 03/06/2017      PT End of Session - 03/06/17 1321    Visit Number 11   Number of Visits 12   Date for PT Re-Evaluation 03/08/17   PT Start Time 5686   PT Stop Time 1416   PT Time Calculation (min) 59 min   Activity Tolerance Patient tolerated treatment well   Behavior During Therapy [facility] for tasks assessed/performed      Past Medical History:  Diagnosis Date  . ADHD (attention deficit hyperactivity disorder)   . Allergy   . Arthritis   . Blood transfusion    post delivery  . Blood transfusion without reported diagnosis   . DVT (deep venous thrombosis) ([place]) 04/2012   following spine surgery - right leg  . Headache(784.0)   . PONV (postoperative nausea and vomiting)   . TMJ (dislocation of temporomandibular joint)     Past Surgical History:  Procedure Laterality Date  . ANTERIOR CERVICAL DECOMP/DISCECTOMY FUSION  05/05/2012   Procedure: ANTERIOR CERVICAL DECOMPRESSION/DISCECTOMY FUSION 1 LEVEL;  Surgeon: Elaina Hoops, MD;  Location: [facility];  Service: Neurosurgery;  Laterality: N/A;  Cervical four-five anterior cervical decompression with fusion interbody prothesis plating and bonegraft  . ANTERIOR CERVICAL DECOMP/DISCECTOMY FUSION N/A 05/03/2014   Procedure: CERVICAL FIVE TO SIX, CERVICAL SIX TO SEVEN ANTERIOR CERVICAL DECOMPRESSION/DISCECTOMY FUSION 2 LEVELS;  Surgeon: Elaina Hoops, MD;  Location: [place] [facility];  Service: Neurosurgery;  Laterality: N/A;  . CESAREAN SECTION    . CHOLECYSTECTOMY    . episiotomy   11/19/97  . episiotomy repair  11/1997  . EYE SURGERY      There were no vitals filed for this visit.      Subjective Assessment -  03/06/17 1322    Subjective Pt. noting she is still having good days and bad days.  Can reach behind back to bra however still with pain with this movement.  Pain levels have not improved significantly with daily activities this past week.     Patient Stated Goals "To be able to use my arm with no pain & avoid surgery."   Currently in Pain? Yes   Pain Score 3    Pain Location Arm   Pain Orientation Left   Pain Descriptors / Indicators Dull   Pain Type Acute pain   Pain Radiating Towards mid upper arm    Pain Frequency Intermittent   Aggravating Factors  Opening doors, reaching behind for bra strip, reaching to seat belt   Pain Relieving Factors Heat, ice, Motrin   Multiple Pain Sites No                         [facility] Adult PT Treatment/Exercise - 03/06/17 1349      Self-Care   Self-Care Other Self-Care Comments   Other Self-Care Comments  Review of median, ulnar, and radial nerve glides     Shoulder Exercises: Supine   Other Supine Exercises L ulnar, median, and radial nerve glides x 10 reps each way     Shoulder Exercises: Standing   External Rotation AROM;15 reps;Left;Strengthening;Theraband   Theraband Level (Shoulder External Rotation) Level 1 (Yellow)  pain at end range  ER; leaning on 1/2 foam bolster on wall      Shoulder Exercises: ROM/Strengthening   UBE (Upper Arm Bike) lvl 3.0  fwd/back x 3' each     Shoulder Exercises: Stretch   Other Shoulder Stretches Hooklying pec stretch over 1/2 foam bolster in full cross position and elevated chest stretch position x 60"; each way    Other Shoulder Stretches L hooklying lats stretch  x 30 sec      Moist Heat Therapy   Number Minutes Moist Heat 15 Minutes   Moist Heat Location Shoulder     Electrical Stimulation   Electrical Stimulation Location L ant/post shoulder & scapular area   Electrical Stimulation Action IFC   Electrical Stimulation Parameters 80-_0 , intensity to pt. tolerance, 15 min     Electrical Stimulation Goals Pain;Tone     Manual Therapy   Manual Therapy Soft tissue mobilization;Myofascial release;Scapular mobilization;Passive ROM   Manual therapy comments Pt supine & R sidelying   Soft tissue mobilization L teres major/minor, UT, LS & rhomboids   Myofascial Release TPR L UT, LS, proximal pec major, teres major/minor    Scapular Mobilization L scapula - all directions   Passive ROM L shoulder PROM flexion, abduction, ER                 PT Education - 03/06/17 1647    Education provided Yes   Education Details Median and radial nerve glides    Person(s) Educated Patient   Methods Explanation;Demonstration;Verbal cues;Handout   Comprehension Verbalized understanding;Returned demonstration;Verbal cues required;Need further instruction          PT Short Term Goals - 02/07/17 1049      PT SHORT TERM GOAL #1   Title Independent with initial HEP by 02/08/17   Status Achieved           PT Long Term Goals - 02/27/17 0850      PT LONG TERM GOAL #1   Title Independent with advanced HEP +/- gym program as indicated by 03/08/17   Status On-going     PT LONG TERM GOAL #2   Title L shoulder AROM within 5-10 dg of R w/o pain by 03/08/17   Status Partially Met  met only for ER     PT LONG TERM GOAL #3   Title L shoulder MMT >/= 4+/5 w/o pain by 03/08/17   Status On-going     PT LONG TERM GOAL #4   Title Pt will report ablility to perform ADL's including dressing/undressing and fastening bra w/o limitation due to L shoulder pain or LOM by 03/08/17   Status On-going     PT LONG TERM GOAL #5   Title Pt will report normal completion of household chores and job tasks including overhead reach/lift w/o limitation due to L shoulder pain or LOM by 03/08/17   Status On-going               Plan - 03/06/17 1620    Clinical Impression Statement Pt. seen to start therapy with report that she is still limited with daily activities and dressing by shoulder  pain.  Pt. with muscular tension/tightness preventing AROM progression in treatment today.  Pain with AROM ER at L shoulder in neutral gravity minimized positions today and radicular type pain reported in L biceps area throughout therex.  Review of all UE nerve glide patterns today with pt. noting some benefit from these after performance.  Heavy manual focus today on scapular mobility, TPR to  pecs, Teres Minor/Major, and UT.  STM to L shoulder and scapular musculature to decrease tone.  Pt. noting 60% improvement with therapy thus far however notes she still has pain reaching to buckle bra and with daily activities.  Pt. made aware that she has 1 visit remaining in POC after today and anticipates discussion with PT at next visit regarding need for further therapy vs. MD f/u regarding limited progress with therapy.     PT Treatment/Interventions Patient/family education;Neuromuscular re-education;Therapeutic exercise;Therapeutic activities;Manual techniques;Taping;Dry needling;Electrical Stimulation;Moist Heat;Ultrasound;Cryotherapy;Vasopneumatic Device;Iontophoresis 75m/ml Dexamethasone;ADLs/Self Care Home Management   PT Next Visit Plan Address further need for therapy vs. f/u with MD; Manual therapy to address muscle tightness & capsular restriction; Progress scapular strengthening per pt. tolerance & progress to RTC strengthening as pt able to demonstrate good glenohumeral motion - HEp update as indicated; Modalities PRN      Patient will benefit from skilled therapeutic intervention in order to improve the following deficits and impairments:  Pain, Decreased range of motion, Postural dysfunction, Improper body mechanics, Impaired UE functional use, Increased muscle spasms  Visit Diagnosis: Left shoulder pain, unspecified chronicity  Stiffness of left shoulder, not elsewhere classified  Abnormal posture     Problem List Patient Active Problem List   Diagnosis Date Noted  . Low bone mass  07/09/2016  . Vitamin D deficiency 07/09/2016  . Spinal stenosis of cervical region 05/03/2014  . ADHD (attention deficit hyperactivity disorder) 12/22/2012  . Acne 12/22/2012    MBess Harvest PTA 03/06/17 4:48 PM  [facility] [address] [place] [address]Phone: [phone]  Fax:  [phone] Name: RZAEDA MCFERRANMRN: [id number] of Birth: 8July 24, 1967

## 2017-03-08 ENCOUNTER — Ambulatory Visit: Payer: 59 | Admitting: Physical Therapy

## 2017-03-08 DIAGNOSIS — M25612 Stiffness of left shoulder, not elsewhere classified: Secondary | ICD-10-CM | POA: Diagnosis not present

## 2017-03-08 DIAGNOSIS — R293 Abnormal posture: Secondary | ICD-10-CM

## 2017-03-08 DIAGNOSIS — M25512 Pain in left shoulder: Secondary | ICD-10-CM | POA: Diagnosis not present

## 2017-03-08 NOTE — Therapy (Addendum)
[facility] [address]  [place], [place], [address] Phone: [phone]   Fax:  [phone]  Physical Therapy Treatment  Patient Details  Name: Tiffany Parker MRN: [id number] Date of Birth: [birth year] Referring Provider: Lamar Blinks, MD  Encounter Date: 03/08/2017      PT End of Session - 03/08/17 1015    Visit Number 12   Number of Visits 12   Date for PT Re-Evaluation 03/08/17   PT Start Time 3016   PT Stop Time 1059   PT Time Calculation (min) 44 min   Activity Tolerance Patient tolerated treatment well   Behavior During Therapy [facility] for tasks assessed/performed      Past Medical History:  Diagnosis Date  . ADHD (attention deficit hyperactivity disorder)   . Allergy   . Arthritis   . Blood transfusion    post delivery  . Blood transfusion without reported diagnosis   . DVT (deep venous thrombosis) ([place]) 04/2012   following spine surgery - right leg  . Headache(784.0)   . PONV (postoperative nausea and vomiting)   . TMJ (dislocation of temporomandibular joint)     Past Surgical History:  Procedure Laterality Date  . ANTERIOR CERVICAL DECOMP/DISCECTOMY FUSION  05/05/2012   Procedure: ANTERIOR CERVICAL DECOMPRESSION/DISCECTOMY FUSION 1 LEVEL;  Surgeon: Elaina Hoops, MD;  Location: [place] [facility];  Service: Neurosurgery;  Laterality: N/A;  Cervical four-five anterior cervical decompression with fusion interbody prothesis plating and bonegraft  . ANTERIOR CERVICAL DECOMP/DISCECTOMY FUSION N/A 05/03/2014   Procedure: CERVICAL FIVE TO SIX, CERVICAL SIX TO SEVEN ANTERIOR CERVICAL DECOMPRESSION/DISCECTOMY FUSION 2 LEVELS;  Surgeon: Elaina Hoops, MD;  Location: [place] [facility];  Service: Neurosurgery;  Laterality: N/A;  . CESAREAN SECTION    . CHOLECYSTECTOMY    . episiotomy   11/19/97  . episiotomy repair  11/1997  . EYE SURGERY      There were no vitals filed for this visit.      Subjective Assessment -  03/08/17 1018    Subjective Pt frustrated by fluctuations btw good days and bad. Notes temporary relief from nerve glides, but not lasting relief. Still havin radicular pain in lateral arm.   Pertinent History 2 cervical spinal fusions - most recent C3-7   Patient Stated Goals "To be able to use my arm with no pain & avoid surgery."   Currently in Pain? Yes   Pain Score 0-No pain  up to 0/10 with certain motions   Pain Location Arm   Pain Orientation Left            [facility] PT Assessment - 03/08/17 1015      Assessment   Medical Diagnosis L RTC tendinitis   Referring Provider Lamar Blinks, MD   Onset Date/Surgical Date --  ~July-Aug 2017, worsening over past 3 months   Hand Dominance Right   Next MD Visit none scheduled     AROM   Right Shoulder Flexion 165 Degrees   Right Shoulder ABduction 176 Degrees   Left Shoulder Flexion 157 Degrees   Left Shoulder ABduction 160 Degrees   Left Shoulder Internal Rotation 78 Degrees   Left Shoulder External Rotation 94 Degrees     Strength   Right Shoulder Flexion 4+/5   Right Shoulder ABduction 4+/5   Right Shoulder Internal Rotation 4+/5   Right Shoulder External Rotation 4+/5   Left Shoulder Flexion 4+/5   Left Shoulder ABduction 4/5   Left Shoulder Internal Rotation  4/5   Left Shoulder External Rotation 4+/5                     [facility] Adult PT Treatment/Exercise - 03/08/17 1015      Shoulder Exercises: Prone   Extension Both;10 reps   Extension Limitations I's over green Pball   External Rotation Both;5 reps   External Rotation Limitations W's over green Pball - deferred d/t pain   Horizontal ABduction 1 Both;10 reps   Horizontal ABduction 1 Limitations T's over green Pball   Horizontal ABduction 2 Both;10 reps   Horizontal ABduction 2 Limitations YI's over green Pball     Shoulder Exercises: ROM/Strengthening   UBE (Upper Arm Bike) lvl 3.0  fwd/back x 3' each     Manual Therapy   Manual Therapy Soft  tissue mobilization;Myofascial release;Scapular mobilization;Passive ROM   Manual therapy comments Pt supine & R sidelying   Soft tissue mobilization L teres major/minor, UT, LS & rhomboids   Myofascial Release TPR L UT, LS, proximal pec major, teres major/minor    Scapular Mobilization L scapula - all directions   Passive ROM L shoudler MWM flexion & ER                PT Education - 03/08/17 1059    Education Details Review of HEP + prone I's, T's & Y's over Pball   Person(s) Educated Patient   Methods Explanation;Demonstration;Handout   Comprehension Verbalized understanding;Returned demonstration          PT Short Term Goals - 02/07/17 1049      PT SHORT TERM GOAL #1   Title Independent with initial HEP by 02/08/17   Status Achieved           PT Long Term Goals - 03/08/17 1021      PT LONG TERM GOAL #1   Title Independent with advanced HEP +/- gym program as indicated by 03/08/17   Status Achieved     PT LONG TERM GOAL #2   Title L shoulder AROM within 5-10 dg of R w/o pain by 03/08/17   Status Partially Met  Met for flexion & ER     PT LONG TERM GOAL #3   Title L shoulder MMT >/= 4+/5 w/o pain by 03/08/17   Status Partially Met  Met for flexion & ER     PT LONG TERM GOAL #4   Title Pt will report ablility to perform ADL's including dressing/undressing and fastening bra w/o limitation due to L shoulder pain or LOM by 03/08/17   Status Partially Met  Improved but still feels ~20% limited at times     PT LONG TERM GOAL #5   Title Pt will report normal completion of household chores and job tasks including overhead reach/lift w/o limitation due to L shoulder pain or LOM by 03/08/17   Status Not Met  Still noting up to 50% limitation               Plan - 03/08/17 1021    Clinical Impression Statement Pt completing final visit in existing POC. Good improvement noted in L shoudler AROM but still somewhat limited as compared to R shoulder, with pain up  to 6/10 at times still noted at extreme end range of L shoulder flexion, abduction & IR. L shoulder strength improved with flexion & ER equivalent to R w/o pain, but abduction & IR mildly weaker with pain reported during resistance for MMT. Pt acknowledging improved functional use  of L UE but remains ~20% limited with ADL task and up to 50% limited with household chores and job tasks. Pt independent with current HEP. Pt wishing to be placed on hold for 30 days at present to allow her to f/u with MD. If she returns, will require recert.    Clinical Impairments Affecting Rehab Potential cervical fusion x 2 - C4-5 & C3-7   PT Treatment/Interventions Patient/family education;Neuromuscular re-education;Therapeutic exercise;Therapeutic activities;Manual techniques;Taping;Dry needling;Electrical Stimulation;Moist Heat;Ultrasound;Cryotherapy;Vasopneumatic Device;Iontophoresis 11m/ml Dexamethasone;ADLs/Self Care Home Management   PT Next Visit Plan 30 day hold; recert required if she returns   Consulted and Agree with Plan of Care Patient      Patient will benefit from skilled therapeutic intervention in order to improve the following deficits and impairments:  Pain, Decreased range of motion, Postural dysfunction, Improper body mechanics, Impaired UE functional use, Increased muscle spasms  Visit Diagnosis: Left shoulder pain, unspecified chronicity  Stiffness of left shoulder, not elsewhere classified  Abnormal posture     Problem List Patient Active Problem List   Diagnosis Date Noted  . Low bone mass 07/09/2016  . Vitamin D deficiency 07/09/2016  . Spinal stenosis of cervical region 05/03/2014  . ADHD (attention deficit hyperactivity disorder) 12/22/2012  . Acne 12/22/2012    JPercival Spanish PT, MPT 03/08/2017, 1:09 PM  [facility] [place] [address]Phone: [phone]  Fax:  [phone] Name:  Tiffany CARVINMRN: [id number] of Birth: 8Dec 23, 1967 PHYSICAL THERAPY DISCHARGE SUMMARY  Visits from Start of Care: 12  Current functional level related to goals / functional outcomes:   Refer to above clinical impression. Pt has not needed to return to PT in 30 days, therefore will proceed with discharge from PT.   Remaining deficits:   As above.   Education / Equipment:   HEP  Plan: Patient agrees to discharge.  Patient goals were partially met. Patient is being discharged due to being pleased with the current functional level.  ?????    JPercival Spanish PT, MPT 04/08/17, 9:27 AM  [facility] [place] [address]Phone: [phone]  Fax:  [phone]

## 2017-03-15 ENCOUNTER — Encounter: Payer: Self-pay | Admitting: Family Medicine

## 2017-03-18 ENCOUNTER — Other Ambulatory Visit (HOSPITAL_COMMUNITY): Payer: Self-pay | Admitting: Neurosurgery

## 2017-03-18 DIAGNOSIS — M5023 Other cervical disc displacement, cervicothoracic region: Secondary | ICD-10-CM

## 2017-03-19 ENCOUNTER — Ambulatory Visit (HOSPITAL_COMMUNITY)
Admission: RE | Admit: 2017-03-19 | Discharge: 2017-03-19 | Disposition: A | Discharge status: Home / Self Care | Payer: 59 | Source: Ambulatory Visit | Attending: Neurosurgery | Admitting: Neurosurgery

## 2017-03-19 ENCOUNTER — Ambulatory Visit (HOSPITAL_COMMUNITY): Payer: 59

## 2017-03-19 ENCOUNTER — Encounter (HOSPITAL_COMMUNITY): Payer: Self-pay | Admitting: Radiology

## 2017-03-19 DIAGNOSIS — M4802 Spinal stenosis, cervical region: Secondary | ICD-10-CM | POA: Diagnosis not present

## 2017-03-19 DIAGNOSIS — M5023 Other cervical disc displacement, cervicothoracic region: Secondary | ICD-10-CM | POA: Diagnosis not present

## 2017-03-22 DIAGNOSIS — M25511 Pain in right shoulder: Secondary | ICD-10-CM | POA: Diagnosis not present

## 2017-03-29 ENCOUNTER — Ambulatory Visit (HOSPITAL_COMMUNITY)
Admission: RE | Admit: 2017-03-29 | Discharge: 2017-03-29 | Disposition: A | Discharge status: Home / Self Care | Payer: 59 | Source: Ambulatory Visit | Attending: Neurosurgery | Admitting: Neurosurgery

## 2017-04-03 DIAGNOSIS — M25512 Pain in left shoulder: Secondary | ICD-10-CM | POA: Diagnosis not present

## 2017-04-12 MED FILL — DEXTROAMP-AMPHETAMIN 20 MG: 20 | 30 days supply | Qty: 45 | Fill #0

## 2017-04-30 MED FILL — DOXYCYCLINE HYC 20 MG TAB: 20 | 90 days supply | Qty: 180 | Fill #1

## 2017-05-02 DIAGNOSIS — M25512 Pain in left shoulder: Secondary | ICD-10-CM | POA: Diagnosis not present

## 2017-05-20 DIAGNOSIS — L700 Acne vulgaris: Secondary | ICD-10-CM | POA: Diagnosis not present

## 2017-05-20 DIAGNOSIS — B355 Tinea imbricata: Secondary | ICD-10-CM | POA: Diagnosis not present

## 2017-05-28 MED FILL — DEXTROAMP-AMPHETAMIN 20 MG: 20 | 30 days supply | Qty: 45 | Fill #0

## 2017-05-30 DIAGNOSIS — M25512 Pain in left shoulder: Secondary | ICD-10-CM | POA: Diagnosis not present

## 2017-08-18 NOTE — Progress Notes (Addendum)
[facility] [address], [place], [place] [address] [phone]  Date:  08/19/2017   Name:  Tiffany Parker   DOB:  1965-11-03   MRN:  [id number]  PCP:  [name], MD    Chief Complaint: Follow-up (Pt for f/u visit and will need refills. Flu vaccine already given at work. )   History of Present Illness:  Tiffany Parker is a 51 y.o. very pleasant female patient who presents with the following:  Follow-up visit today Last seen here in March- she has been well, has not needed to come in since then History of ADHD treated with adderall 20 She is taking 1/2 an adderall once a day. She feels like the medication makes her heart race and she is better on the lower dose She does need a UDS today NCCSR: last filled adderall in July Flu: done at work Labs: due today-  She is NOT fasting today Dexa- 2 years ago.  She would like to have this done at the [facility]  She did PT for her shoulder, and saw Dr. Amedeo Plenty. They did injections and her shoulder is now much better.   Pt also notes that her exercise tolerance seems worse over the last 2-3 months. She will walk a 1/2 mild "and feel like I went 2 miles."  She does not have any chest pain or SOB otherwise.  Never had any cardiac testing and does not have history of CAD herself There is a family history of HTN No first degree relative history of CAD  Never a smoker She did have a provoked DVT about 5 years ago following surgery.  No calf pain or swelling at this time  Patient Active Problem List   Diagnosis Date Noted  . Low bone mass 07/09/2016  . Vitamin D deficiency 07/09/2016  . Spinal stenosis of cervical region 05/03/2014  . ADHD (attention deficit hyperactivity disorder) 12/22/2012  . Acne 12/22/2012    Past Medical History:  Diagnosis Date  . ADHD (attention deficit hyperactivity disorder)   . Allergy   . Arthritis   . Blood transfusion    post  delivery  . Blood transfusion without reported diagnosis   . DVT (deep venous thrombosis) ([place]) 04/2012   following spine surgery - right leg  . Headache(784.0)   . PONV (postoperative nausea and vomiting)   . TMJ (dislocation of temporomandibular joint)     Past Surgical History:  Procedure Laterality Date  . ANTERIOR CERVICAL DECOMP/DISCECTOMY FUSION  05/05/2012   Procedure: ANTERIOR CERVICAL DECOMPRESSION/DISCECTOMY FUSION 1 LEVEL;  Surgeon: Elaina Hoops, MD;  Location: [place] [facility];  Service: Neurosurgery;  Laterality: N/A;  Cervical four-five anterior cervical decompression with fusion interbody prothesis plating and bonegraft  . ANTERIOR CERVICAL DECOMP/DISCECTOMY FUSION N/A 05/03/2014   Procedure: CERVICAL FIVE TO SIX, CERVICAL SIX TO SEVEN ANTERIOR CERVICAL DECOMPRESSION/DISCECTOMY FUSION 2 LEVELS;  Surgeon: Elaina Hoops, MD;  Location: [place] [facility];  Service: Neurosurgery;  Laterality: N/A;  . CESAREAN SECTION    . CHOLECYSTECTOMY    . episiotomy   11/19/97  . episiotomy repair  11/1997  . EYE SURGERY      Social History  Substance Use Topics  . Smoking status: Never Smoker  . Smokeless tobacco: Not on file  . Alcohol use Yes     Comment: socially    Family History  Problem Relation Age of Onset  . Cancer Mother   .  Hypertension Father   . Hypothyroidism Father   . Diabetes Maternal Grandmother   . Stroke Maternal Grandmother   . Cancer Maternal Grandfather        lung  . Hypertension Paternal Grandmother   . Hyperlipidemia Paternal Grandmother   . Heart disease Paternal Grandmother   . Stroke Paternal Grandfather   . Osteoporosis Neg Hx     Allergies  Allergen Reactions  . Other Other (See Comments)    Anesthesia used in July 2013 caused nausea and vomiting  . Prenatal Vitamins Hives    Patient does not remember the brand of the prenatal vitamin that caused her to have issues.   Sarina Ill [Bactrim] Hives    Medication list has been reviewed and  updated.  Current Outpatient Prescriptions on File Prior to Visit  Medication Sig Dispense Refill  . acetaminophen (TYLENOL) 500 MG tablet Take 1,000 mg by mouth daily as needed (pain/headaches).     Marland Kitchen ACZONE 5 % topical gel APPLY TOPICALLY 2 TIMES DAILY. 180 g 3  . Adapalene-Benzoyl Peroxide (EPIDUO) 0.1-2.5 % gel Apply 1 application topically at bedtime. Apply to face 45 g 1  . amphetamine-dextroamphetamine (ADDERALL) 20 MG tablet Take 1/2 tablet (10mg ) 2-3x a day as needed. Ok to fill 60 days after rx 45 tablet 0  . amphetamine-dextroamphetamine (ADDERALL) 20 MG tablet Take 10 mg 2 or 3x daily as needed 45 tablet 0  . amphetamine-dextroamphetamine (ADDERALL) 20 MG tablet Take 10 mg 2 or 3x daily as needed.  Fill 30 days after rx 45 tablet 0  . aspirin-acetaminophen-caffeine (EXCEDRIN MIGRAINE) 539-767-34 MG per tablet Take 1 tablet by mouth daily as needed for headache.     . B Complex-C (B-COMPLEX WITH VITAMIN C) tablet Take 1 tablet by mouth daily.    . Biotin 5 MG TABS Take by mouth.    . Calcium Carbonate-Vitamin D (CALCIUM 600+D) 600-400 MG-UNIT per tablet Take 1 tablet by mouth 2 (two) times daily.    . cholecalciferol (VITAMIN D) 1000 units tablet Take 1,000 Units by mouth daily.    . Dapsone (ACZONE) 5 % topical gel Apply 1 application topically 2 (two) times daily. Apply to face    . HYDROcodone-acetaminophen (NORCO) 5-325 MG per tablet Take 1 tablet by mouth every 6 (six) hours as needed for moderate pain. 60 tablet 0  . magnesium oxide (MAG-OX) 400 MG tablet Take 400 mg by mouth daily.    . methocarbamol (ROBAXIN) 500 MG tablet Take 1 tablet (500 mg total) by mouth every 6 (six) hours as needed for muscle spasms. 60 tablet 1  . mometasone (NASONEX) 50 MCG/ACT nasal spray Place 2 sprays into the nose daily. 17 g 2  . Multiple Vitamin (MULTIVITAMIN WITH MINERALS) TABS Take 1 tablet by mouth daily. Centrum    . Olopatadine HCl (PATADAY) 0.2 % SOLN Place 1 drop into both eyes daily as  needed (itching/allergies).    . Omega-3 Fatty Acids (FISH OIL) 1200 MG CAPS Take by mouth.    Marland Kitchen PATADAY 0.2 % SOLN PLACE 1 DROP IN EACH EYE AS NEEDED FOR DRY/ITCHY EYES OR ALLERGIES 7.5 mL 3  . vitamin A 7500 UNIT capsule Take 7,500 Units by mouth daily.     No current facility-administered medications on file prior to visit.     Review of Systems:  As per HPI- otherwise negative. No fever or chills No cough No recent illness is apparent    Physical Examination: Vitals:   08/19/17 1229  BP:  118/82  Pulse: 94  Temp: 98.2 F (36.8 C)  SpO2: 97%   Vitals:   08/19/17 1229  Weight: 131 lb (59.4 kg)  Height: 5\' 1"  (1.549 m)   Body mass index is 24.75 kg/m. Ideal Body Weight: Weight in (lb) to have BMI = 25: 132  GEN: WDWN, NAD, Non-toxic, A & O x 3, normal weight, looks well HEENT: Atraumatic, Normocephalic. Neck supple. No masses, No LAD.  Bilateral TM wnl, oropharynx normal.  PEERL,EOMI.   Ears and Nose: No external deformity. CV: RRR, No M/G/R. No JVD. No thrill. No extra heart sounds. PULM: CTA B, no wheezes, crackles, rhonchi. No retractions. No resp. distress. No accessory muscle use. ABD: S, NT, ND, +BS. No rebound. No HSM. EXTR: No c/c/e NEURO Normal gait.  PSYCH: Normally interactive. Conversant. Not depressed or anxious appearing.  Calm demeanor.    Assessment and Plan: Attention deficit disorder (ADD) without hyperactivity - Plan: amphetamine-dextroamphetamine (ADDERALL XR) 10 MG 24 hr capsule  Vitamin D deficiency - Plan: Vitamin D (25 hydroxy)  Low bone mass - Plan: Vitamin D (25 hydroxy), DG Bone Density  Screening for hyperlipidemia - Plan: Lipid panel  Medication monitoring encounter - Plan: Pain Mgmt, Profile 8 w/Conf, U  Screening for deficiency anemia - Plan: CBC  Screening for diabetes mellitus - Plan: Comprehensive metabolic panel, Hemoglobin A1c  Rosacea - Plan: doxycycline (PERIOSTAT) 20 MG tablet  Dyspnea on exertion - Plan: Exercise  Tolerance Test, DG Chest 2 View  Estrogen deficiency - Plan: DG Bone Density  Labs due today- ordered as above Time for dexa scan- ordered She has noted some symptoms of heart racing with 20 adderall- will try the 10 mg XR for her, she will let me know how this works for her Refilled her doxycycline that she takes for her rosacea Decreased exercise tolerance- plan an ETT Did cxr today a well- normal  Dg Chest 2 View  Result Date: 08/19/2017 CLINICAL DATA:  Increasing shortness of breath over the past few months, initial encounter EXAM: CHEST  2 VIEW COMPARISON:  None. FINDINGS: The heart size and mediastinal contours are within normal limits. Both lungs are clear. The visualized skeletal structures show postsurgical changes in the cervical spine. IMPRESSION: No active cardiopulmonary disease. Electronically Signed   By: Inez Catalina M.D.   On: 08/19/2017 13:18     Signed Lamar Blinks, MD  Received her labs 10/25- all ok, UDS as expected   Results for orders placed or performed in visit on 08/19/17  CBC  Result Value Ref Range   WBC 7.5 4.0 - 10.5 K/uL   RBC 4.84 3.87 - 5.11 Mil/uL   Platelets 236.0 150.0 - 400.0 K/uL   Hemoglobin 14.4 12.0 - 15.0 g/dL   HCT 42.4 36.0 - 46.0 %   MCV 87.6 78.0 - 100.0 fl   MCHC 33.9 30.0 - 36.0 g/dL   RDW 12.3 11.5 - 15.5 %  Comprehensive metabolic panel  Result Value Ref Range   Sodium 137 135 - 145 mEq/L   Potassium 4.4 3.5 - 5.1 mEq/L   Chloride 103 96 - 112 mEq/L   CO2 28 19 - 32 mEq/L   Glucose, Bld 87 70 - 99 mg/dL   BUN 15 6 - 23 mg/dL   Creatinine, Ser 0.58 0.40 - 1.20 mg/dL   Total Bilirubin 0.3 0.2 - 1.2 mg/dL   Alkaline Phosphatase 72 39 - 117 U/L   AST 16 0 - 37 U/L   ALT 14 0 -  35 U/L   Total Protein 7.2 6.0 - 8.3 g/dL   Albumin 4.4 3.5 - 5.2 g/dL   Calcium 9.4 8.4 - 10.5 mg/dL   GFR 117.36 >60.00 mL/min  Hemoglobin A1c  Result Value Ref Range   Hgb A1c MFr Bld 5.4 4.6 - 6.5 %  Lipid panel  Result Value Ref Range    Cholesterol 178 0 - 200 mg/dL   Triglycerides 57.0 0.0 - 149.0 mg/dL   HDL 89.10 >39.00 mg/dL   VLDL 11.4 0.0 - 40.0 mg/dL   LDL Cholesterol 77 0 - 99 mg/dL   Total CHOL/HDL Ratio 2    NonHDL 88.78   Vitamin D (25 hydroxy)  Result Value Ref Range   VITD 42.68 30.00 - 100.00 ng/mL  Pain Mgmt, Profile 8 w/Conf, U  Result Value Ref Range   Creatinine 50.2 > or = 20. mg/dL   pH 6.61 4.5 - 9.0   Oxidant NEGATIVE <200 mcg/mL   Amphetamines POSITIVE (A) <500 ng/mL   Amphetamine 3,569 (H) <250 ng/mL   medMATCH Amphetamine INCONSISTENT    Methamphetamine NEGATIVE <250 ng/mL   medMATCH Methamphetamine CONSISTENT    Benzodiazepines NEGATIVE <100 ng/mL   medMATCH Benzodiazepines CONSISTENT    Marijuana Metabolite NEGATIVE <20 ng/mL   medMATCH Marijuana Metab CONSISTENT    Cocaine Metabolite NEGATIVE <150 ng/mL   medMATCH Cocaine Metab CONSISTENT    Opiates NEGATIVE <100 ng/mL   medMATCH Opiates CONSISTENT    Oxycodone NEGATIVE <100 ng/mL   medMATCH Oxycodone CONSISTENT    Buprenorphine, Urine NEGATIVE <5 ng/mL   medMATCH Buprenorphine CONSISTENT    MDMA NEGATIVE <500 ng/mL   [facility] MDMA CONSISTENT    Alcohol Metabolites NEGATIVE <500 ng/mL   medMATCH Alcohol Metab CONSISTENT    6 Acetylmorphine NEGATIVE <10 ng/mL   medMATCH 6 Acetylmorphine CONSISTENT

## 2017-08-19 ENCOUNTER — Ambulatory Visit (HOSPITAL_BASED_OUTPATIENT_CLINIC_OR_DEPARTMENT_OTHER)
Admission: RE | Admit: 2017-08-19 | Discharge: 2017-08-19 | Disposition: A | Discharge status: Home / Self Care | Payer: 59 | Source: Ambulatory Visit | Attending: Family Medicine | Admitting: Family Medicine

## 2017-08-19 ENCOUNTER — Ambulatory Visit (INDEPENDENT_AMBULATORY_CARE_PROVIDER_SITE_OTHER): Payer: 59 | Admitting: Family Medicine

## 2017-08-19 VITALS — BP 118/82 | HR 94 | Temp 98.2°F | Ht 61.0 in | Wt 131.0 lb

## 2017-08-19 DIAGNOSIS — R0609 Other forms of dyspnea: Secondary | ICD-10-CM

## 2017-08-19 DIAGNOSIS — E2839 Other primary ovarian failure: Secondary | ICD-10-CM | POA: Diagnosis not present

## 2017-08-19 DIAGNOSIS — Z130 Encounter for screening for diseases of the blood and blood-forming organs and certain disorders involving the immune mechanism: Secondary | ICD-10-CM | POA: Diagnosis not present

## 2017-08-19 DIAGNOSIS — E559 Vitamin D deficiency, unspecified: Secondary | ICD-10-CM

## 2017-08-19 DIAGNOSIS — F909 Attention-deficit hyperactivity disorder, unspecified type: Secondary | ICD-10-CM | POA: Diagnosis not present

## 2017-08-19 DIAGNOSIS — Z79899 Other long term (current) drug therapy: Secondary | ICD-10-CM | POA: Diagnosis not present

## 2017-08-19 DIAGNOSIS — L719 Rosacea, unspecified: Secondary | ICD-10-CM | POA: Diagnosis not present

## 2017-08-19 DIAGNOSIS — Z5181 Encounter for therapeutic drug level monitoring: Secondary | ICD-10-CM

## 2017-08-19 DIAGNOSIS — M8580 Other specified disorders of bone density and structure, unspecified site: Secondary | ICD-10-CM

## 2017-08-19 DIAGNOSIS — Z131 Encounter for screening for diabetes mellitus: Secondary | ICD-10-CM

## 2017-08-19 DIAGNOSIS — F988 Other specified behavioral and emotional disorders with onset usually occurring in childhood and adolescence: Secondary | ICD-10-CM

## 2017-08-19 DIAGNOSIS — Z13220 Encounter for screening for lipoid disorders: Secondary | ICD-10-CM | POA: Diagnosis not present

## 2017-08-19 DIAGNOSIS — R0602 Shortness of breath: Secondary | ICD-10-CM | POA: Diagnosis not present

## 2017-08-19 LAB — LIPID PANEL
CHOL/HDL RATIO: 2
Cholesterol: 178 mg/dL (ref 0–200)
HDL: 89.1 mg/dL (ref >39.00)
LDL Cholesterol: 77 mg/dL (ref 0–99)
NonHDL: 88.78
Triglycerides: 57 mg/dL (ref 0.0–149.0)
VLDL: 11.4 mg/dL (ref 0.0–40.0)

## 2017-08-19 LAB — CBC
HCT: 42.4 % (ref 36.0–46.0)
Hemoglobin: 14.4 g/dL (ref 12.0–15.0)
MCHC: 33.9 g/dL (ref 30.0–36.0)
MCV: 87.6 fl (ref 78.0–100.0)
Platelets: 236 10*3/uL (ref 150.0–400.0)
RBC: 4.84 Mil/uL (ref 3.87–5.11)
RDW: 12.3 % (ref 11.5–15.5)
WBC: 7.5 10*3/uL (ref 4.0–10.5)

## 2017-08-19 LAB — COMPREHENSIVE METABOLIC PANEL
ALBUMIN: 4.4 g/dL (ref 3.5–5.2)
ALK PHOS: 72 U/L (ref 39–117)
ALT: 14 U/L (ref 0–35)
AST: 16 U/L (ref 0–37)
BILIRUBIN TOTAL: 0.3 mg/dL (ref 0.2–1.2)
BUN: 15 mg/dL (ref 6–23)
CO2: 28 mEq/L (ref 19–32)
Calcium: 9.4 mg/dL (ref 8.4–10.5)
Chloride: 103 mEq/L (ref 96–112)
Creatinine, Ser: 0.58 mg/dL (ref 0.40–1.20)
GFR: 117.36 mL/min (ref >60.00)
Glucose, Bld: 87 mg/dL (ref 70–99)
POTASSIUM: 4.4 meq/L (ref 3.5–5.1)
SODIUM: 137 meq/L (ref 135–145)
TOTAL PROTEIN: 7.2 g/dL (ref 6.0–8.3)

## 2017-08-19 LAB — HEMOGLOBIN A1C: HEMOGLOBIN A1C: 5.4 % (ref 4.6–6.5)

## 2017-08-19 MED ORDER — DOXYCYCLINE HYCLATE 20 MG PO TABS: 20.0000 mg | ORAL_TABLET | Freq: Two times a day (BID) | ORAL | 3 refills | Status: DC

## 2017-08-19 MED ORDER — AMPHETAMINE-DEXTROAMPHET ER 10 MG PO CP24
10.0000 mg | ORAL_CAPSULE | Freq: Every day | ORAL | 0 refills | Status: DC
Start: 2017-08-19 — End: 2017-09-13

## 2017-08-19 MED FILL — DOXYCYCLINE HYC 20 MG TAB: 20 | 90 days supply | Qty: 180 | Fill #0

## 2017-08-19 NOTE — Patient Instructions (Signed)
We will get labs and a chest x-ray for you today. I will also set up an exercise stress (treadmill) test for you to make sure your heart looks ok! We will arrange a bone density scan for you Let's try the adderall xr 10 mg- let me know how this works for you and I will be in touch with your albs asap

## 2017-08-20 ENCOUNTER — Encounter: Payer: Self-pay | Admitting: Family Medicine

## 2017-08-20 LAB — VITAMIN D 25 HYDROXY (VIT D DEFICIENCY, FRACTURES): VITD: 42.68 ng/mL (ref 30.00–100.00)

## 2017-08-21 MED FILL — ADDERALL XR 10 MG CAP SA: 10 | 30 days supply | Qty: 30 | Fill #0

## 2017-08-22 ENCOUNTER — Ambulatory Visit (INDEPENDENT_AMBULATORY_CARE_PROVIDER_SITE_OTHER): Payer: 59

## 2017-08-22 ENCOUNTER — Other Ambulatory Visit: Payer: Self-pay | Admitting: Family Medicine

## 2017-08-22 ENCOUNTER — Encounter: Payer: Self-pay | Admitting: Family Medicine

## 2017-08-22 DIAGNOSIS — R0609 Other forms of dyspnea: Secondary | ICD-10-CM

## 2017-08-22 DIAGNOSIS — R9439 Abnormal result of other cardiovascular function study: Secondary | ICD-10-CM

## 2017-08-22 LAB — EXERCISE TOLERANCE TEST
CHL CUP MPHR: 171 {beats}/min
CHL CUP RESTING HR STRESS: 83 {beats}/min
CHL CUP STRESS STAGE 1 SBP: 127 mmHg
CHL CUP STRESS STAGE 10 DBP: 97 mmHg
CHL CUP STRESS STAGE 10 SBP: 138 mmHg
CHL CUP STRESS STAGE 3 HR: 100 {beats}/min
CHL CUP STRESS STAGE 3 SPEED: 1 mph
CHL CUP STRESS STAGE 5 SPEED: 1.7 mph
CHL CUP STRESS STAGE 6 GRADE: 12 %
CHL CUP STRESS STAGE 6 HR: 151 {beats}/min
CHL CUP STRESS STAGE 6 SPEED: 2.5 mph
CHL CUP STRESS STAGE 8 SPEED: 3.4 mph
CHL CUP STRESS STAGE 9 DBP: 88 mmHg
CHL CUP STRESS STAGE 9 GRADE: 0 %
CHL CUP STRESS STAGE 9 SBP: 156 mmHg
CHL RATE OF PERCEIVED EXERTION: 15
CSEPED: 9 min
CSEPEDS: 0 s
CSEPEW: 10.1 METS
CSEPHR: 100 %
CSEPPHR: 171 {beats}/min
Percent of predicted max HR: 100 %
Stage 1 DBP: 95 mmHg
Stage 1 Grade: 0 %
Stage 1 HR: 88 {beats}/min
Stage 1 Speed: 0 mph
Stage 10 Grade: 0 %
Stage 10 HR: 110 {beats}/min
Stage 10 Speed: 0 mph
Stage 2 Grade: 0 %
Stage 2 HR: 90 {beats}/min
Stage 2 Speed: 0 mph
Stage 3 Grade: 0 %
Stage 4 Grade: 0.1 %
Stage 4 HR: 100 {beats}/min
Stage 4 Speed: 1 mph
Stage 5 DBP: 86 mmHg
Stage 5 Grade: 10 %
Stage 5 HR: 127 {beats}/min
Stage 5 SBP: 157 mmHg
Stage 7 Grade: 14 %
Stage 7 HR: 171 {beats}/min
Stage 7 Speed: 3.4 mph
Stage 8 Grade: 14 %
Stage 8 HR: 171 {beats}/min
Stage 9 HR: 144 {beats}/min
Stage 9 Speed: 0 mph

## 2017-08-22 LAB — PAIN MGMT, PROFILE 8 W/CONF, U
6 ACETYLMORPHINE: NEGATIVE ng/mL (ref <10)
ALCOHOL METABOLITES: NEGATIVE ng/mL (ref <500)
AMPHETAMINES: POSITIVE ng/mL — AB (ref <500)
Amphetamine: 3569 ng/mL — ABNORMAL HIGH (ref <250)
Benzodiazepines: NEGATIVE ng/mL (ref <100)
Buprenorphine, Urine: NEGATIVE ng/mL (ref <5)
COCAINE METABOLITE: NEGATIVE ng/mL (ref <150)
Creatinine: 50.2 mg/dL
MDMA: NEGATIVE ng/mL (ref <500)
METHAMPHETAMINE: NEGATIVE ng/mL (ref <250)
Marijuana Metabolite: NEGATIVE ng/mL (ref <20)
OPIATES: NEGATIVE ng/mL (ref <100)
OXIDANT: NEGATIVE ug/mL (ref <200)
OXYCODONE: NEGATIVE ng/mL (ref <100)
pH: 6.61 (ref 4.5–9.0)

## 2017-08-28 ENCOUNTER — Encounter: Payer: Self-pay | Admitting: Cardiology

## 2017-08-28 ENCOUNTER — Ambulatory Visit (INDEPENDENT_AMBULATORY_CARE_PROVIDER_SITE_OTHER): Payer: 59 | Admitting: Cardiology

## 2017-08-28 VITALS — BP 130/70 | HR 88 | Resp 10 | Ht 60.5 in | Wt 132.8 lb

## 2017-08-28 DIAGNOSIS — R9439 Abnormal result of other cardiovascular function study: Secondary | ICD-10-CM | POA: Insufficient documentation

## 2017-08-28 DIAGNOSIS — R0789 Other chest pain: Secondary | ICD-10-CM | POA: Insufficient documentation

## 2017-08-28 DIAGNOSIS — F909 Attention-deficit hyperactivity disorder, unspecified type: Secondary | ICD-10-CM | POA: Diagnosis not present

## 2017-08-28 NOTE — Progress Notes (Signed)
Cardiology Consultation:     Date:  08/28/2017   ID:  Tiffany Parker, DOB 1965/12/21, MRN [id number]  PCP:  Tiffany Mclean, MD  Cardiologist:  [name], MD   Referring MD: Tiffany Mclean, MD   Chief Complaint  Patient presents with  . Abnormal testing  Abnormal stress test  History of Present Illness:    Tiffany Parker is a 51 y.o. female who is being seen today for the evaluation of abnormal stress test at the request of Copland, Tiffany Filler, MD.  She used to exercise on a regular basis but stopped doing this about a year ago because she had to take care of her sick mother.  Is that time she gained about 20 pounds.  She complained of having some fatigue and tiredness recently.  She said anytime she tries to walk up stairs she will get some strength sensation of the chest.  She does not use expression pain or tightness she is just sad that she is difficult for her to breathe.  She ended up getting stressed that she walk on the treadmill for 9 minutes did not have any symptoms except fatigue and shortness of breath her EKG however showed some abnormalities.  She is here to talk about it.  Her risk factors for coronary artery disease include some family history, questionable borderline hypertension.  Never smoked, she got normal cholesterol.  Past Medical History:  Diagnosis Date  . ADHD (attention deficit hyperactivity disorder)   . Allergy   . Arthritis   . Blood transfusion    post delivery  . Blood transfusion without reported diagnosis   . DVT (deep venous thrombosis) ([place]) 04/2012   following spine surgery - right leg  . Headache(784.0)   . PONV (postoperative nausea and vomiting)   . TMJ (dislocation of temporomandibular joint)     Past Surgical History:  Procedure Laterality Date  . ANTERIOR CERVICAL DECOMP/DISCECTOMY FUSION  05/05/2012   Procedure: ANTERIOR CERVICAL DECOMPRESSION/DISCECTOMY FUSION 1 LEVEL;  Surgeon: Elaina Hoops, MD;  Location: [facility]  [facility];  Service: Neurosurgery;  Laterality: N/A;  Cervical four-five anterior cervical decompression with fusion interbody prothesis plating and bonegraft  . ANTERIOR CERVICAL DECOMP/DISCECTOMY FUSION N/A 05/03/2014   Procedure: CERVICAL FIVE TO SIX, CERVICAL SIX TO SEVEN ANTERIOR CERVICAL DECOMPRESSION/DISCECTOMY FUSION 2 LEVELS;  Surgeon: Elaina Hoops, MD;  Location: [facility] ORS;  Service: Neurosurgery;  Laterality: N/A;  . CESAREAN SECTION    . CHOLECYSTECTOMY    . episiotomy   11/19/97  . episiotomy repair  11/1997  . EYE SURGERY      Current Medications: Current Meds  Medication Sig  . acetaminophen (TYLENOL) 500 MG tablet Take 1,000 mg by mouth daily as needed (pain/headaches).   Marland Kitchen ACZONE 5 % topical gel APPLY TOPICALLY 2 TIMES DAILY.  Marland Kitchen Adapalene-Benzoyl Peroxide (EPIDUO) 0.1-2.5 % gel Apply 1 application topically at bedtime. Apply to face  . amphetamine-dextroamphetamine (ADDERALL XR) 10 MG 24 hr capsule Take 1 capsule (10 mg total) by mouth daily.  Marland Kitchen aspirin-acetaminophen-caffeine (EXCEDRIN MIGRAINE) 250-250-65 MG per tablet Take 1 tablet by mouth daily as needed for headache.   . B Complex-C (B-COMPLEX WITH VITAMIN C) tablet Take 1 tablet by mouth daily.  . Biotin 5 MG TABS Take by mouth.  . Calcium Carbonate-Vitamin D (CALCIUM 600+D) 600-400 MG-UNIT per tablet Take 1 tablet by mouth 2 (two) times daily.  . Cholecalciferol (VITAMIN D) 2000 units CAPS Take 2,000 Units by mouth daily.   Marland Kitchen  doxycycline (PERIOSTAT) 20 MG tablet Take 1 tablet (20 mg total) by mouth 2 (two) times daily.  . magnesium oxide (MAG-OX) 400 MG tablet Take 400 mg by mouth daily.  . methocarbamol (ROBAXIN) 500 MG tablet Take 1 tablet (500 mg total) by mouth every 6 (six) hours as needed for muscle spasms.  . mometasone (NASONEX) 50 MCG/ACT nasal spray Place 2 sprays into the nose daily.  . Multiple Vitamin (MULTIVITAMIN WITH MINERALS) TABS Take 1 tablet by mouth daily. Centrum  . Omega-3 Fatty Acids (FISH OIL) 1200  MG CAPS Take by mouth.  Marland Kitchen PATADAY 0.2 % SOLN PLACE 1 DROP IN [facility] EYE AS NEEDED FOR DRY/ITCHY EYES OR ALLERGIES     Allergies:   Other; Prenatal vitamins; and Septra [bactrim]   Social History   Social History  . Marital status: Married    Spouse name: N/A  . Number of children: N/A  . Years of education: N/A   Occupational History  . RN [employer]   Social History Main Topics  . Smoking status: Never Smoker  . Smokeless tobacco: Never Used  . Alcohol use Yes     Comment: socially  . Drug use: No  . Sexual activity: Yes   Other Topics Concern  . None   Social History Narrative   Married. Education: [employer]. Exercise: Aerobics 3 days a week for 40 minutes.     Family History: The patient's family history includes Cancer in her maternal grandfather and mother; Diabetes in her maternal grandmother; Heart disease in her paternal grandmother; Hyperlipidemia in her paternal grandmother; Hypertension in her father and paternal grandmother; Hypothyroidism in her father; Stroke in her maternal grandmother and paternal grandfather. There is no history of Osteoporosis. ROS:   Please see the history of present illness.    All 14 point review of systems negative except as described per history of present illness.  EKGs/Labs/Other Studies Reviewed:    The following studies were reviewed today:   EKG:  EKG is  ordered today.  The ekg ordered today demonstrates   Recent Labs: 08/19/2017: ALT 14; BUN 15; Creatinine, Ser 0.58; Hemoglobin 14.4; Platelets 236.0; Potassium 4.4; Sodium 137  Recent Lipid Panel    Component Value Date/Time   CHOL 178 08/19/2017 1255   TRIG 57.0 08/19/2017 1255   HDL 89.10 08/19/2017 1255   CHOLHDL 2 08/19/2017 1255   VLDL 11.4 08/19/2017 1255   LDLCALC 77 08/19/2017 1255    Physical Exam:    VS:  BP 130/70   Pulse 88   Resp 10   Ht 5' 0.5" (1.537 m)   Wt 132 lb 12.8 oz (60.2 kg)   BMI 25.51 kg/m     Wt Readings from Last 3 Encounters:    08/28/17 132 lb 12.8 oz (60.2 kg)  08/19/17 131 lb (59.4 kg)  01/21/17 122 lb 6.4 oz (55.5 kg)     GEN:  Well nourished, well developed in no acute distress HEENT: Normal NECK: No JVD; No carotid bruits LYMPHATICS: No lymphadenopathy CARDIAC: RRR, no murmurs, no rubs, no gallops RESPIRATORY:  Clear to auscultation without rales, wheezing or rhonchi  ABDOMEN: Soft, non-tender, non-distended MUSCULOSKELETAL:  No edema; No deformity  SKIN: Warm and dry NEUROLOGIC:  Alert and oriented x 3 PSYCHIATRIC:  Normal affect   ASSESSMENT:    1. Abnormal stress ECG with treadmill   2. Abnormal stress test   3. Atypical chest pain   4. Attention deficit hyperactivity disorder (ADHD), unspecified ADHD type  PLAN:    In order of problems listed above:  1. Abnormal stress test EKG treadmill stress test: I talked to her about options and the situation option being medical therapy, cardiac catheterization, different modality of stress testing.  We are aware that the woman can have a falsely abnormal stress test quite often.  Therefore we decided to proceed with stress echocardiogram.  She is already taking aspirin and does not have an exertional symptoms.  I asked her not to shortness of heart while exercising now until we do stress test.  Also told her if she still having pain or she decided to a different modality of testing is needed will be ready to proceed with  2. Atypical chest pain: Will be addressed with a different modality of stress testing 3. ADHD: Followed by internal medicine team   Medication Adjustments/Labs and Tests Ordered: Current medicines are reviewed at length with the patient today.  Concerns regarding medicines are outlined above.  Orders Placed This Encounter  Procedures  . ECHOCARDIOGRAM STRESS TEST   No orders of the defined types were placed in this encounter.   Signed, Park Liter, MD, [facility]. 08/28/2017 9:07 AM    [facility]

## 2017-08-28 NOTE — Patient Instructions (Signed)
Medication Instructions:  Your physician recommends that you continue on your current medications as directed. Please refer to the Current Medication list given to you today.  1. Avoid all over-the-counter antihistamines except Claritin/Loratadine and Zyrtec/Cetrizine. 2. Avoid all combination including cold sinus allergies flu decongestant and sleep medications 3. You can use Robitussin DM Mucinex and Mucinex DM for cough. 4. can use Tylenol aspirin ibuprofen and naproxen but no combinations such as sleep or sinus.  Labwork: None   Testing/Procedures: Your physician has requested that you have a stress echocardiogram. For further information please visit [email or web address]. Please follow instruction sheet as given.  Please report to [address] [place], [place] the day of your testing.   Follow-Up: Your physician recommends that you schedule a follow-up appointment in: 1 month   Any Other Special Instructions Will Be Listed Below (If Applicable).  Please note that any paperwork needing to be filled out by the provider will need to be addressed at the front desk prior to seeing the provider. Please note that any paperwork FMLA, Disability or other documents regarding health condition is subject to a $25.00 charge that must be received prior to completion of paperwork in the form of a money order or check.    If you need a refill on your cardiac medications before your next appointment, please call your pharmacy.

## 2017-09-03 ENCOUNTER — Telehealth (HOSPITAL_COMMUNITY): Payer: Self-pay | Admitting: *Deleted

## 2017-09-03 NOTE — Telephone Encounter (Signed)
Left message on voicemail per DPR in reference to upcoming appointment scheduled on 09/09/17 at 7:30 with detailed instructions given per Stress Test Requisition Sheet for the test. LM to arrive 30 minutes early, and that it is imperative to arrive on time for appointment to keep from having the test rescheduled. If you need to cancel or reschedule your appointment, please call the office within 24 hours of your appointment. Failure to do so may result in a cancellation of your appointment, and a $50 no show fee. Phone number given for call back for any questions. Tiffany Parker

## 2017-09-09 ENCOUNTER — Other Ambulatory Visit (HOSPITAL_COMMUNITY): Payer: 59

## 2017-09-09 ENCOUNTER — Ambulatory Visit (HOSPITAL_COMMUNITY): Payer: 59

## 2017-09-09 ENCOUNTER — Ambulatory Visit (HOSPITAL_COMMUNITY): Payer: 59 | Attending: Cardiology

## 2017-09-09 DIAGNOSIS — I501 Left ventricular failure: Secondary | ICD-10-CM | POA: Insufficient documentation

## 2017-09-09 DIAGNOSIS — R9439 Abnormal result of other cardiovascular function study: Secondary | ICD-10-CM | POA: Insufficient documentation

## 2017-09-12 ENCOUNTER — Encounter (INDEPENDENT_AMBULATORY_CARE_PROVIDER_SITE_OTHER): Payer: Self-pay

## 2017-09-12 ENCOUNTER — Other Ambulatory Visit (HOSPITAL_BASED_OUTPATIENT_CLINIC_OR_DEPARTMENT_OTHER): Payer: 59

## 2017-09-13 ENCOUNTER — Encounter: Payer: Self-pay | Admitting: Family Medicine

## 2017-09-13 ENCOUNTER — Ambulatory Visit (HOSPITAL_BASED_OUTPATIENT_CLINIC_OR_DEPARTMENT_OTHER)
Admission: RE | Admit: 2017-09-13 | Discharge: 2017-09-13 | Disposition: A | Discharge status: Home / Self Care | Payer: 59 | Source: Ambulatory Visit | Attending: Family Medicine | Admitting: Family Medicine

## 2017-09-13 DIAGNOSIS — M85852 Other specified disorders of bone density and structure, left thigh: Secondary | ICD-10-CM | POA: Diagnosis not present

## 2017-09-13 DIAGNOSIS — M8580 Other specified disorders of bone density and structure, unspecified site: Secondary | ICD-10-CM | POA: Diagnosis not present

## 2017-09-13 DIAGNOSIS — E2839 Other primary ovarian failure: Secondary | ICD-10-CM | POA: Diagnosis not present

## 2017-09-13 DIAGNOSIS — Z13820 Encounter for screening for osteoporosis: Secondary | ICD-10-CM | POA: Insufficient documentation

## 2017-09-13 DIAGNOSIS — F988 Other specified behavioral and emotional disorders with onset usually occurring in childhood and adolescence: Secondary | ICD-10-CM

## 2017-09-13 MED ORDER — AMPHETAMINE-DEXTROAMPHET ER 10 MG PO CP24: 10.0000 mg | ORAL_CAPSULE | Freq: Every day | ORAL | 0 refills | Status: DC

## 2017-09-13 MED ORDER — AMPHETAMINE-DEXTROAMPHET ER 10 MG PO CP24
10.0000 mg | ORAL_CAPSULE | Freq: Every day | ORAL | 0 refills | Status: DC
Start: 2017-09-13 — End: 2017-09-16

## 2017-09-14 ENCOUNTER — Encounter: Payer: Self-pay | Admitting: Family Medicine

## 2017-09-16 ENCOUNTER — Ambulatory Visit: Payer: 59 | Admitting: Cardiology

## 2017-09-16 ENCOUNTER — Encounter: Payer: Self-pay | Admitting: Family Medicine

## 2017-09-16 ENCOUNTER — Encounter: Payer: Self-pay | Admitting: Cardiology

## 2017-09-16 VITALS — BP 124/76 | HR 84 | Resp 12 | Ht 60.5 in | Wt 132.0 lb

## 2017-09-16 DIAGNOSIS — R0789 Other chest pain: Secondary | ICD-10-CM

## 2017-09-16 DIAGNOSIS — Z01812 Encounter for preprocedural laboratory examination: Secondary | ICD-10-CM | POA: Diagnosis not present

## 2017-09-16 DIAGNOSIS — I420 Dilated cardiomyopathy: Secondary | ICD-10-CM | POA: Diagnosis not present

## 2017-09-16 DIAGNOSIS — R9439 Abnormal result of other cardiovascular function study: Secondary | ICD-10-CM | POA: Diagnosis not present

## 2017-09-16 MED ORDER — LOSARTAN POTASSIUM 25 MG PO TABS: 25.0000 mg | ORAL_TABLET | Freq: Every day | ORAL | 12 refills | Status: DC

## 2017-09-16 MED ORDER — CARVEDILOL 3.125 MG PO TABS: 3.1250 mg | ORAL_TABLET | Freq: Two times a day (BID) | ORAL | 12 refills | Status: DC

## 2017-09-16 MED FILL — LOSARTAN POTASSIUM 25 MG TA: 25 | 30 days supply | Qty: 30 | Fill #0

## 2017-09-16 MED FILL — CARVEDILOL 3.125 MG TABLET: 3.125 | 30 days supply | Qty: 60 | Fill #0

## 2017-09-16 NOTE — Progress Notes (Signed)
Cardiology Office Note:    Date:  09/16/2017   ID:  Tiffany Parker, DOB July 25, 1966, MRN [id number]  PCP:  Tiffany Mclean, MD  Cardiologist:  Tiffany Campus, MD    Referring MD: Tiffany Mclean, MD   Chief Complaint  Patient presents with  . Follow up on testing  Have abnormal stress test  History of Present Illness:    Tiffany Parker is a 51 y.o. female with abnormal stress test.  She also complained of having some shortness of breath swelling of lower extremities and weight gain.  Not been happening over a few months.  She did have plain EKG treadmill stress test which was abnormal but since she is a young woman with not much past medical history we decided to do a different kind of stress test she had stress echocardiogram which showed diminished left ventricular ejection fraction of 35%.  There is surprising discovery about the kind of stuck making sense with her symptoms.  Described to have some atypical chest pain but no exertional tightness squeezing pressure burning chest.  She gets short of breath quite easily while walking stairs.  Past Medical History:  Diagnosis Date  . ADHD (attention deficit hyperactivity disorder)   . Allergy   . Arthritis   . Blood transfusion    post delivery  . Blood transfusion without reported diagnosis   . DVT (deep venous thrombosis) ([place]) 04/2012   following spine surgery - right leg  . Headache(784.0)   . PONV (postoperative nausea and vomiting)   . TMJ (dislocation of temporomandibular joint)     Past Surgical History:  Procedure Laterality Date  . ANTERIOR CERVICAL DECOMPRESSION/DISCECTOMY FUSION 1 LEVEL N/A 05/05/2012   Performed by Elaina Hoops, MD at [facility]  . CERVICAL FIVE TO SIX, CERVICAL SIX TO SEVEN ANTERIOR CERVICAL DECOMPRESSION/DISCECTOMY FUSION 2 LEVELS N/A 05/03/2014   Performed by Elaina Hoops, MD at [facility]URO ORS  . CESAREAN SECTION    . CHOLECYSTECTOMY    . episiotomy   11/19/97  . episiotomy repair   11/1997  . EYE SURGERY      Current Medications: Current Meds  Medication Sig  . acetaminophen (TYLENOL) 500 MG tablet Take 1,000 mg by mouth daily as needed (pain/headaches).   Marland Kitchen ACZONE 5 % topical gel APPLY TOPICALLY 2 TIMES DAILY.  Marland Kitchen Adapalene-Benzoyl Peroxide (EPIDUO) 0.1-2.5 % gel Apply 1 application topically at bedtime. Apply to face  . amphetamine-dextroamphetamine (ADDERALL XR) 10 MG 24 hr capsule Take 1 capsule (10 mg total) daily by mouth.  Marland Kitchen aspirin EC 81 MG tablet Take 81 mg daily by mouth.  Marland Kitchen aspirin-acetaminophen-caffeine (EXCEDRIN MIGRAINE) 250-250-65 MG per tablet Take 1 tablet by mouth daily as needed for headache.   . B Complex-C (B-COMPLEX WITH VITAMIN C) tablet Take 1 tablet by mouth daily.  . Biotin 5 MG TABS Take by mouth.  . Calcium Carbonate-Vitamin D (CALCIUM 600+D) 600-400 MG-UNIT per tablet Take 1 tablet by mouth 2 (two) times daily.  . Cholecalciferol (VITAMIN D) 2000 units CAPS Take 2,000 Units by mouth daily.   Marland Kitchen doxycycline (PERIOSTAT) 20 MG tablet Take 1 tablet (20 mg total) by mouth 2 (two) times daily.  . magnesium oxide (MAG-OX) 400 MG tablet Take 400 mg by mouth daily.  . methocarbamol (ROBAXIN) 500 MG tablet Take 1 tablet (500 mg total) by mouth every 6 (six) hours as needed for muscle spasms.  . mometasone (NASONEX) 50 MCG/ACT nasal spray Place 2 sprays into  the nose daily.  . Multiple Vitamin (MULTIVITAMIN WITH MINERALS) TABS Take 1 tablet by mouth daily. Centrum  . Omega-3 Fatty Acids (FISH OIL) 1200 MG CAPS Take by mouth.  Marland Kitchen PATADAY 0.2 % SOLN PLACE 1 DROP IN [facility] EYE AS NEEDED FOR DRY/ITCHY EYES OR ALLERGIES     Allergies:   Other; Prenatal vitamins; and Septra [bactrim]   Social History   Socioeconomic History  . Marital status: Married    Spouse name: None  . Number of children: None  . Years of education: None  . Highest education level: None  Social Needs  . Financial resource strain: None  . Food insecurity - worry: None  . Food  insecurity - inability: None  . Transportation needs - medical: None  . Transportation needs - non-medical: None  Occupational History  . Occupation: Programmer, multimedia: [employer]  Tobacco Use  . Smoking status: Never Smoker  . Smokeless tobacco: Never Used  Substance and Sexual Activity  . Alcohol use: Yes    Comment: socially  . Drug use: No  . Sexual activity: Yes  Other Topics Concern  . None  Social History Narrative   Married. Education: [employer]. Exercise: Aerobics 3 days a week for 40 minutes.     Family History: The patient's family history includes Cancer in her maternal grandfather and mother; Diabetes in her maternal grandmother; Heart disease in her paternal grandmother; Hyperlipidemia in her paternal grandmother; Hypertension in her father and paternal grandmother; Hypothyroidism in her father; Stroke in her maternal grandmother and paternal grandfather. There is no history of Osteoporosis. ROS:   Please see the history of present illness.    All 14 point review of systems negative except as described per history of present illness  EKGs/Labs/Other Studies Reviewed:      Recent Labs: 08/19/2017: ALT 14; BUN 15; Creatinine, Ser 0.58; Hemoglobin 14.4; Platelets 236.0; Potassium 4.4; Sodium 137  Recent Lipid Panel    Component Value Date/Time   CHOL 178 08/19/2017 1255   TRIG 57.0 08/19/2017 1255   HDL 89.10 08/19/2017 1255   CHOLHDL 2 08/19/2017 1255   VLDL 11.4 08/19/2017 1255   LDLCALC 77 08/19/2017 1255    Physical Exam:    VS:  BP 124/76   Pulse 84   Resp 12   Ht 5' 0.5" (1.537 m)   Wt 132 lb (59.9 kg)   BMI 25.36 kg/m     Wt Readings from Last 3 Encounters:  09/16/17 132 lb (59.9 kg)  08/28/17 132 lb 12.8 oz (60.2 kg)  08/19/17 131 lb (59.4 kg)     GEN:  Well nourished, well developed in no acute distress HEENT: Normal NECK: No JVD; No carotid bruits LYMPHATICS: No lymphadenopathy CARDIAC: RRR, no murmurs, no rubs, no gallops RESPIRATORY:   Clear to auscultation without rales, wheezing or rhonchi  ABDOMEN: Soft, non-tender, non-distended MUSCULOSKELETAL:  No edema; No deformity  SKIN: Warm and dry LOWER EXTREMITIES: no swelling NEUROLOGIC:  Alert and oriented x 3 PSYCHIATRIC:  Normal affect   ASSESSMENT:    1. Abnormal stress test   2. Dilated cardiomyopathy ([place])   3. Atypical chest pain    PLAN:    In order of problems listed above:  1. Abnormal stress test: Now with stress echocardiogram showing baseline cardia myopathy during cardiac catheterization some must to rule out ischemia as source of her cardia myopathy.  I presented this option to her, I described cardiac catheterization including risks as well as benefits.  We talked about alternatives.  She agreed to proceed with cardiac catheterization.  In the meantime we will do laboratory test to make sure it safe to do cardiac catheterization. 2. Dilated cardiomyopathy: Again the etiology of this phenomenon is unclear at the moment I will do EKG today to see if I can put her on beta-blocker she will go on carvedilol 3.125 twice daily, I will also start her with losartan 25, Chem-7 will be done after that.  In the meantime we will work on trying to establish the etiology of this phenomenon.  I will do TSH today, she will also be scheduled to have full echocardiogram. 3. Atypical chest pain: This will be addressed with cardiac catheterization.   She was asked to stop Adderall since it is contraindicated in people with cardiomyopathy and potentially coronary artery disease.  Medication Adjustments/Labs and Tests Ordered: Current medicines are reviewed at length with the patient today.  Concerns regarding medicines are outlined above.  No orders of the defined types were placed in this encounter.  Medication changes: No orders of the defined types were placed in this encounter.   Signed, Park Liter, MD, [facility] 09/16/2017 10:13 AM    [place]

## 2017-09-16 NOTE — H&P (View-Only) (Signed)
Cardiology Office Note:    Date:  09/16/2017   ID:  Tiffany Parker, DOB 07/13/1966, MRN [id number]  PCP:  [name], MD  Cardiologist:  [name], MD    Referring MD: [name], MD   Chief Complaint  Patient presents with  . Follow up on testing  Have abnormal stress test  History of Present Illness:    Tiffany Parker is a 51 y.o. female with abnormal stress test.  She also complained of having some shortness of breath swelling of lower extremities and weight gain.  Not been happening over a few months.  She did have plain EKG treadmill stress test which was abnormal but since she is a young woman with not much past medical history we decided to do a different kind of stress test she had stress echocardiogram which showed diminished left ventricular ejection fraction of 35%.  There is surprising discovery about the kind of stuck making sense with her symptoms.  Described to have some atypical chest pain but no exertional tightness squeezing pressure burning chest.  She gets short of breath quite easily while walking stairs.  Past Medical History:  Diagnosis Date  . ADHD (attention deficit hyperactivity disorder)   . Allergy   . Arthritis   . Blood transfusion    post delivery  . Blood transfusion without reported diagnosis   . DVT (deep venous thrombosis) ([place]) 04/2012   following spine surgery - right leg  . Headache(784.0)   . PONV (postoperative nausea and vomiting)   . TMJ (dislocation of temporomandibular joint)     Past Surgical History:  Procedure Laterality Date  . ANTERIOR CERVICAL DECOMPRESSION/DISCECTOMY FUSION 1 LEVEL N/A 05/05/2012   Performed by Elaina Hoops, MD at [facility] NEURO ORS  . CERVICAL FIVE TO SIX, CERVICAL SIX TO SEVEN ANTERIOR CERVICAL DECOMPRESSION/DISCECTOMY FUSION 2 LEVELS N/A 05/03/2014   Performed by Elaina Hoops, MD at [facility] NEURO ORS  . CESAREAN SECTION    . CHOLECYSTECTOMY    . episiotomy   11/19/97  . episiotomy repair   11/1997  . EYE SURGERY      Current Medications: Current Meds  Medication Sig  . acetaminophen (TYLENOL) 500 MG tablet Take 1,000 mg by mouth daily as needed (pain/headaches).   Marland Kitchen ACZONE 5 % topical gel APPLY TOPICALLY 2 TIMES DAILY.  Marland Kitchen Adapalene-Benzoyl Peroxide (EPIDUO) 0.1-2.5 % gel Apply 1 application topically at bedtime. Apply to face  . amphetamine-dextroamphetamine (ADDERALL XR) 10 MG 24 hr capsule Take 1 capsule (10 mg total) daily by mouth.  Marland Kitchen aspirin EC 81 MG tablet Take 81 mg daily by mouth.  Marland Kitchen aspirin-acetaminophen-caffeine (EXCEDRIN MIGRAINE) 250-250-65 MG per tablet Take 1 tablet by mouth daily as needed for headache.   . B Complex-C (B-COMPLEX WITH VITAMIN C) tablet Take 1 tablet by mouth daily.  . Biotin 5 MG TABS Take by mouth.  . Calcium Carbonate-Vitamin D (CALCIUM 600+D) 600-400 MG-UNIT per tablet Take 1 tablet by mouth 2 (two) times daily.  . Cholecalciferol (VITAMIN D) 2000 units CAPS Take 2,000 Units by mouth daily.   Marland Kitchen doxycycline (PERIOSTAT) 20 MG tablet Take 1 tablet (20 mg total) by mouth 2 (two) times daily.  . magnesium oxide (MAG-OX) 400 MG tablet Take 400 mg by mouth daily.  . methocarbamol (ROBAXIN) 500 MG tablet Take 1 tablet (500 mg total) by mouth every 6 (six) hours as needed for muscle spasms.  . mometasone (NASONEX) 50 MCG/ACT nasal spray Place 2 sprays into  the nose daily.  . Multiple Vitamin (MULTIVITAMIN WITH MINERALS) TABS Take 1 tablet by mouth daily. Centrum  . Omega-3 Fatty Acids (FISH OIL) 1200 MG CAPS Take by mouth.  Marland Kitchen PATADAY 0.2 % SOLN PLACE 1 DROP IN [facility] EYE AS NEEDED FOR DRY/ITCHY EYES OR ALLERGIES     Allergies:   Other; Prenatal vitamins; and Septra [bactrim]   Social History   Socioeconomic History  . Marital status: Married    Spouse name: None  . Number of children: None  . Years of education: None  . Highest education level: None  Social Needs  . Financial resource strain: None  . Food insecurity - worry: None  . Food  insecurity - inability: None  . Transportation needs - medical: None  . Transportation needs - non-medical: None  Occupational History  . Occupation: Programmer, multimedia: [employer]  Tobacco Use  . Smoking status: Never Smoker  . Smokeless tobacco: Never Used  Substance and Sexual Activity  . Alcohol use: Yes    Comment: socially  . Drug use: No  . Sexual activity: Yes  Other Topics Concern  . None  Social History Narrative   Married. Education: [employer]. Exercise: Aerobics 3 days a week for 40 minutes.     Family History: The patient's family history includes Cancer in her maternal grandfather and mother; Diabetes in her maternal grandmother; Heart disease in her paternal grandmother; Hyperlipidemia in her paternal grandmother; Hypertension in her father and paternal grandmother; Hypothyroidism in her father; Stroke in her maternal grandmother and paternal grandfather. There is no history of Osteoporosis. ROS:   Please see the history of present illness.    All 14 point review of systems negative except as described per history of present illness  EKGs/Labs/Other Studies Reviewed:      Recent Labs: 08/19/2017: ALT 14; BUN 15; Creatinine, Ser 0.58; Hemoglobin 14.4; Platelets 236.0; Potassium 4.4; Sodium 137  Recent Lipid Panel    Component Value Date/Time   CHOL 178 08/19/2017 1255   TRIG 57.0 08/19/2017 1255   HDL 89.10 08/19/2017 1255   CHOLHDL 2 08/19/2017 1255   VLDL 11.4 08/19/2017 1255   LDLCALC 77 08/19/2017 1255    Physical Exam:    VS:  BP 124/76   Pulse 84   Resp 12   Ht 5' 0.5" (1.537 m)   Wt 132 lb (59.9 kg)   BMI 25.36 kg/m     Wt Readings from Last 3 Encounters:  09/16/17 132 lb (59.9 kg)  08/28/17 132 lb 12.8 oz (60.2 kg)  08/19/17 131 lb (59.4 kg)     GEN:  Well nourished, well developed in no acute distress HEENT: Normal NECK: No JVD; No carotid bruits LYMPHATICS: No lymphadenopathy CARDIAC: RRR, no murmurs, no rubs, no gallops RESPIRATORY:   Clear to auscultation without rales, wheezing or rhonchi  ABDOMEN: Soft, non-tender, non-distended MUSCULOSKELETAL:  No edema; No deformity  SKIN: Warm and dry LOWER EXTREMITIES: no swelling NEUROLOGIC:  Alert and oriented x 3 PSYCHIATRIC:  Normal affect   ASSESSMENT:    1. Abnormal stress test   2. Dilated cardiomyopathy ([place])   3. Atypical chest pain    PLAN:    In order of problems listed above:  1. Abnormal stress test: Now with stress echocardiogram showing baseline cardia myopathy during cardiac catheterization some must to rule out ischemia as source of her cardia myopathy.  I presented this option to her, I described cardiac catheterization including risks as well as benefits.  We talked about alternatives.  She agreed to proceed with cardiac catheterization.  In the meantime we will do laboratory test to make sure it safe to do cardiac catheterization. 2. Dilated cardiomyopathy: Again the etiology of this phenomenon is unclear at the moment I will do EKG today to see if I can put her on beta-blocker she will go on carvedilol 3.125 twice daily, I will also start her with losartan 25, Chem-7 will be done after that.  In the meantime we will work on trying to establish the etiology of this phenomenon.  I will do TSH today, she will also be scheduled to have full echocardiogram. 3. Atypical chest pain: This will be addressed with cardiac catheterization.   She was asked to stop Adderall since it is contraindicated in people with cardiomyopathy and potentially coronary artery disease.  Medication Adjustments/Labs and Tests Ordered: Current medicines are reviewed at length with the patient today.  Concerns regarding medicines are outlined above.  No orders of the defined types were placed in this encounter.  Medication changes: No orders of the defined types were placed in this encounter.   Signed, Park Liter, MD, [facility] 09/16/2017 10:13 AM    [place]

## 2017-09-16 NOTE — Patient Instructions (Signed)
Medication Instructions:  Your physician has recommended you make the following change in your medication:  1.) START Losartan 25 mg daily.  2.) START carvedilol 3.125 twice daily. 3.) STOP Adderall XR 10 mg daily.    Labwork: Pre procedure labs today, cbc; basic metabolic panel; PT/INR; and he wants to check your thyroid.   Testing/Procedures: EKG today in office.   Your physician has requested that you have an echocardiogram. Echocardiography is a painless test that uses sound waves to create images of your heart. It provides your doctor with information about the size and shape of your heart and how well your heart's chambers and valves are working. This procedure takes approximately one hour. There are no restrictions for this procedure.    [place] [address] [place] [address] Dept: [phone] Loc: [phone]  MIREYA MEDITZ  09/16/2017  You are scheduled for a Cardiac Catheterization on Thursday, November 29 with Dr. Daneen Schick.  1. Please arrive at the [facility] (Main Entrance A) at [facility]: [address] [place], [place] [address] at 8:00 AM (two hours before your procedure to ensure your preparation). Free valet parking service is available.   Special note: Every effort is made to have your procedure done on time. Please understand that emergencies sometimes delay scheduled procedures.  2. Diet: Do not eat or drink anything after midnight prior to your procedure except sips of water to take medications.  3. Labs: obtained today in office.   4. Medication instructions in preparation for your procedure:  Stop taking, Cozaar (Losartan) on Wednesday, November 28.    On the morning of your procedure, take your Aspirin and any morning medicines NOT listed above.  You may use sips of water.  5. Plan for one night stay--bring personal belongings. 6.  Bring a current list of your medications and current insurance cards. 7. You MUST have a responsible person to drive you home. 8. Someone MUST be with you the first 24 hours after you arrive home or your discharge will be delayed. 9. Please wear clothes that are easy to get on and off and wear slip-on shoes.  Thank you for allowing [place] to care for you!   -- [facility]   Follow-Up: Your physician recommends that you schedule a follow-up appointment in: 2-3 weeks after heart cath.   Any Other Special Instructions Will Be Listed Below (If Applicable).  Please note that any paperwork needing to be filled out by the provider will need to be addressed at the front desk prior to seeing the provider. Please note that any paperwork FMLA, Disability or other documents regarding health condition is subject to a $25.00 charge that must be received prior to completion of paperwork in the form of a money order or check.     If you need a refill on your cardiac medications before your next appointment, please call your pharmacy.

## 2017-09-17 ENCOUNTER — Telehealth: Payer: Self-pay | Admitting: Cardiology

## 2017-09-17 LAB — CBC WITH DIFFERENTIAL/PLATELET
BASOS: 1 %
Basophils Absolute: 0.1 10*3/uL (ref 0.0–0.2)
EOS (ABSOLUTE): 0.1 10*3/uL (ref 0.0–0.4)
EOS: 1 %
HEMOGLOBIN: 14.5 g/dL (ref 11.1–15.9)
Hematocrit: 43.8 % (ref 34.0–46.6)
IMMATURE GRANS (ABS): 0 10*3/uL (ref 0.0–0.1)
IMMATURE GRANULOCYTES: 0 %
LYMPHS ABS: 2.1 10*3/uL (ref 0.7–3.1)
Lymphs: 28 %
MCH: 28.4 pg (ref 26.6–33.0)
MCHC: 33.1 g/dL (ref 31.5–35.7)
MCV: 86 fL (ref 79–97)
MONOS ABS: 0.7 10*3/uL (ref 0.1–0.9)
Monocytes: 9 %
Neutrophils Absolute: 4.7 10*3/uL (ref 1.4–7.0)
Neutrophils: 61 %
PLATELETS: 253 10*3/uL (ref 150–379)
RBC: 5.11 x10E6/uL (ref 3.77–5.28)
RDW: 12.7 % (ref 12.3–15.4)
WBC: 7.6 10*3/uL (ref 3.4–10.8)

## 2017-09-17 LAB — BASIC METABOLIC PANEL
BUN / CREAT RATIO: 24 — AB (ref 9–23)
BUN: 15 mg/dL (ref 6–24)
CALCIUM: 9.7 mg/dL (ref 8.7–10.2)
CHLORIDE: 101 mmol/L (ref 96–106)
CO2: 24 mmol/L (ref 20–29)
Creatinine, Ser: 0.63 mg/dL (ref 0.57–1.00)
GFR calc Af Amer: 122 mL/min/{1.73_m2} (ref >59)
GFR calc non Af Amer: 106 mL/min/{1.73_m2} (ref >59)
GLUCOSE: 90 mg/dL (ref 65–99)
Potassium: 4.8 mmol/L (ref 3.5–5.2)
Sodium: 138 mmol/L (ref 134–144)

## 2017-09-17 LAB — PROTIME-INR
INR: 1 (ref 0.8–1.2)
PROTHROMBIN TIME: 10.4 s (ref 9.1–12.0)

## 2017-09-17 LAB — TSH: TSH: 3.61 u[IU]/mL (ref 0.450–4.500)

## 2017-09-17 NOTE — Telephone Encounter (Signed)
Patient states that she works weekends and wants to know if she will be "ok" to work the weekend after cath, Please call her.

## 2017-09-17 NOTE — Telephone Encounter (Signed)
S/w pt and advised that Dr. Agustin Cree suggest that ideally it would not be best for her to work that weekend, however, she could ask Dr. Tamala Julian after her heart cath is performed if she should work that weekend or not based on the procedure outcome. Pt verbalized understanding.

## 2017-09-18 ENCOUNTER — Other Ambulatory Visit (HOSPITAL_BASED_OUTPATIENT_CLINIC_OR_DEPARTMENT_OTHER): Payer: 59

## 2017-09-23 DIAGNOSIS — D2372 Other benign neoplasm of skin of left lower limb, including hip: Secondary | ICD-10-CM | POA: Diagnosis not present

## 2017-09-23 DIAGNOSIS — L700 Acne vulgaris: Secondary | ICD-10-CM | POA: Diagnosis not present

## 2017-09-23 DIAGNOSIS — B355 Tinea imbricata: Secondary | ICD-10-CM | POA: Diagnosis not present

## 2017-09-24 ENCOUNTER — Telehealth: Payer: Self-pay

## 2017-09-24 NOTE — Telephone Encounter (Signed)
Spoke with husband per DPR.  Pt is a Marine scientist at [employer].  Notified that patient should arrive at 8:00 am at [facility] on 09/26/2017 and notified to be sure Pt takes ASA prior to arrival.  Husband indicates understanding.  No further needs.

## 2017-09-25 ENCOUNTER — Ambulatory Visit: Payer: 59 | Admitting: Cardiology

## 2017-09-26 ENCOUNTER — Ambulatory Visit (HOSPITAL_COMMUNITY)
Admission: RE | Admit: 2017-09-26 | Discharge: 2017-09-26 | Disposition: A | Discharge status: Home / Self Care | Payer: 59 | Source: Ambulatory Visit | Attending: Interventional Cardiology | Admitting: Interventional Cardiology

## 2017-09-26 ENCOUNTER — Encounter (HOSPITAL_COMMUNITY)
Admission: RE | Disposition: A | Discharge status: Home / Self Care | Payer: Self-pay | Source: Ambulatory Visit | Attending: Interventional Cardiology

## 2017-09-26 DIAGNOSIS — I420 Dilated cardiomyopathy: Secondary | ICD-10-CM | POA: Diagnosis not present

## 2017-09-26 DIAGNOSIS — M1990 Unspecified osteoarthritis, unspecified site: Secondary | ICD-10-CM | POA: Insufficient documentation

## 2017-09-26 DIAGNOSIS — F909 Attention-deficit hyperactivity disorder, unspecified type: Secondary | ICD-10-CM | POA: Diagnosis not present

## 2017-09-26 DIAGNOSIS — Z7982 Long term (current) use of aspirin: Secondary | ICD-10-CM | POA: Insufficient documentation

## 2017-09-26 DIAGNOSIS — R0789 Other chest pain: Secondary | ICD-10-CM | POA: Insufficient documentation

## 2017-09-26 DIAGNOSIS — M26609 Unspecified temporomandibular joint disorder, unspecified side: Secondary | ICD-10-CM | POA: Diagnosis not present

## 2017-09-26 DIAGNOSIS — Z86718 Personal history of other venous thrombosis and embolism: Secondary | ICD-10-CM | POA: Diagnosis not present

## 2017-09-26 DIAGNOSIS — R9439 Abnormal result of other cardiovascular function study: Secondary | ICD-10-CM

## 2017-09-26 HISTORY — PX: LEFT HEART CATH AND CORONARY ANGIOGRAPHY: CATH118249

## 2017-09-26 LAB — PREGNANCY, URINE: PREG TEST UR: NEGATIVE

## 2017-09-26 SURGERY — LEFT HEART CATH AND CORONARY ANGIOGRAPHY
Anesthesia: LOCAL

## 2017-09-26 MED ORDER — ACETAMINOPHEN 325 MG PO TABS: 650.0000 mg | ORAL_TABLET | ORAL | Status: DC | PRN

## 2017-09-26 MED ORDER — HEPARIN SODIUM (PORCINE) 1000 UNIT/ML IJ SOLN
INTRAMUSCULAR | Status: AC
  Filled 2017-09-26: qty 1

## 2017-09-26 MED ORDER — SODIUM CHLORIDE 0.9 % WEIGHT BASED INFUSION
3.0000 mL/kg/h | INTRAVENOUS | Status: AC
Start: 2017-09-26 — End: 2017-09-26

## 2017-09-26 MED ORDER — MIDAZOLAM HCL 2 MG/2ML IJ SOLN
INTRAMUSCULAR | Status: DC | PRN
  Administered 2017-09-26 (×2): 1 mg via INTRAVENOUS

## 2017-09-26 MED ORDER — SODIUM CHLORIDE 0.9% FLUSH: 3.0000 mL | Freq: Two times a day (BID) | INTRAVENOUS | Status: DC

## 2017-09-26 MED ORDER — MIDAZOLAM HCL 2 MG/2ML IJ SOLN
INTRAMUSCULAR | Status: AC
  Filled 2017-09-26: qty 2

## 2017-09-26 MED ORDER — LIDOCAINE HCL (PF) 1 % IJ SOLN
INTRAMUSCULAR | Status: DC | PRN
  Administered 2017-09-26: 5 mL

## 2017-09-26 MED ORDER — ONDANSETRON HCL 4 MG/2ML IJ SOLN: 4.0000 mg | Freq: Four times a day (QID) | INTRAMUSCULAR | Status: DC | PRN

## 2017-09-26 MED ORDER — HEPARIN SODIUM (PORCINE) 1000 UNIT/ML IJ SOLN
INTRAMUSCULAR | Status: DC | PRN
  Administered 2017-09-26: 4000 [IU] via INTRAVENOUS

## 2017-09-26 MED ORDER — SODIUM CHLORIDE 0.9% FLUSH: 3.0000 mL | INTRAVENOUS | Status: DC | PRN

## 2017-09-26 MED ORDER — IOPAMIDOL (ISOVUE-370) INJECTION 76%
INTRAVENOUS | Status: DC | PRN
  Administered 2017-09-26: 100 mL via INTRA_ARTERIAL

## 2017-09-26 MED ORDER — SODIUM CHLORIDE 0.9 % IV SOLN: 250.0000 mL | INTRAVENOUS | Status: DC | PRN

## 2017-09-26 MED ORDER — FENTANYL CITRATE (PF) 100 MCG/2ML IJ SOLN
INTRAMUSCULAR | Status: AC
  Filled 2017-09-26: qty 2

## 2017-09-26 MED ORDER — FENTANYL CITRATE (PF) 100 MCG/2ML IJ SOLN
INTRAMUSCULAR | Status: DC | PRN
  Administered 2017-09-26: 50 ug via INTRAVENOUS

## 2017-09-26 MED ORDER — VERAPAMIL HCL 2.5 MG/ML IV SOLN
INTRAVENOUS | Status: DC | PRN
  Administered 2017-09-26: 10 mL via INTRA_ARTERIAL

## 2017-09-26 MED ORDER — IOPAMIDOL (ISOVUE-370) INJECTION 76%
INTRAVENOUS | Status: AC
  Filled 2017-09-26: qty 100

## 2017-09-26 MED ORDER — VERAPAMIL HCL 2.5 MG/ML IV SOLN
INTRAVENOUS | Status: AC
  Filled 2017-09-26: qty 2

## 2017-09-26 MED ORDER — HEPARIN (PORCINE) IN NACL 2-0.9 UNIT/ML-% IJ SOLN
INTRAMUSCULAR | Status: AC
  Filled 2017-09-26: qty 500

## 2017-09-26 MED ORDER — SODIUM CHLORIDE 0.9 % WEIGHT BASED INFUSION
1.0000 mL/kg/h | INTRAVENOUS | Status: DC
Start: 2017-09-26 — End: 2017-09-26

## 2017-09-26 MED ORDER — ASPIRIN 81 MG PO CHEW: 81.0000 mg | CHEWABLE_TABLET | ORAL | Status: DC

## 2017-09-26 MED ORDER — LIDOCAINE HCL (PF) 1 % IJ SOLN
INTRAMUSCULAR | Status: AC
  Filled 2017-09-26: qty 30

## 2017-09-26 MED ORDER — HEPARIN (PORCINE) IN NACL 2-0.9 UNIT/ML-% IJ SOLN
INTRAMUSCULAR | Status: AC | PRN
  Administered 2017-09-26: 1000 mL

## 2017-09-26 MED ORDER — ASPIRIN 81 MG PO CHEW: 81.0000 mg | CHEWABLE_TABLET | Freq: Every day | ORAL | Status: DC

## 2017-09-26 MED ORDER — SODIUM CHLORIDE 0.9 % IV SOLN: INTRAVENOUS | Status: DC

## 2017-09-26 SURGICAL SUPPLY — 15 items
CATH INFINITI 5 FR JL3.5 (CATHETERS) ×2 IMPLANT
CATH INFINITI 5FR ANG PIGTAIL (CATHETERS) ×2 IMPLANT
CATH INFINITI JR4 5F (CATHETERS) ×2 IMPLANT
COVER PRB 48X5XTLSCP FOLD TPE (BAG) ×1 IMPLANT
COVER PROBE 5X48 (BAG) ×1
DEVICE RAD COMP TR BAND LRG (VASCULAR PRODUCTS) ×2 IMPLANT
GLIDESHEATH SLEND A-KIT 6F 22G (SHEATH) ×2 IMPLANT
GUIDEWIRE INQWIRE 1.5J.035X260 (WIRE) ×1 IMPLANT
INQWIRE 1.5J .035X260CM (WIRE) ×2
KIT HEART LEFT (KITS) ×2 IMPLANT
PACK CARDIAC CATHETERIZATION (CUSTOM PROCEDURE TRAY) ×2 IMPLANT
SYR MEDRAD MARK V 150ML (SYRINGE) ×2 IMPLANT
TRANSDUCER W/STOPCOCK (MISCELLANEOUS) ×2 IMPLANT
TUBING CIL FLEX 10 FLL-RA (TUBING) ×2 IMPLANT
WIRE HI TORQ VERSACORE-J 145CM (WIRE) ×2 IMPLANT

## 2017-09-26 NOTE — Discharge Instructions (Signed)

## 2017-09-26 NOTE — Interval H&P Note (Signed)
Cath Lab Visit (complete for each Cath Lab visit)  Clinical Evaluation Leading to the Procedure:   ACS: No.  Non-ACS:    Anginal Classification: CCS III  Anti-ischemic medical therapy: No Therapy  Non-Invasive Test Results: Intermediate-risk stress test findings: cardiac mortality 1-3%/year  Prior CABG: No previous CABG      History and Physical Interval Note:  09/26/2017 11:53 AM  Tiffany Parker  has presented today for surgery, with the diagnosis of abnormal stress - cp  The various methods of treatment have been discussed with the patient and family. After consideration of risks, benefits and other options for treatment, the patient has consented to  Procedure(s): LEFT HEART CATH AND CORONARY ANGIOGRAPHY (N/A) as a surgical intervention .  The patient's history has been reviewed, patient examined, no change in status, stable for surgery.  I have reviewed the patient's chart and labs.  Questions were answered to the patient's satisfaction.     Belva Crome III

## 2017-09-26 NOTE — Research (Signed)
OPTIMIZE Informed Consent   Subject Name: Tiffany Parker  Subject met inclusion and exclusion criteria.  The informed consent form, study requirements and expectations were reviewed with the subject and questions and concerns were addressed prior to the signing of the consent form.  The subject verbalized understanding of the trail requirements.  The subject agreed to participate in the OPTIMIZE trial and signed the informed consent.  The informed consent was obtained prior to performance of any protocol-specific procedures for the subject.  A copy of the signed informed consent was given to the subject and a copy was placed in the subject's medical record. Only applicable if randomized.   Philemon Kingdom D 09/26/2017, 1010AM

## 2017-09-26 NOTE — Progress Notes (Signed)
Report received from [facility] care assumed at this time

## 2017-09-27 ENCOUNTER — Encounter (HOSPITAL_COMMUNITY): Payer: Self-pay | Admitting: Interventional Cardiology

## 2017-10-08 ENCOUNTER — Ambulatory Visit: Payer: 59 | Admitting: Cardiology

## 2017-10-09 ENCOUNTER — Encounter: Payer: Self-pay | Admitting: Family Medicine

## 2017-10-09 ENCOUNTER — Ambulatory Visit (HOSPITAL_BASED_OUTPATIENT_CLINIC_OR_DEPARTMENT_OTHER)
Admission: RE | Admit: 2017-10-09 | Discharge: 2017-10-09 | Disposition: A | Discharge status: Home / Self Care | Payer: 59 | Source: Ambulatory Visit | Attending: Cardiology | Admitting: Cardiology

## 2017-10-09 DIAGNOSIS — R0789 Other chest pain: Secondary | ICD-10-CM | POA: Diagnosis not present

## 2017-10-09 DIAGNOSIS — I420 Dilated cardiomyopathy: Secondary | ICD-10-CM | POA: Diagnosis not present

## 2017-10-09 DIAGNOSIS — R9439 Abnormal result of other cardiovascular function study: Secondary | ICD-10-CM | POA: Diagnosis not present

## 2017-10-09 DIAGNOSIS — I081 Rheumatic disorders of both mitral and tricuspid valves: Secondary | ICD-10-CM | POA: Diagnosis not present

## 2017-10-09 MED FILL — DOXYCYCLINE HYC 20 MG TAB: 20 | 30 days supply | Qty: 60 | Fill #1

## 2017-10-09 NOTE — Progress Notes (Signed)
Echocardiogram 2D Echocardiogram has been performed.  Tiffany Parker 10/09/2017, 9:16 AM

## 2017-10-10 ENCOUNTER — Encounter: Payer: Self-pay | Admitting: Cardiology

## 2017-10-10 ENCOUNTER — Ambulatory Visit (INDEPENDENT_AMBULATORY_CARE_PROVIDER_SITE_OTHER): Payer: 59 | Admitting: Cardiology

## 2017-10-10 VITALS — BP 106/70 | HR 67 | Ht 60.5 in | Wt 134.4 lb

## 2017-10-10 DIAGNOSIS — I420 Dilated cardiomyopathy: Secondary | ICD-10-CM | POA: Diagnosis not present

## 2017-10-10 DIAGNOSIS — R0789 Other chest pain: Secondary | ICD-10-CM | POA: Diagnosis not present

## 2017-10-10 DIAGNOSIS — F909 Attention-deficit hyperactivity disorder, unspecified type: Secondary | ICD-10-CM | POA: Diagnosis not present

## 2017-10-10 MED ORDER — LOSARTAN POTASSIUM 50 MG PO TABS: 50.0000 mg | ORAL_TABLET | Freq: Every day | ORAL | 12 refills | Status: DC

## 2017-10-10 MED FILL — LOSARTAN POTASSIUM 50 MG TA: 50 | 30 days supply | Qty: 30 | Fill #0

## 2017-10-10 NOTE — Progress Notes (Signed)
Cardiology Office Note:    Date:  10/10/2017   ID:  Tiffany Parker, DOB 25-Jan-1966, MRN [id number]  PCP:  [name], MD  Cardiologist:  [name], MD    Referring MD: [name], MD   Chief Complaint  Patient presents with  . Follow up on cath  Doing well very happy with results of cardiac catheterization  History of Present Illness:    Tiffany Parker is a 51 y.o. female with history of dilated cardiomyopathy lowest ejection fraction reported at 35%.  Echocardiogram repeated showing ejection fraction 45.  Cardiac catheterization ejection fraction 45-50%.  She is on small dose of ARB as well as beta-blocker.  Doing well denies having shortness of breath tightness squeezing pressure burning chest overall improved quite significantly.  Past Medical History:  Diagnosis Date  . ADHD (attention deficit hyperactivity disorder)   . Allergy   . Arthritis   . Blood transfusion    post delivery  . Blood transfusion without reported diagnosis   . DVT (deep venous thrombosis) ([place]) 04/2012   following spine surgery - right leg  . Headache(784.0)   . PONV (postoperative nausea and vomiting)   . TMJ (dislocation of temporomandibular joint)     Past Surgical History:  Procedure Laterality Date  . ANTERIOR CERVICAL DECOMP/DISCECTOMY FUSION  05/05/2012   Procedure: ANTERIOR CERVICAL DECOMPRESSION/DISCECTOMY FUSION 1 LEVEL;  Surgeon: Elaina Hoops, MD;  Location: [place] NEURO ORS;  Service: Neurosurgery;  Laterality: N/A;  Cervical four-five anterior cervical decompression with fusion interbody prothesis plating and bonegraft  . ANTERIOR CERVICAL DECOMP/DISCECTOMY FUSION N/A 05/03/2014   Procedure: CERVICAL FIVE TO SIX, CERVICAL SIX TO SEVEN ANTERIOR CERVICAL DECOMPRESSION/DISCECTOMY FUSION 2 LEVELS;  Surgeon: Elaina Hoops, MD;  Location: [place] NEURO ORS;  Service: Neurosurgery;  Laterality: N/A;  . CESAREAN SECTION    . CHOLECYSTECTOMY    . episiotomy   11/19/97  . episiotomy  repair  11/1997  . EYE SURGERY    . LEFT HEART CATH AND CORONARY ANGIOGRAPHY N/A 09/26/2017   Procedure: LEFT HEART CATH AND CORONARY ANGIOGRAPHY;  Surgeon: Belva Crome, MD;  Location: [place] CV LAB;  Service: Cardiovascular;  Laterality: N/A;    Current Medications: Current Meds  Medication Sig  . ACZONE 5 % topical gel APPLY TOPICALLY 2 TIMES DAILY. (Patient taking differently: APPLY TOPICALLY ONCE TO TWICE  DAILY.)  . Adapalene-Benzoyl Peroxide (EPIDUO) 0.1-2.5 % gel Apply 1 application topically at bedtime. Apply to face  . aspirin EC 81 MG tablet Take 81 mg daily by mouth.  Marland Kitchen aspirin-acetaminophen-caffeine (EXCEDRIN MIGRAINE) 250-250-65 MG per tablet Take 1 tablet by mouth daily as needed for headache.   . B Complex-C (B-COMPLEX WITH VITAMIN C) tablet Take 1 tablet by mouth daily.  Marland Kitchen BIOTIN PO Take 1 tablet by mouth daily.  . Calcium Carbonate-Vitamin D (CALCIUM 600+D) 600-400 MG-UNIT per tablet Take 2 tablets by mouth at bedtime.   . carvedilol (COREG) 3.125 MG tablet Take 1 tablet (3.125 mg total) 2 (two) times daily by mouth.  . Cholecalciferol (VITAMIN D) 2000 units CAPS Take 2,000 Units by mouth daily.   Marland Kitchen doxycycline (PERIOSTAT) 20 MG tablet Take 1 tablet (20 mg total) by mouth 2 (two) times daily.  Marland Kitchen losartan (COZAAR) 25 MG tablet Take 1 tablet (25 mg total) daily by mouth.  . magnesium oxide (MAG-OX) 400 MG tablet Take 400 mg by mouth at bedtime.   . methocarbamol (ROBAXIN) 500 MG tablet Take 1 tablet (500 mg  total) by mouth every 6 (six) hours as needed for muscle spasms.  . mometasone (NASONEX) 50 MCG/ACT nasal spray Place 2 sprays into the nose daily.  . Multiple Vitamin (MULTIVITAMIN WITH MINERALS) TABS Take 1 tablet by mouth daily. Centrum  . NONFORMULARY OR COMPOUNDED ITEM Apply 1 g topically 3 (three) times daily as needed for pain. C ANTI INFLAM W  KETAMINE  . Omega-3 Fatty Acids (FISH OIL) 1200 MG CAPS Take 1,200 mg by mouth daily.   Marland Kitchen PATADAY 0.2 % SOLN PLACE 1  DROP IN EACH EYE AS NEEDED FOR DRY/ITCHY EYES OR ALLERGIES  . Probiotic Product (PROBIOTIC PO) Take 1 capsule by mouth daily.     Allergies:   Other; Prenatal vitamins; and Septra [bactrim]   Social History   Socioeconomic History  . Marital status: Married    Spouse name: None  . Number of children: None  . Years of education: None  . Highest education level: None  Social Needs  . Financial resource strain: None  . Food insecurity - worry: None  . Food insecurity - inability: None  . Transportation needs - medical: None  . Transportation needs - non-medical: None  Occupational History  . Occupation: Programmer, multimedia: [employer]  Tobacco Use  . Smoking status: Never Smoker  . Smokeless tobacco: Never Used  Substance and Sexual Activity  . Alcohol use: Yes    Comment: socially  . Drug use: No  . Sexual activity: Yes  Other Topics Concern  . None  Social History Narrative   Married. Education: [employer]. Exercise: Aerobics 3 days a week for 40 minutes.     Family History: The patient's family history includes Cancer in her maternal grandfather and mother; Diabetes in her maternal grandmother; Heart disease in her paternal grandmother; Hyperlipidemia in her paternal grandmother; Hypertension in her father and paternal grandmother; Hypothyroidism in her father; Stroke in her maternal grandmother and paternal grandfather. There is no history of Osteoporosis. ROS:   Please see the history of present illness.    All 14 point review of systems negative except as described per history of present illness  EKGs/Labs/Other Studies Reviewed:      Recent Labs: 08/19/2017: ALT 14 09/16/2017: BUN 15; Creatinine, Ser 0.63; Hemoglobin 14.5; Platelets 253; Potassium 4.8; Sodium 138; TSH 3.610  Recent Lipid Panel    Component Value Date/Time   CHOL 178 08/19/2017 1255   TRIG 57.0 08/19/2017 1255   HDL 89.10 08/19/2017 1255   CHOLHDL 2 08/19/2017 1255   VLDL 11.4 08/19/2017 1255    LDLCALC 77 08/19/2017 1255    Physical Exam:    VS:  BP 106/70   Pulse 67   Ht 5' 0.5" (1.537 m)   Wt 134 lb 6.4 oz (61 kg)   LMP 09/22/2017   SpO2 98%   BMI 25.82 kg/m     Wt Readings from Last 3 Encounters:  10/10/17 134 lb 6.4 oz (61 kg)  09/26/17 132 lb (59.9 kg)  09/16/17 132 lb (59.9 kg)     GEN:  Well nourished, well developed in no acute distress HEENT: Normal NECK: No JVD; No carotid bruits LYMPHATICS: No lymphadenopathy CARDIAC: RRR, no murmurs, no rubs, no gallops RESPIRATORY:  Clear to auscultation without rales, wheezing or rhonchi  ABDOMEN: Soft, non-tender, non-distended MUSCULOSKELETAL:  No edema; No deformity  SKIN: Warm and dry LOWER EXTREMITIES: no swelling NEUROLOGIC:  Alert and oriented x 3 PSYCHIATRIC:  Normal affect   ASSESSMENT:    1.  Atypical chest pain   2. Dilated cardiomyopathy ([place])   3. Attention deficit hyperactivity disorder (ADHD), unspecified ADHD type    PLAN:    In order of problems listed above:  1. Cardiomyopathy with normal coronaries.  She is on blocker as well as ARB I will ask you to increase dose of losartan to 50 mg daily we will check Chem-7 continue small dose of beta-blocker. 2. Atypical chest pain: Gone denies having any symptoms. 3. ADHD: Adderall do withdrawn we will continue monitoring.  Overall she does have mild dilated cardiomyopathy cardiac catheterization was normal will continue optimal medical therapy.   Medication Adjustments/Labs and Tests Ordered: Current medicines are reviewed at length with the patient today.  Concerns regarding medicines are outlined above.  No orders of the defined types were placed in this encounter.  Medication changes: No orders of the defined types were placed in this encounter.   Signed, Park Liter, MD, [facility] 10/10/2017 2:22 PM    [facility]

## 2017-10-10 NOTE — Patient Instructions (Signed)
Medication Instructions:  Your physician has recommended you make the following change in your medication:  1) Increase Losartan to 50 mg daily  Labwork: Your physician recommends that you return for lab work in: 1 week for a BMP  Testing/Procedures: None ordered  Follow-Up: Your physician recommends that you schedule a follow-up appointment in: 3 months with Dr. Agustin Cree   Any Other Special Instructions Will Be Listed Below (If Applicable).     If you need a refill on your cardiac medications before your next appointment, please call your pharmacy.

## 2017-10-10 NOTE — Addendum Note (Signed)
Addended by: Aleatha Borer on: 10/10/2017 02:28 PM   Modules accepted: Orders

## 2017-10-14 ENCOUNTER — Other Ambulatory Visit (INDEPENDENT_AMBULATORY_CARE_PROVIDER_SITE_OTHER): Payer: 59

## 2017-10-14 DIAGNOSIS — M8580 Other specified disorders of bone density and structure, unspecified site: Secondary | ICD-10-CM

## 2017-10-14 LAB — BASIC METABOLIC PANEL
BUN: 18 mg/dL (ref 6–23)
CHLORIDE: 103 meq/L (ref 96–112)
CO2: 29 meq/L (ref 19–32)
CREATININE: 0.65 mg/dL (ref 0.40–1.20)
Calcium: 9 mg/dL (ref 8.4–10.5)
GFR: 102.83 mL/min (ref >60.00)
GLUCOSE: 91 mg/dL (ref 70–99)
Potassium: 4.4 mEq/L (ref 3.5–5.1)
Sodium: 138 mEq/L (ref 135–145)

## 2017-10-14 MED FILL — CARVEDILOL 3.125 MG TABLET: 3.125 | 30 days supply | Qty: 60 | Fill #1

## 2017-10-16 NOTE — Progress Notes (Signed)
Patient ID: Tiffany Parker, female   DOB: [birth year], 51 y.o.   MRN: [id number]           Chief complaint: Follow-up of low bone mass  Referring physician: Dr. Lorelei Pont  History of Present Illness:  Initial history on first consultation as follows: The patient was referred here for low bone mass by her PCP. She had a screening bone density done because of having a toe fracture and her podiatrist felt that her bones looked osteopenic on regular x-ray. Bone density showed the following Z-scores:  Femoral neck: -2.4  Spine: Normal FRAX fracture risk cannot be calculated for premenopausal women  She has no history of low trauma fracture or height loss.  She had a toe fracture when a heavy television set fell on it  RECENT HISTORY: Menopausal status: She is still premenstrual although skipped 1 cycle Currently has no bone pain, no further fractures  Calcium supplements: Currently taking, usually one a day Vitamin D supplements: 2000 units vitamin D3 in addition to her multivitamin  Lab evaluation: Vitamin D level 29 baseline, follow-up 34  BONE DENSITY: She had a bone density in 08/2017 which show that her Z score was -1.5 at the hip Bone density at the spine was not scored for Z score, measuring 1.14 compared to 1.16 previously    Past Medical History:  Diagnosis Date  . ADHD (attention deficit hyperactivity disorder)   . Allergy   . Arthritis   . Blood transfusion    post delivery  . Blood transfusion without reported diagnosis   . DVT (deep venous thrombosis) ([place]) 04/2012   following spine surgery - right leg  . Headache(784.0)   . PONV (postoperative nausea and vomiting)   . TMJ (dislocation of temporomandibular joint)     Past Surgical History:  Procedure Laterality Date  . ANTERIOR CERVICAL DECOMP/DISCECTOMY FUSION  05/05/2012   Procedure: ANTERIOR CERVICAL DECOMPRESSION/DISCECTOMY FUSION 1 LEVEL;  Surgeon: Elaina Hoops, MD;  Location: [facility];  Service:  Neurosurgery;  Laterality: N/A;  Cervical four-five anterior cervical decompression with fusion interbody prothesis plating and bonegraft  . ANTERIOR CERVICAL DECOMP/DISCECTOMY FUSION N/A 05/03/2014   Procedure: CERVICAL FIVE TO SIX, CERVICAL SIX TO SEVEN ANTERIOR CERVICAL DECOMPRESSION/DISCECTOMY FUSION 2 LEVELS;  Surgeon: Elaina Hoops, MD;  Location: [facility];  Service: Neurosurgery;  Laterality: N/A;  . CESAREAN SECTION    . CHOLECYSTECTOMY    . episiotomy   11/19/97  . episiotomy repair  11/1997  . EYE SURGERY    . LEFT HEART CATH AND CORONARY ANGIOGRAPHY N/A 09/26/2017   Procedure: LEFT HEART CATH AND CORONARY ANGIOGRAPHY;  Surgeon: Belva Crome, MD;  Location: [facility];  Service: Cardiovascular;  Laterality: N/A;    Family History  Problem Relation Age of Onset  . Cancer Mother   . Hypertension Father   . Hypothyroidism Father   . Diabetes Maternal Grandmother   . Stroke Maternal Grandmother   . Cancer Maternal Grandfather        lung  . Hypertension Paternal Grandmother   . Hyperlipidemia Paternal Grandmother   . Heart disease Paternal Grandmother   . Stroke Paternal Grandfather   . Osteoporosis Neg Hx     Social History:  reports that  has never smoked. she has never used smokeless tobacco. She reports that she drinks alcohol. She reports that she does not use drugs.  Allergies:  Allergies  Allergen Reactions  . Other Other (See Comments)  Anesthesia used in July 2013 caused nausea and vomiting  . Prenatal Vitamins Hives    Patient does not remember the brand of the prenatal vitamin that caused her to have issues.   . Septra [Bactrim] Hives    Allergies as of 10/17/2017      Reactions   Other Other (See Comments)   Anesthesia used in July 2013 caused nausea and vomiting   Prenatal Vitamins Hives   Patient does not remember the brand of the prenatal vitamin that caused her to have issues.    Septra [bactrim] Hives      Medication List         Accurate as of 10/17/17  9:24 AM. Always use your most recent med list.          ACZONE 5 % topical gel Generic drug:  Dapsone APPLY TOPICALLY 2 TIMES DAILY.   Adapalene-Benzoyl Peroxide 0.1-2.5 % gel Commonly known as:  EPIDUO Apply 1 application topically at bedtime. Apply to face   aspirin EC 81 MG tablet Take 81 mg daily by mouth.   aspirin-acetaminophen-caffeine 250-250-65 MG tablet Commonly known as:  EXCEDRIN MIGRAINE Take 1 tablet by mouth daily as needed for headache.   B-complex with vitamin C tablet Take 1 tablet by mouth daily.   BIOTIN PO Take 1 tablet by mouth daily.   CALCIUM 600+D 600-400 MG-UNIT tablet Generic drug:  Calcium Carbonate-Vitamin D Take 2 tablets by mouth at bedtime.   carvedilol 3.125 MG tablet Commonly known as:  COREG Take 1 tablet (3.125 mg total) 2 (two) times daily by mouth.   doxycycline 20 MG tablet Commonly known as:  PERIOSTAT Take 1 tablet (20 mg total) by mouth 2 (two) times daily.   Fish Oil 1200 MG Caps Take 1,200 mg by mouth daily.   losartan 50 MG tablet Commonly known as:  COZAAR Take 1 tablet (50 mg total) by mouth daily.   magnesium oxide 400 MG tablet Commonly known as:  MAG-OX Take 400 mg by mouth at bedtime.   methocarbamol 500 MG tablet Commonly known as:  ROBAXIN Take 1 tablet (500 mg total) by mouth every 6 (six) hours as needed for muscle spasms.   mometasone 50 MCG/ACT nasal spray Commonly known as:  NASONEX Place 2 sprays into the nose daily.   multivitamin with minerals Tabs tablet Take 1 tablet by mouth daily. Centrum   NONFORMULARY OR COMPOUNDED ITEM Apply 1 g topically 3 (three) times daily as needed for pain. C ANTI INFLAM W  KETAMINE   PATADAY 0.2 % Soln Generic drug:  Olopatadine HCl PLACE 1 DROP IN EACH EYE AS NEEDED FOR DRY/ITCHY EYES OR ALLERGIES   PROBIOTIC PO Take 1 capsule by mouth daily.   Vitamin D 2000 units Caps Take 2,000 Units by mouth daily.        Review of  Systems   LABS:   Lab on 10/14/2017  Component Date Value Ref Range Status  . Sodium 10/14/2017 138  135 - 145 mEq/L Final  . Potassium 10/14/2017 4.4  3.5 - 5.1 mEq/L Final  . Chloride 10/14/2017 103  96 - 112 mEq/L Final  . CO2 10/14/2017 29  19 - 32 mEq/L Final  . Glucose, Bld 10/14/2017 91  70 - 99 mg/dL Final  . BUN 10/14/2017 18  6 - 23 mg/dL Final  . Creatinine, Ser 10/14/2017 0.65  0.40 - 1.20 mg/dL Final  . Calcium 10/14/2017 9.0  8.4 - 10.5 mg/dL Final  . GFR 10/14/2017 102.83  >  60.00 mL/min Final     PHYSICAL EXAM:  BP 102/72   Pulse 76   Ht 5' 0.05" (1.525 m)   Wt 133 lb (60.3 kg)   LMP 09/22/2017   SpO2 97%   BMI 25.93 kg/m     ASSESSMENT:   LOW BONE MASS:  She  Had an asymptomatic slightly low bone mass with Z score of -2.4 at baseline in 2016 Previously had a toe fracture but this is related to trauma  More recent bone density shows Z score in the normal range of -1.5 at the hip  Has not had any clinical problems such as fractures since her initial evaluation She is taking her vitamin D supplement and this was previously increased up to 2000 units a day Also taking calcium supplements Currently not exercising because of her cardiomyopathy but is planning to start back     PLAN:   Recommend repeat bone density in 2 years again Continue calcium and vitamin D Follow-up in 2 years    There are no Patient Instructions on file for this visit.   Elayne Snare 10/17/2017, 9:24 AM    Addendum: Labs normal  Lab Results  Component Value Date   TSH 3.610 09/16/2017

## 2017-10-17 ENCOUNTER — Ambulatory Visit: Payer: 59 | Admitting: Endocrinology

## 2017-10-17 ENCOUNTER — Encounter: Payer: Self-pay | Admitting: Endocrinology

## 2017-10-17 VITALS — BP 102/72 | HR 76 | Ht 60.05 in | Wt 133.0 lb

## 2017-10-17 DIAGNOSIS — M8580 Other specified disorders of bone density and structure, unspecified site: Secondary | ICD-10-CM | POA: Diagnosis not present

## 2017-10-17 DIAGNOSIS — E559 Vitamin D deficiency, unspecified: Secondary | ICD-10-CM

## 2017-10-19 IMAGING — MR MR CERVICAL SPINE W/O CM
4 of 5 series · 19 of 48 positions shown · non-contrast
Comparison: 02/21/2013

CLINICAL DATA: Left shoulder and arm pain with numbness in the
third through fifth digits of the left hand for 4-5 months. Prior
cervical fusion.

EXAM:
MRI CERVICAL SPINE WITHOUT CONTRAST
TECHNIQUE: Multiplanar, multisequence MR imaging of the cervical spine was
performed. No intravenous contrast was administered.

[Series 3: T2 · sagittal · 3.0mm · 0.43mm/px · 6 of 15 slices shown (1 of 2)]
[im 1/15]
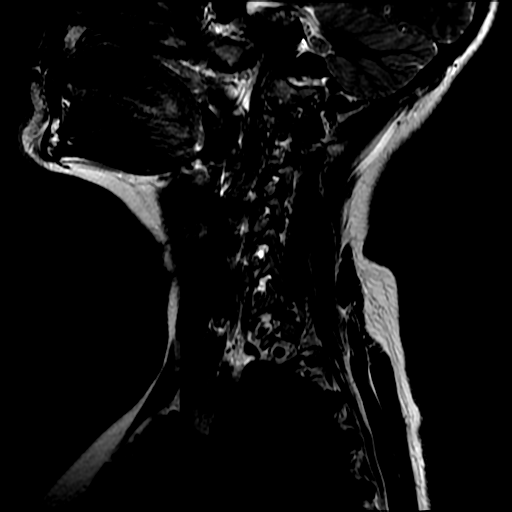
[im 3/15]
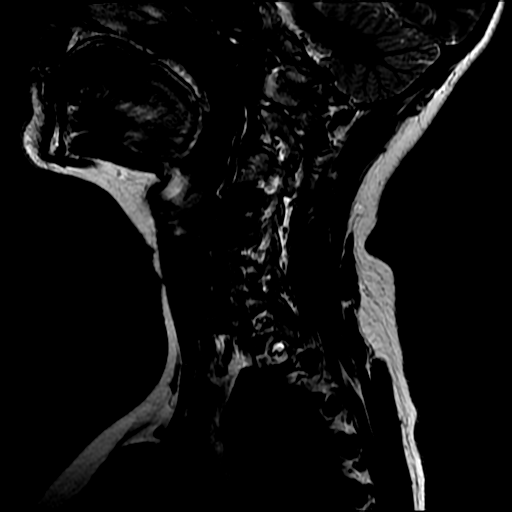
[im 6/15]
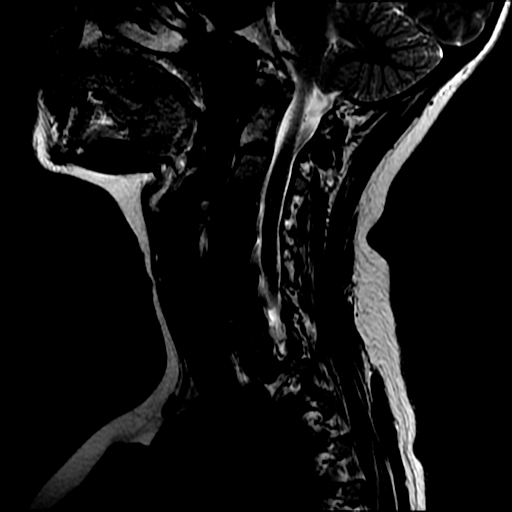
[im 9/15]
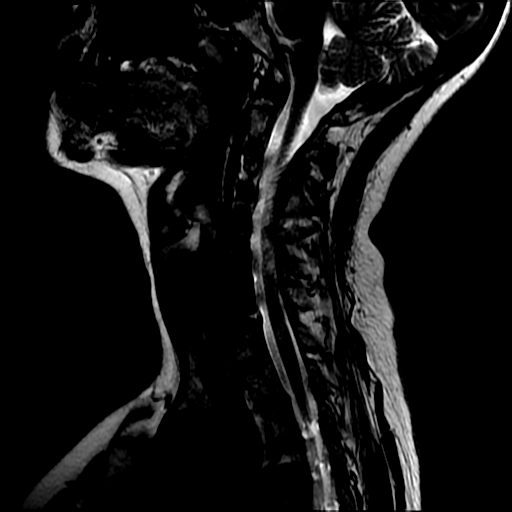
[im 12/15]
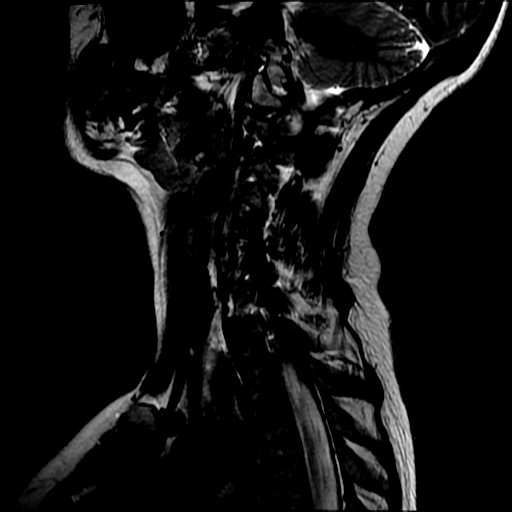
[im 15/15]
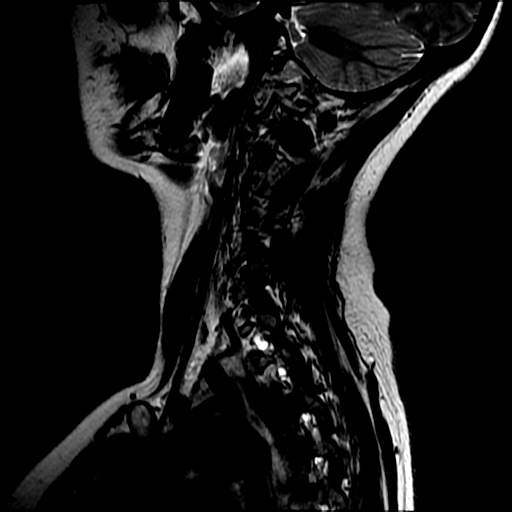

[Series 4: FLAIR · sagittal · 3.0mm · 0.43mm/px · 3 of 15 slices shown]
[im 3/15]
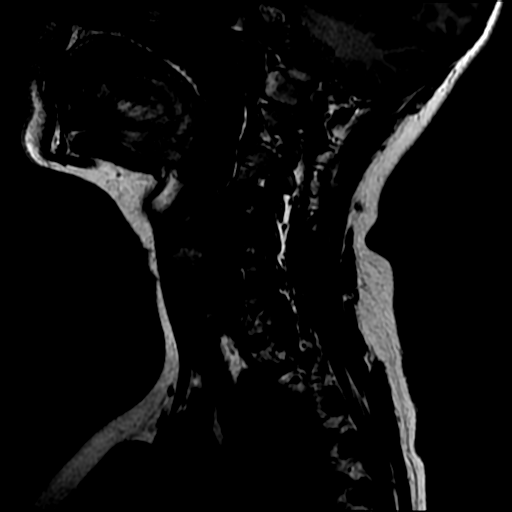
[im 9/15]
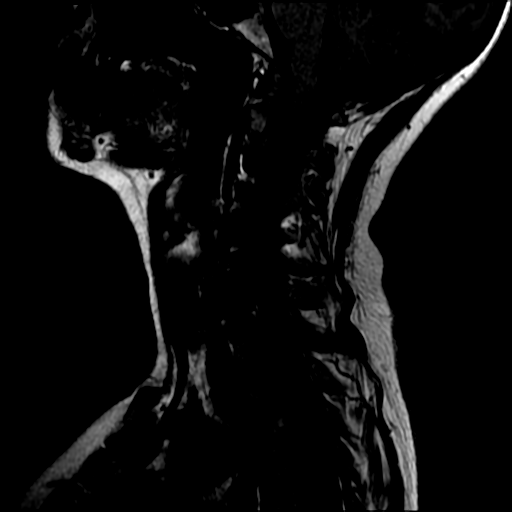
[im 15/15]
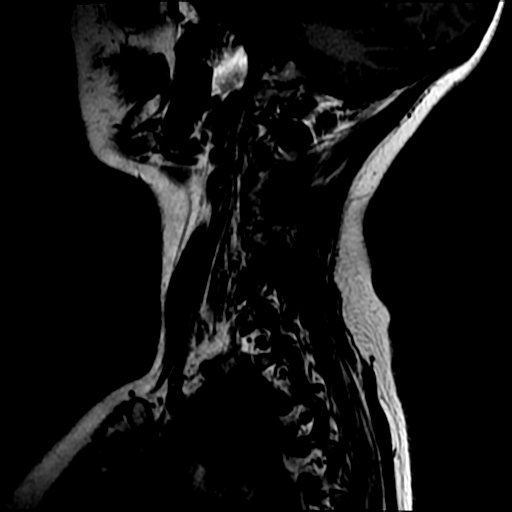

[Series 5: STIR · sagittal · 3.0mm · 0.43mm/px · 3 of 15 slices shown]
[im 3/15]
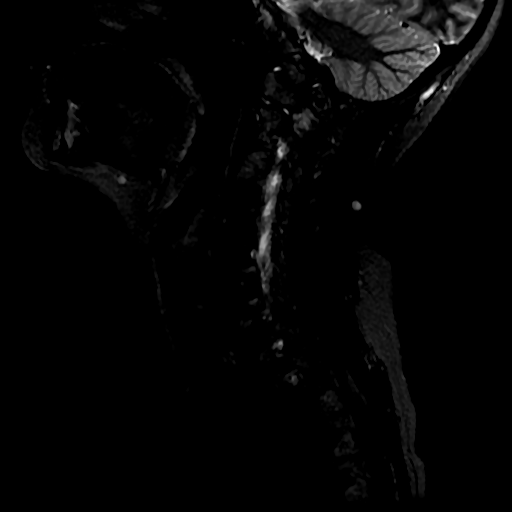
[im 9/15]
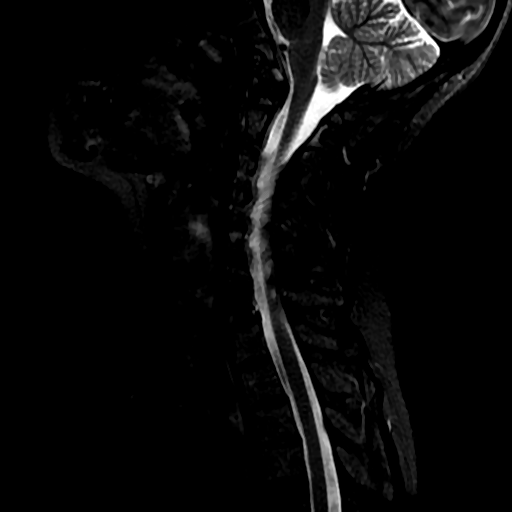
[im 15/15]
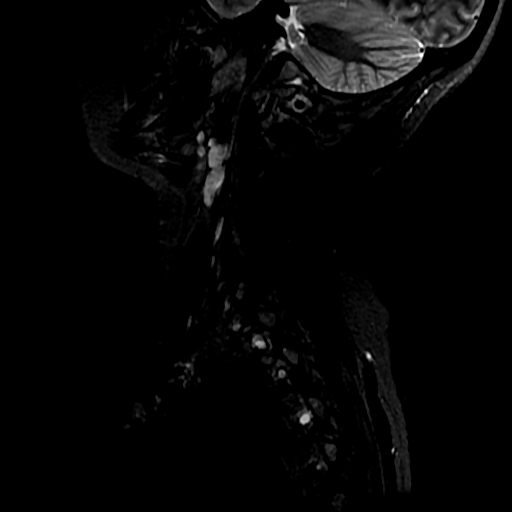

[Series 6: T2 · axial · 3.0mm · 0.35mm/px · z∈[+5,+99]mm · 7 of 36 slices shown (2 of 2)]
[im 1/36]
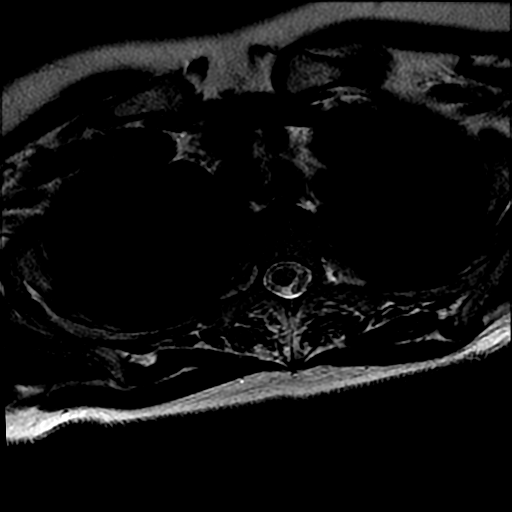
[im 6/36]
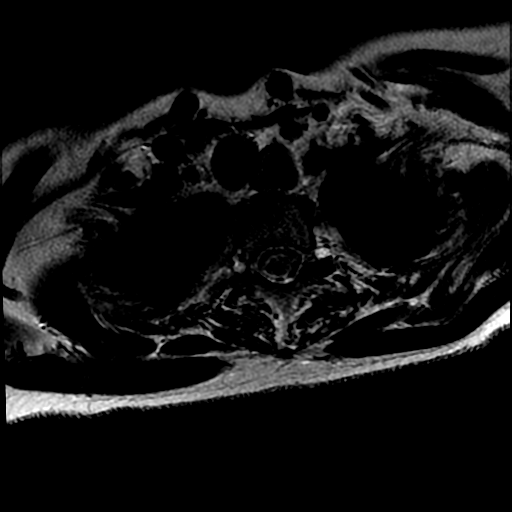
[im 11/36]
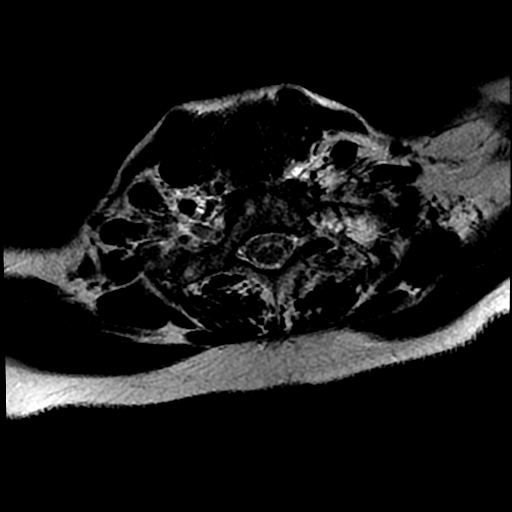
[im 17/36]
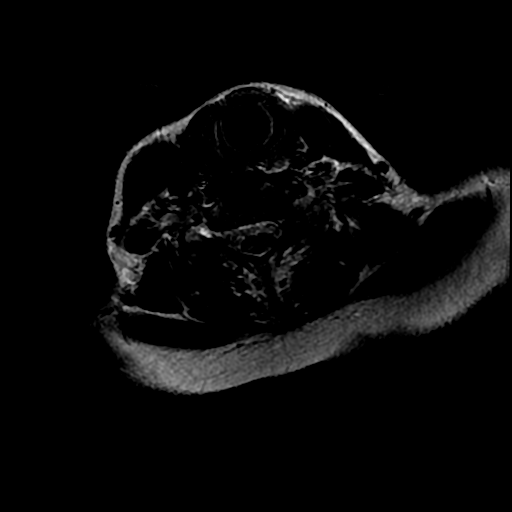
[im 19/36]
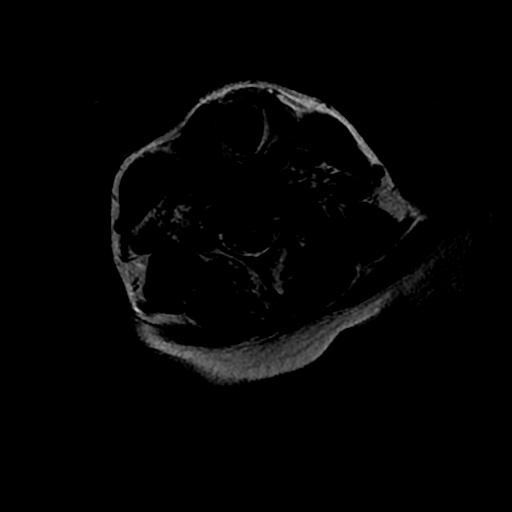
[im 25/36]
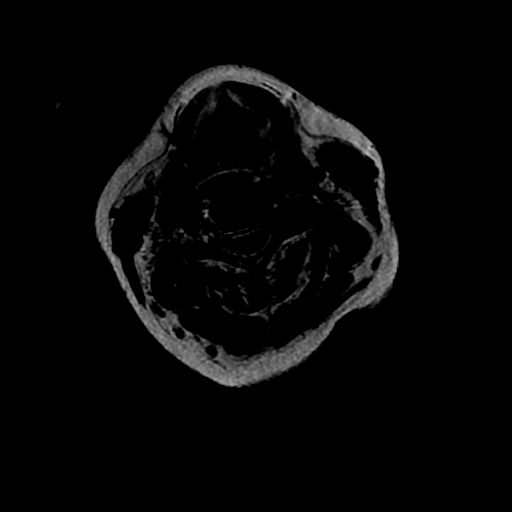
[im 30/36]
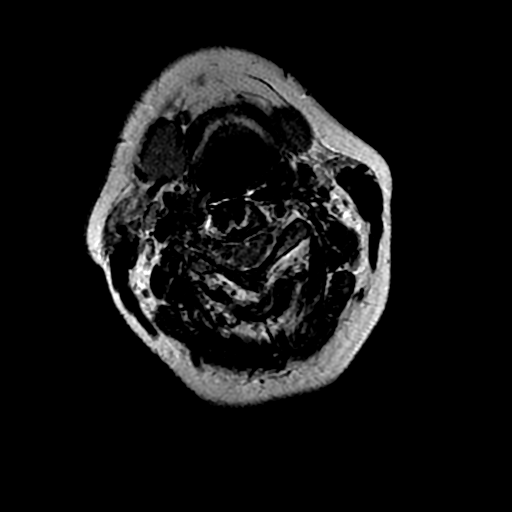

[19 of 48 positions shown; findings below may reference images not displayed]

FINDINGS: Alignment: Slight right convex curvature of the cervical spine. No
listhesis.

Vertebrae: No fracture or osseous lesion identified. Prior C4-5 ACDF
with interval ACDF at C5-6 and C6-7.

Cord: Normal signal and morphology.

Posterior Fossa, vertebral arteries, paraspinal tissues: 7 mm cyst
in the left vallecula. Preserved vertebral artery flow voids.

Disc levels:

C2-3:  Negative.

C3-4: Partial ankylosis between the vertebral bodies and posterior
elements, likely congenital. No disc herniation or stenosis.

C4-5:  Prior ACDF.  No stenosis.

C5-6:  Interval ACDF.  No stenosis.

C6-7: Interval ACDF. Widely patent spinal canal. Uncovertebral
spurring results in mild residual neural foraminal narrowing, left
greater than right.

C7-T1:  New minimal disc bulging without stenosis.
IMPRESSION: 1. Interval C5-C7 ACDF. Mild residual neural foraminal narrowing at
C6-7. Widely patent spinal canal.
2. Minimal disc bulging at C7-T1 without stenosis.

## 2017-10-24 MED FILL — TRETINOIN 0.05% CREAM: 0.05 | 30 days supply | Qty: 45 | Fill #0

## 2017-10-30 DIAGNOSIS — Z1231 Encounter for screening mammogram for malignant neoplasm of breast: Secondary | ICD-10-CM | POA: Diagnosis not present

## 2017-11-06 ENCOUNTER — Encounter (INDEPENDENT_AMBULATORY_CARE_PROVIDER_SITE_OTHER): Payer: Self-pay

## 2017-11-07 MED FILL — LOSARTAN POTASSIUM 50 MG TA: 50 | 90 days supply | Qty: 90 | Fill #1

## 2017-11-07 MED FILL — DOXYCYCLINE HYC 20 MG TAB: 20 | 30 days supply | Qty: 60 | Fill #2

## 2017-11-07 MED FILL — CARVEDILOL 3.125 MG TABLET: 3.125 | 90 days supply | Qty: 180 | Fill #2

## 2017-11-13 DIAGNOSIS — I429 Cardiomyopathy, unspecified: Secondary | ICD-10-CM | POA: Diagnosis not present

## 2017-11-13 DIAGNOSIS — Z803 Family history of malignant neoplasm of breast: Secondary | ICD-10-CM | POA: Diagnosis not present

## 2017-11-13 DIAGNOSIS — Z01419 Encounter for gynecological examination (general) (routine) without abnormal findings: Secondary | ICD-10-CM | POA: Diagnosis not present

## 2017-11-13 DIAGNOSIS — Z1151 Encounter for screening for human papillomavirus (HPV): Secondary | ICD-10-CM | POA: Diagnosis not present

## 2017-11-13 DIAGNOSIS — Z86718 Personal history of other venous thrombosis and embolism: Secondary | ICD-10-CM | POA: Diagnosis not present

## 2017-11-14 ENCOUNTER — Encounter: Payer: Self-pay | Admitting: Family Medicine

## 2017-11-25 DIAGNOSIS — L700 Acne vulgaris: Secondary | ICD-10-CM | POA: Diagnosis not present

## 2017-12-04 DIAGNOSIS — N9089 Other specified noninflammatory disorders of vulva and perineum: Secondary | ICD-10-CM | POA: Diagnosis not present

## 2017-12-04 DIAGNOSIS — D280 Benign neoplasm of vulva: Secondary | ICD-10-CM | POA: Diagnosis not present

## 2017-12-04 DIAGNOSIS — Z3202 Encounter for pregnancy test, result negative: Secondary | ICD-10-CM | POA: Diagnosis not present

## 2017-12-04 DIAGNOSIS — R87810 Cervical high risk human papillomavirus (HPV) DNA test positive: Secondary | ICD-10-CM | POA: Diagnosis not present

## 2017-12-11 MED FILL — DOXYCYCLINE HYC 20 MG TAB: 20 | 90 days supply | Qty: 180 | Fill #3

## 2017-12-18 ENCOUNTER — Encounter: Payer: Self-pay | Admitting: Family Medicine

## 2018-01-09 ENCOUNTER — Ambulatory Visit (INDEPENDENT_AMBULATORY_CARE_PROVIDER_SITE_OTHER): Payer: 59 | Admitting: Cardiology

## 2018-01-09 ENCOUNTER — Encounter: Payer: Self-pay | Admitting: Cardiology

## 2018-01-09 VITALS — BP 102/68 | HR 82 | Ht 60.0 in | Wt 133.0 lb

## 2018-01-09 DIAGNOSIS — R0789 Other chest pain: Secondary | ICD-10-CM

## 2018-01-09 DIAGNOSIS — I420 Dilated cardiomyopathy: Secondary | ICD-10-CM | POA: Diagnosis not present

## 2018-01-09 DIAGNOSIS — Z9889 Other specified postprocedural states: Secondary | ICD-10-CM | POA: Insufficient documentation

## 2018-01-09 NOTE — Patient Instructions (Signed)
Medication Instructions:  Your physician recommends that you continue on your current medications as directed. Please refer to the Current Medication list given to you today.  Labwork: None ordered  Testing/Procedures: None ordered  Follow-Up: Your physician recommends that you schedule a follow-up appointment in: 5 months with Dr. Krasowski   Any Other Special Instructions Will Be Listed Below (If Applicable).     If you need a refill on your cardiac medications before your next appointment, please call your pharmacy.   

## 2018-01-09 NOTE — Progress Notes (Signed)
Cardiology Office Note:    Date:  01/09/2018   ID:  [name], DOB 1966/05/22, MRN [id number]  PCP:  [name], MD  Cardiologist:  [name], MD    Referring MD: [name], MD   Chief Complaint  Patient presents with  . Follow-up  . Chest Pain  Doing well  History of Present Illness:    Tiffany Parker is a 52 y.o. female with nonischemic cardiomyopathy cardiac catheterization showed normal coronaries.  She is doing great, symptomatic no chest pain tightness squeezing pressure burning chest.  She is frustrated because she started keto diet and she did not lose any weight.  I told her that she need to add exercises to this and also told her about the issues that I do not like about keeping diet like high animal fat content.  She said she will modify this.  We talked also about Mediterranean diet.  She will try to comply with that  Past Medical History:  Diagnosis Date  . ADHD (attention deficit hyperactivity disorder)   . Allergy   . Arthritis   . Blood transfusion    post delivery  . Blood transfusion without reported diagnosis   . DVT (deep venous thrombosis) ([place]) 04/2012   following spine surgery - right leg  . Headache(784.0)   . PONV (postoperative nausea and vomiting)   . TMJ (dislocation of temporomandibular joint)     Past Surgical History:  Procedure Laterality Date  . ANTERIOR CERVICAL DECOMP/DISCECTOMY FUSION  05/05/2012   Procedure: ANTERIOR CERVICAL DECOMPRESSION/DISCECTOMY FUSION 1 LEVEL;  Surgeon: Elaina Hoops, MD;  Location: [facility];  Service: Neurosurgery;  Laterality: N/A;  Cervical four-five anterior cervical decompression with fusion interbody prothesis plating and bonegraft  . ANTERIOR CERVICAL DECOMP/DISCECTOMY FUSION N/A 05/03/2014   Procedure: CERVICAL FIVE TO SIX, CERVICAL SIX TO SEVEN ANTERIOR CERVICAL DECOMPRESSION/DISCECTOMY FUSION 2 LEVELS;  Surgeon: Elaina Hoops, MD;  Location: [place] [facility];  Service: Neurosurgery;   Laterality: N/A;  . CESAREAN SECTION    . CHOLECYSTECTOMY    . episiotomy   11/19/97  . episiotomy repair  11/1997  . EYE SURGERY    . LEFT HEART CATH AND CORONARY ANGIOGRAPHY N/A 09/26/2017   Procedure: LEFT HEART CATH AND CORONARY ANGIOGRAPHY;  Surgeon: Belva Crome, MD;  Location: [facility];  Service: Cardiovascular;  Laterality: N/A;    Current Medications: Current Meds  Medication Sig  . ACZONE 5 % topical gel APPLY TOPICALLY 2 TIMES DAILY. (Patient taking differently: APPLY TOPICALLY ONCE TO TWICE  DAILY.)  . aspirin-acetaminophen-caffeine (EXCEDRIN MIGRAINE) 250-250-65 MG per tablet Take 1 tablet by mouth daily as needed for headache.   . B Complex-C (B-COMPLEX WITH VITAMIN C) tablet Take 1 tablet by mouth daily.  Marland Kitchen BIOTIN PO Take 1 tablet by mouth daily.  . Calcium Carbonate-Vitamin D (CALCIUM 600+D) 600-400 MG-UNIT per tablet Take 2 tablets by mouth at bedtime.   . Cholecalciferol (VITAMIN D) 2000 units CAPS Take 2,000 Units by mouth daily.   Marland Kitchen doxycycline (PERIOSTAT) 20 MG tablet Take 1 tablet (20 mg total) by mouth 2 (two) times daily.  . magnesium oxide (MAG-OX) 400 MG tablet Take 400 mg by mouth at bedtime.   . methocarbamol (ROBAXIN) 500 MG tablet Take 1 tablet (500 mg total) by mouth every 6 (six) hours as needed for muscle spasms.  . mometasone (NASONEX) 50 MCG/ACT nasal spray Place 2 sprays into the nose daily.  . Multiple Vitamin (MULTIVITAMIN  WITH MINERALS) TABS Take 1 tablet by mouth daily. Centrum  . Omega-3 Fatty Acids (FISH OIL) 1200 MG CAPS Take 1,200 mg by mouth daily.   Marland Kitchen PATADAY 0.2 % SOLN PLACE 1 DROP IN EACH EYE AS NEEDED FOR DRY/ITCHY EYES OR ALLERGIES  . Probiotic Product (PROBIOTIC PO) Take 1 capsule by mouth daily.  Marland Kitchen tretinoin (RETIN-A) 0.05 % cream APPLY AT BEDTIME TO FACE     Allergies:   Other; Prenatal vitamins; Septra [bactrim]; and Sulfamethoxazole-trimethoprim   Social History   Socioeconomic History  . Marital status: Married     Spouse name: None  . Number of children: None  . Years of education: None  . Highest education level: None  Social Needs  . Financial resource strain: None  . Food insecurity - worry: None  . Food insecurity - inability: None  . Transportation needs - medical: None  . Transportation needs - non-medical: None  Occupational History  . Occupation: Programmer, multimedia: [employer]  Tobacco Use  . Smoking status: Never Smoker  . Smokeless tobacco: Never Used  Substance and Sexual Activity  . Alcohol use: Yes    Comment: socially  . Drug use: No  . Sexual activity: Yes  Other Topics Concern  . None  Social History Narrative   Married. Education: [employer]. Exercise: Aerobics 3 days a week for 40 minutes.     Family History: The patient's family history includes Cancer in her maternal grandfather and mother; Diabetes in her maternal grandmother; Heart disease in her paternal grandmother; Hyperlipidemia in her paternal grandmother; Hypertension in her father and paternal grandmother; Hypothyroidism in her father; Stroke in her maternal grandmother and paternal grandfather. There is no history of Osteoporosis. ROS:   Please see the history of present illness.    All 14 point review of systems negative except as described per history of present illness  EKGs/Labs/Other Studies Reviewed:      Recent Labs: 08/19/2017: ALT 14 09/16/2017: Hemoglobin 14.5; Platelets 253; TSH 3.610 10/14/2017: BUN 18; Creatinine, Ser 0.65; Potassium 4.4; Sodium 138  Recent Lipid Panel    Component Value Date/Time   CHOL 178 08/19/2017 1255   TRIG 57.0 08/19/2017 1255   HDL 89.10 08/19/2017 1255   CHOLHDL 2 08/19/2017 1255   VLDL 11.4 08/19/2017 1255   LDLCALC 77 08/19/2017 1255    Physical Exam:    VS:  BP 102/68 (BP Location: Right Arm, Patient Position: Sitting, Cuff Size: Normal)   Pulse 82   Ht 5' (1.524 m)   Wt 139 lb 11.2 oz (63.4 kg)   SpO2 98%   BMI 27.28 kg/m     Wt Readings from Last  3 Encounters:  01/09/18 139 lb 11.2 oz (63.4 kg)  10/17/17 133 lb (60.3 kg)  10/10/17 134 lb 6.4 oz (61 kg)     GEN:  Well nourished, well developed in no acute distress HEENT: Normal NECK: No JVD; No carotid bruits LYMPHATICS: No lymphadenopathy CARDIAC: RRR, no murmurs, no rubs, no gallops RESPIRATORY:  Clear to auscultation without rales, wheezing or rhonchi  ABDOMEN: Soft, non-tender, non-distended MUSCULOSKELETAL:  No edema; No deformity  SKIN: Warm and dry LOWER EXTREMITIES: no swelling NEUROLOGIC:  Alert and oriented x 3 PSYCHIATRIC:  Normal affect   ASSESSMENT:    1. Dilated cardiomyopathy ([place])   2. Atypical chest pain    PLAN:    In order of problems listed above:  1. Dilated cardiomyopathy: Ejection fraction 45 by cardiac catheterization on ARB as  well as beta-blocker. 2. Typical chest pain: Denies having any.  Last cardiac catheterization normal coronaries   Medication Adjustments/Labs and Tests Ordered: Current medicines are reviewed at length with the patient today.  Concerns regarding medicines are outlined above.  No orders of the defined types were placed in this encounter.  Medication changes: No orders of the defined types were placed in this encounter.   Signed, Park Liter, MD, [facility] 01/09/2018 2:39 PM    [place]

## 2018-01-12 ENCOUNTER — Encounter (INDEPENDENT_AMBULATORY_CARE_PROVIDER_SITE_OTHER): Payer: Self-pay

## 2018-01-13 NOTE — Telephone Encounter (Signed)
Pt weight was corrected

## 2018-01-19 NOTE — Progress Notes (Signed)
[facility] [address], [place], [place] [address] [phone]  Date:  01/20/2018   Name:  Tiffany Parker   DOB:  02-27-1966   MRN:  [id number]  PCP:  [name], MD    Chief Complaint: ADHD and Tailbone Pain (3 weeks, no known injury, hx of bone spurs)   History of Present Illness:  Tiffany Parker is a 52 y.o. very pleasant female patient who presents with the following:  History of ADHD, DVT after spine surgery, idiopathic cardiomyopathy Here today to follow-up on her medication and also with concern of sacral pain I had seen her back in October when she noted decreased exercise tolerance.  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  She is being maintained on BB and losartan  ADHD: she has been treated with Adderall but I have not filled this for her in a while, we had to stop using it due to cardiomyopathy   NCCSR: last filled on 10/24 No unexpected entries   She and her husband are doing the keto diet for the last 10 weeks Her husband lose 22 lbs- she has lost 2 lbs! She is getting frustrated by her lack of progress She used to exercise daily but has slacked off since her mom got sick a couple of years ago.   She does have dilated cardiomyopathy but does not have any restrictions on her exercise per her report.  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  They are following up  Menses occur every 4-5 weeks still.  Spacing out a bit  Her mother did have cervical cancer years ago  She has noted tailbone pain- feels like she fell and bruised it- but she has not had any injury Notes this for 3 weeks now Most painful to sit She will have tenderness when she needs to have a BM- when the stool is in the vault- but is able to pass it ok No blood in her  stool No bike riding No new exercise  She is off adderall due to her heart issues- she would like to maybe try strattera. However on review this is contraindicated in cardiomyopathy as well Will try wellbutrin instead  No history of seizure disorder  Patient Active Problem List   Diagnosis Date Noted  . Hx of LASIK 01/09/2018  . Dilated cardiomyopathy ([place]) 09/16/2017  . Abnormal stress test 08/28/2017  . Atypical chest pain 08/28/2017  . Low bone mass 07/09/2016  . Vitamin D deficiency 07/09/2016  . Osteopenia determined by x-ray 10/13/2015  . Spinal stenosis of cervical region 05/03/2014  . 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 ADHD (attention deficit hyperactivity disorder) 12/22/2012  . Acne 12/22/2012  . Plantar fasciitis 10/11/2012  . DVT (deep venous thrombosis) ([place]) 05/07/2012  . History of cholecystectomy 01/27/2005  . Cesarean delivery due to previous difficult delivery, delivered, current hospitalization 09/29/2001  . Episiotomy dehiscence 11/27/1997    Past Medical History:  Diagnosis Date  . ADHD (attention deficit hyperactivity disorder)   . Allergy   . Arthritis   . Blood transfusion    post delivery  . Blood transfusion without reported diagnosis   . DVT (deep venous thrombosis) ([place]) 04/2012   following spine surgery -  right leg  . Headache(784.0)   . PONV (postoperative nausea and vomiting)   . TMJ (dislocation of temporomandibular joint)     Past Surgical History:  Procedure Laterality Date  . ANTERIOR CERVICAL DECOMP/DISCECTOMY FUSION  05/05/2012   Procedure: ANTERIOR CERVICAL DECOMPRESSION/DISCECTOMY FUSION 1 LEVEL;  Surgeon: Elaina Hoops, MD;  Location: [facility];  Service: Neurosurgery;  Laterality: N/A;  Cervical four-five anterior cervical decompression with fusion interbody prothesis plating and bonegraft  . ANTERIOR CERVICAL DECOMP/DISCECTOMY FUSION N/A 05/03/2014   Procedure: CERVICAL FIVE TO SIX, CERVICAL SIX TO SEVEN ANTERIOR CERVICAL  DECOMPRESSION/DISCECTOMY FUSION 2 LEVELS;  Surgeon: Elaina Hoops, MD;  Location: [facility];  Service: Neurosurgery;  Laterality: N/A;  . CESAREAN SECTION    . CHOLECYSTECTOMY    . episiotomy   11/19/97  . episiotomy repair  11/1997  . EYE SURGERY    . LEFT HEART CATH AND CORONARY ANGIOGRAPHY N/A 09/26/2017   Procedure: LEFT HEART CATH AND CORONARY ANGIOGRAPHY;  Surgeon: Belva Crome, MD;  Location: [facility];  Service: Cardiovascular;  Laterality: N/A;    Social History   Tobacco Use  . Smoking status: Never Smoker  . Smokeless tobacco: Never Used  Substance Use Topics  . Alcohol use: Yes    Comment: socially  . Drug use: No    Family History  Problem Relation Age of Onset  . Cancer Mother   . Hypertension Father   . Hypothyroidism Father   . Diabetes Maternal Grandmother   . Stroke Maternal Grandmother   . Cancer Maternal Grandfather        lung  . Hypertension Paternal Grandmother   . Hyperlipidemia Paternal Grandmother   . Heart disease Paternal Grandmother   . Stroke Paternal Grandfather   . Osteoporosis Neg Hx     Allergies  Allergen Reactions  . Other Other (See Comments)    Anesthesia used in July 2013 caused nausea and vomiting  . Prenatal Vitamins Hives    Patient does not remember the brand of the prenatal vitamin that caused her to have issues.   . Septra [Bactrim] Hives  . Sulfamethoxazole-Trimethoprim Hives    Medication list has been reviewed and updated.  Current Outpatient Medications on File Prior to Visit  Medication Sig Dispense Refill  . ACZONE 5 % topical gel APPLY TOPICALLY 2 TIMES DAILY. (Patient taking differently: APPLY TOPICALLY ONCE TO TWICE  DAILY.) 180 g 3  . aspirin-acetaminophen-caffeine (EXCEDRIN MIGRAINE) 937-169-67 MG per tablet Take 1 tablet by mouth daily as needed for headache.     . B Complex-C (B-COMPLEX WITH VITAMIN C) tablet Take 1 tablet by mouth daily.    Marland Kitchen BIOTIN PO Take 1 tablet by mouth daily.    . Calcium  Carbonate-Vitamin D (CALCIUM 600+D) 600-400 MG-UNIT per tablet Take 2 tablets by mouth at bedtime.     . Cholecalciferol (VITAMIN D) 2000 units CAPS Take 2,000 Units by mouth daily.     Marland Kitchen doxycycline (PERIOSTAT) 20 MG tablet Take 1 tablet (20 mg total) by mouth 2 (two) times daily. 180 tablet 3  . magnesium oxide (MAG-OX) 400 MG tablet Take 400 mg by mouth at bedtime.     . methocarbamol (ROBAXIN) 500 MG tablet Take 1 tablet (500 mg total) by mouth every 6 (six) hours as needed for muscle spasms. 60 tablet 1  . mometasone (NASONEX) 50 MCG/ACT nasal spray Place 2 sprays into the nose daily. 17 g 2  . Multiple Vitamin (MULTIVITAMIN WITH MINERALS)  TABS Take 1 tablet by mouth daily. Centrum    . Omega-3 Fatty Acids (FISH OIL) 1200 MG CAPS Take 1,200 mg by mouth daily.     Marland Kitchen PATADAY 0.2 % SOLN PLACE 1 DROP IN EACH EYE AS NEEDED FOR DRY/ITCHY EYES OR ALLERGIES 7.5 mL 3  . Probiotic Product (PROBIOTIC PO) Take 1 capsule by mouth daily.    Marland Kitchen tretinoin (RETIN-A) 0.05 % cream APPLY AT BEDTIME TO FACE    . carvedilol (COREG) 3.125 MG tablet Take 1 tablet (3.125 mg total) 2 (two) times daily by mouth. 60 tablet 12  . losartan (COZAAR) 50 MG tablet Take 1 tablet (50 mg total) by mouth daily. 30 tablet 12   No current facility-administered medications on file prior to visit.     Review of Systems:  As per HPI- otherwise negative.    Physical Examination: Vitals:   01/20/18 1000  BP: 112/78  Pulse: 73  Resp: 16  SpO2: 99%   Vitals:   01/20/18 1000  Weight: 133 lb 12.8 oz (60.7 kg)  Height: 5' (1.524 m)   Body mass index is 26.13 kg/m. Ideal Body Weight: Weight in (lb) to have BMI = 25: 127.7  GEN: WDWN, NAD, Non-toxic, A & O x 3, mild overweight, looks well  HEENT: Atraumatic, Normocephalic. Neck supple. No masses, No LAD. Ears and Nose: No external deformity. CV: RRR, No M/G/R. No JVD. No thrill. No extra heart sounds. PULM: CTA B, no wheezes, crackles, rhonchi. No retractions. No resp.  distress. No accessory muscle use. ABD: S, NT, ND EXTR: No c/c/e NEURO Normal gait.  PSYCH: Normally interactive. Conversant. Not depressed or anxious appearing.  Calm demeanor.  No redness or other skin finding in the gluteal cleft to suggest an abscess or pilonidal cyst She is tender over the coccyx however   Assessment and Plan: Coccyxdynia - Plan: DG Sacrum/Coccyx  Attention deficit disorder (ADD) without hyperactivity - Plan: buPROPion (WELLBUTRIN SR) 100 MG 12 hr tablet  Dilated cardiomyopathy ([place])  Here today to discuss a few concerns coccydynia unknown cause.  Will obtain plain films for her Would like to try a course of NSAID but not sure if this is advisable given her cardiomyopathy.  Will touch base with her cardiologist We will try wellbutrin for her ADHD sx.  She will let me know how this works for her  Discussed her difficulty with weight loss.  She is perimenopausal.  Counseled her that weight gain during this phase of life is so common as to be nearly universal.  Loss of muscle mass can be partially to blame so encouraged weight lifting and also just a regular exercise program in general.  Offered reassurance that her weight is still quite reasonable, and encouraged her not to be too critical of herself.  She will try  Signed Lamar Blinks, MD  Message to pt:  Hi Becky- good to see you again.  Here is your x-ray report:  Dg Sacrum/coccyx  Result Date: 01/20/2018 CLINICAL DATA:  Coccygeal pain, no injury EXAM: SACRUM AND COCCYX - 2+ VIEW COMPARISON:  None. FINDINGS: The sacrococcygeal elements are in normal alignment. No fracture is seen. The SI joints appear well corticated, as are the sacral foramina. Pelvic rami are intact. IMPRESSION: Negative. Electronically Signed   By: Ivar Drape M.D.   On: 01/20/2018 14:56   Looks ok.  I have reached out to your cardiologist about using an NSAID for a couple of weeks and will let you know what  I find out. Please let me  know if getting worse or if any change.  If we cannot use NSAIDs, we may consider having you see ortho for a possible injection if your pain continues  Please let me know how the wellbutrin does for you as well

## 2018-01-20 ENCOUNTER — Ambulatory Visit: Payer: 59 | Admitting: Family Medicine

## 2018-01-20 ENCOUNTER — Ambulatory Visit (HOSPITAL_BASED_OUTPATIENT_CLINIC_OR_DEPARTMENT_OTHER)
Admission: RE | Admit: 2018-01-20 | Discharge: 2018-01-20 | Disposition: A | Discharge status: Home / Self Care | Payer: 59 | Source: Ambulatory Visit | Attending: Family Medicine | Admitting: Family Medicine

## 2018-01-20 ENCOUNTER — Encounter: Payer: Self-pay | Admitting: Family Medicine

## 2018-01-20 VITALS — BP 112/78 | HR 73 | Resp 16 | Ht 60.0 in | Wt 133.8 lb

## 2018-01-20 DIAGNOSIS — M533 Sacrococcygeal disorders, not elsewhere classified: Secondary | ICD-10-CM | POA: Insufficient documentation

## 2018-01-20 DIAGNOSIS — I420 Dilated cardiomyopathy: Secondary | ICD-10-CM

## 2018-01-20 DIAGNOSIS — F988 Other specified behavioral and emotional disorders with onset usually occurring in childhood and adolescence: Secondary | ICD-10-CM

## 2018-01-20 MED ORDER — BUPROPION HCL ER (SR) 100 MG PO TB12: 100.0000 mg | ORAL_TABLET | Freq: Two times a day (BID) | ORAL | 11 refills | Status: DC

## 2018-01-20 MED FILL — BUPROPION HCL SR 100 MG TAB: 100 | 30 days supply | Qty: 60 | Fill #0

## 2018-01-20 NOTE — Patient Instructions (Addendum)
Good to see you again today!  We will try wellbutrin for your ADHD symptoms. Please let me know how this works for you.  Take 1 pill am for one week, then go to twice a day I will ask your cardiologist about using an NSAID for your tailbone pain.  Will be in touch with your x-ray reports as well  I would recommend that you try adding in some exercise and also weight training to help maintain muscle mass and keep a stable weight Take care!

## 2018-01-23 ENCOUNTER — Encounter: Payer: Self-pay | Admitting: Family Medicine

## 2018-01-23 ENCOUNTER — Other Ambulatory Visit: Payer: Self-pay | Admitting: Family Medicine

## 2018-01-23 DIAGNOSIS — M533 Sacrococcygeal disorders, not elsewhere classified: Secondary | ICD-10-CM

## 2018-01-23 MED ORDER — MELOXICAM 7.5 MG PO TABS
7.5000 mg | ORAL_TABLET | Freq: Every day | ORAL | 0 refills | Status: DC
Start: 2018-01-23 — End: 2018-06-13

## 2018-01-24 MED FILL — MELOXICAM 7.5 MG TABLET: 7.5 | 30 days supply | Qty: 30 | Fill #0

## 2018-02-06 ENCOUNTER — Other Ambulatory Visit: Payer: Self-pay | Admitting: Family Medicine

## 2018-02-06 MED FILL — LOSARTAN POTASSIUM 50 MG TA: 50 | 90 days supply | Qty: 90 | Fill #2

## 2018-02-06 MED FILL — CARVEDILOL 3.125 MG TABLET: 3.125 | 90 days supply | Qty: 180 | Fill #3

## 2018-02-07 MED FILL — MOMETASONE FUROATE 50 MCG S: 50 | 30 days supply | Qty: 17 | Fill #0

## 2018-02-20 MED FILL — BUPROPION HCL SR 100 MG TAB: 100 | 30 days supply | Qty: 60 | Fill #1

## 2018-03-03 ENCOUNTER — Encounter: Payer: Self-pay | Admitting: Family Medicine

## 2018-03-17 DIAGNOSIS — L738 Other specified follicular disorders: Secondary | ICD-10-CM | POA: Diagnosis not present

## 2018-03-17 DIAGNOSIS — L700 Acne vulgaris: Secondary | ICD-10-CM | POA: Diagnosis not present

## 2018-03-17 DIAGNOSIS — B078 Other viral warts: Secondary | ICD-10-CM | POA: Diagnosis not present

## 2018-03-17 MED FILL — DAPSONE 5% GEL: 5 | 30 days supply | Qty: 90 | Fill #0

## 2018-03-17 MED FILL — DOXYCYCLINE HYC 20 MG TAB: 20 | 90 days supply | Qty: 180 | Fill #4

## 2018-03-26 MED FILL — BUPROPION HCL SR 100 MG TAB: 100 | 30 days supply | Qty: 60 | Fill #2

## 2018-04-10 ENCOUNTER — Encounter: Payer: Self-pay | Admitting: Family Medicine

## 2018-04-21 MED FILL — CARVEDILOL 3.125 MG TABLET: 3.125 | 90 days supply | Qty: 180 | Fill #4

## 2018-04-21 MED FILL — LOSARTAN POTASSIUM 50 MG TA: 50 | 90 days supply | Qty: 90 | Fill #3

## 2018-04-21 MED FILL — BUPROPION HCL SR 100 MG TAB: 100 | 30 days supply | Qty: 60 | Fill #3

## 2018-06-05 DIAGNOSIS — Z1151 Encounter for screening for human papillomavirus (HPV): Secondary | ICD-10-CM | POA: Diagnosis not present

## 2018-06-05 DIAGNOSIS — Z780 Asymptomatic menopausal state: Secondary | ICD-10-CM | POA: Diagnosis not present

## 2018-06-05 DIAGNOSIS — R87810 Cervical high risk human papillomavirus (HPV) DNA test positive: Secondary | ICD-10-CM | POA: Diagnosis not present

## 2018-06-05 DIAGNOSIS — Z01419 Encounter for gynecological examination (general) (routine) without abnormal findings: Secondary | ICD-10-CM | POA: Diagnosis not present

## 2018-06-05 DIAGNOSIS — Z124 Encounter for screening for malignant neoplasm of cervix: Secondary | ICD-10-CM | POA: Diagnosis not present

## 2018-06-13 ENCOUNTER — Ambulatory Visit: Payer: 59 | Admitting: Internal Medicine

## 2018-06-13 ENCOUNTER — Other Ambulatory Visit (INDEPENDENT_AMBULATORY_CARE_PROVIDER_SITE_OTHER): Payer: 59

## 2018-06-13 ENCOUNTER — Encounter: Payer: Self-pay | Admitting: Internal Medicine

## 2018-06-13 ENCOUNTER — Encounter: Payer: Self-pay | Admitting: Family Medicine

## 2018-06-13 DIAGNOSIS — R1030 Lower abdominal pain, unspecified: Secondary | ICD-10-CM

## 2018-06-13 LAB — CBC
HEMATOCRIT: 41 % (ref 36.0–46.0)
HEMOGLOBIN: 13.6 g/dL (ref 12.0–15.0)
MCHC: 33.1 g/dL (ref 30.0–36.0)
MCV: 86.1 fl (ref 78.0–100.0)
PLATELETS: 220 10*3/uL (ref 150.0–400.0)
RBC: 4.76 Mil/uL (ref 3.87–5.11)
RDW: 13.4 % (ref 11.5–15.5)
WBC: 7.2 10*3/uL (ref 4.0–10.5)

## 2018-06-13 LAB — COMPREHENSIVE METABOLIC PANEL
ALT: 13 U/L (ref 0–35)
AST: 14 U/L (ref 0–37)
Albumin: 4.3 g/dL (ref 3.5–5.2)
Alkaline Phosphatase: 80 U/L (ref 39–117)
BILIRUBIN TOTAL: 0.2 mg/dL (ref 0.2–1.2)
BUN: 23 mg/dL (ref 6–23)
CALCIUM: 9.7 mg/dL (ref 8.4–10.5)
CHLORIDE: 103 meq/L (ref 96–112)
CO2: 32 meq/L (ref 19–32)
Creatinine, Ser: 0.78 mg/dL (ref 0.40–1.20)
GFR: 83.1 mL/min (ref >60.00)
Glucose, Bld: 103 mg/dL — ABNORMAL HIGH (ref 70–99)
POTASSIUM: 4.6 meq/L (ref 3.5–5.1)
Sodium: 139 mEq/L (ref 135–145)
Total Protein: 7.4 g/dL (ref 6.0–8.3)

## 2018-06-13 LAB — LIPASE: Lipase: 35 U/L (ref 11.0–59.0)

## 2018-06-13 MED ORDER — AMOXICILLIN-POT CLAVULANATE 875-125 MG PO TABS: 1.0000 | ORAL_TABLET | Freq: Two times a day (BID) | ORAL | 0 refills | Status: DC

## 2018-06-13 MED FILL — AMOX-CLAV 875-125 MG TABLET: 875-125 | 10 days supply | Qty: 20 | Fill #0

## 2018-06-13 NOTE — Progress Notes (Signed)
   Subjective:    Patient ID: Tiffany Parker, female    DOB: 11/26/1965, 52 y.o.   MRN: [id number]  HPI The patient is a 52 YO female coming in for abdominal pain. Started about 4-5 days ago. Originally all over her stomach with bloating. Overall it is mildly improving from pain 6-7/10 now about 5/10. She has taken tylenol and ibuprofen and gas-x with mild relief. She denies fevers or chills. Some change in bowels over the last month to a narrow caliber normal color stool. Denies blood in stool. Denies pain with eating. Denies change in weight.  Review of Systems  Constitutional: Negative.   HENT: Negative.   Eyes: Negative.   Respiratory: Negative for cough, chest tightness and shortness of breath.   Cardiovascular: Negative for chest pain, palpitations and leg swelling.  Gastrointestinal: Positive for abdominal distention and abdominal pain. Negative for anal bleeding, blood in stool, constipation, diarrhea, nausea and vomiting.       Change in stool caliber  Musculoskeletal: Negative.   Skin: Negative.   Neurological: Negative.   Psychiatric/Behavioral: Negative.       Objective:   Physical Exam  Constitutional: She is oriented to person, place, and time. She appears well-developed and well-nourished.  HENT:  Head: Normocephalic and atraumatic.  Eyes: EOM are normal.  Neck: Normal range of motion.  Cardiovascular: Normal rate and regular rhythm.  Pulmonary/Chest: Effort normal and breath sounds normal. No respiratory distress. She has no wheezes. She has no rales.  Abdominal: Soft. Bowel sounds are normal. She exhibits no distension. There is tenderness. There is no rebound.  Diffuse tenderness without rebound or guarding.   Musculoskeletal: She exhibits no edema.  Neurological: She is alert and oriented to person, place, and time. Coordination normal.  Skin: Skin is warm and dry.  Psychiatric: She has a normal mood and affect.   Vitals:   06/13/18 1543  BP: 104/70  Pulse:  78  Temp: 98.4 F (36.9 C)  TempSrc: Oral  SpO2: 99%  Weight: 130 lb (59 kg)  Height: 5' (1.524 m)      Assessment & Plan:

## 2018-06-13 NOTE — Patient Instructions (Addendum)
We will have the augmentin sent in to take 1 pill twice a day for 10 days.   We will check the labs today and have ordered the CT scan of the stomach.   Follow up in 2 weeks with your doctor.

## 2018-06-13 NOTE — Assessment & Plan Note (Addendum)
Checking CBC, CMP, lipase and CT abdomen/pelvis with contrast. Rx for augmentin for likely diverticulitis. She has not had colonoscopy and probably should have in the near future if this is diverticulitis (4-6 weeks after healed) or sooner due to change in caliber if not diverticulitis. Course not consistent with appendicitis and prior gallbladder removal.

## 2018-06-16 ENCOUNTER — Ambulatory Visit (HOSPITAL_COMMUNITY)
Admission: RE | Admit: 2018-06-16 | Discharge: 2018-06-16 | Disposition: A | Discharge status: Home / Self Care | Payer: 59 | Source: Ambulatory Visit | Attending: Internal Medicine | Admitting: Internal Medicine

## 2018-06-16 ENCOUNTER — Encounter (HOSPITAL_COMMUNITY): Payer: Self-pay | Admitting: Radiology

## 2018-06-16 DIAGNOSIS — R150 Incomplete defecation: Secondary | ICD-10-CM | POA: Diagnosis not present

## 2018-06-16 DIAGNOSIS — R109 Unspecified abdominal pain: Secondary | ICD-10-CM | POA: Diagnosis not present

## 2018-06-16 DIAGNOSIS — R1030 Lower abdominal pain, unspecified: Secondary | ICD-10-CM | POA: Insufficient documentation

## 2018-06-16 MED ORDER — IOPAMIDOL (ISOVUE-300) INJECTION 61%
100.0000 mL | Freq: Once | INTRAVENOUS | Status: AC | PRN
  Administered 2018-06-16: 100 mL via INTRAVENOUS

## 2018-06-16 MED ORDER — IOPAMIDOL (ISOVUE-300) INJECTION 61%
INTRAVENOUS | Status: AC
  Filled 2018-06-16: qty 100

## 2018-06-18 ENCOUNTER — Ambulatory Visit (HOSPITAL_COMMUNITY): Payer: 59

## 2018-07-02 MED FILL — MOMETASONE FUROATE 50 MCG S: 50 | 30 days supply | Qty: 17 | Fill #1

## 2018-07-02 MED FILL — DOXYCYCLINE HYC 20 MG TAB: 20 | 30 days supply | Qty: 60 | Fill #5

## 2018-07-24 MED FILL — LOSARTAN POTASSIUM 50 MG TA: 50 | 90 days supply | Qty: 90 | Fill #4

## 2018-07-24 MED FILL — CARVEDILOL 3.125 MG TABLET: 3.125 | 60 days supply | Qty: 120 | Fill #5

## 2018-08-04 ENCOUNTER — Other Ambulatory Visit: Payer: Self-pay | Admitting: Family Medicine

## 2018-08-04 DIAGNOSIS — L719 Rosacea, unspecified: Secondary | ICD-10-CM

## 2018-08-05 MED FILL — DOXYCYCLINE HYC 20 MG TAB: 20 | 90 days supply | Qty: 180 | Fill #0

## 2018-09-03 ENCOUNTER — Ambulatory Visit: Payer: 59 | Admitting: Cardiology

## 2018-09-03 ENCOUNTER — Encounter: Payer: Self-pay | Admitting: Cardiology

## 2018-09-03 VITALS — BP 90/60 | HR 75 | Ht 60.0 in | Wt 135.4 lb

## 2018-09-03 DIAGNOSIS — R0789 Other chest pain: Secondary | ICD-10-CM | POA: Diagnosis not present

## 2018-09-03 DIAGNOSIS — I420 Dilated cardiomyopathy: Secondary | ICD-10-CM

## 2018-09-03 MED ORDER — CARVEDILOL 3.125 MG PO TABS: 3.1250 mg | ORAL_TABLET | Freq: Two times a day (BID) | ORAL | 3 refills | Status: DC

## 2018-09-03 MED ORDER — LOSARTAN POTASSIUM 50 MG PO TABS: 50.0000 mg | ORAL_TABLET | Freq: Every day | ORAL | 3 refills | Status: DC

## 2018-09-03 NOTE — Progress Notes (Signed)
Cardiology Office Note:    Date:  09/03/2018   ID:  Tiffany Parker, DOB [birth year], MRN [id number]  PCP:  [name], MD  Cardiologist:  [name], MD    Referring MD: [name], MD   Chief Complaint  Patient presents with  . Follow-up  Doing well  History of Present Illness:    Tiffany Parker is a 52 y.o. female with history of cardiomyopathy ejection fraction 35 to 40% in October 2018 however in November 2018 improved to 45 to 50% cardiac catheterization was done showing normal coronaries.  She is doing well denies have any chest pain tightness squeezing pressure burning chest no shortness of breath.  She is trying to stick with Mediterranean diet she is also trying to exercise on the regular basis but admits that lately she had no time to do that.  She used to do yoga and I encouraged her to come back to both exercises and yoga practicing.  Past Medical History:  Diagnosis Date  . ADHD (attention deficit hyperactivity disorder)   . Allergy   . Arthritis   . Blood transfusion    post delivery  . Blood transfusion without reported diagnosis   . DVT (deep venous thrombosis) ([place]) 04/2012   following spine surgery - right leg  . Headache(784.0)   . PONV (postoperative nausea and vomiting)   . TMJ (dislocation of temporomandibular joint)     Past Surgical History:  Procedure Laterality Date  . ANTERIOR CERVICAL DECOMP/DISCECTOMY FUSION  05/05/2012   Procedure: ANTERIOR CERVICAL DECOMPRESSION/DISCECTOMY FUSION 1 LEVEL;  Surgeon: Elaina Hoops, MD;  Location: [facility];  Service: Neurosurgery;  Laterality: N/A;  Cervical four-five anterior cervical decompression with fusion interbody prothesis plating and bonegraft  . ANTERIOR CERVICAL DECOMP/DISCECTOMY FUSION N/A 05/03/2014   Procedure: CERVICAL FIVE TO SIX, CERVICAL SIX TO SEVEN ANTERIOR CERVICAL DECOMPRESSION/DISCECTOMY FUSION 2 LEVELS;  Surgeon: Elaina Hoops, MD;  Location: [facility];  Service:  Neurosurgery;  Laterality: N/A;  . CESAREAN SECTION    . CHOLECYSTECTOMY    . episiotomy   11/19/97  . episiotomy repair  11/1997  . EYE SURGERY    . LEFT HEART CATH AND CORONARY ANGIOGRAPHY N/A 09/26/2017   Procedure: LEFT HEART CATH AND CORONARY ANGIOGRAPHY;  Surgeon: Belva Crome, MD;  Location: [facility];  Service: Cardiovascular;  Laterality: N/A;    Current Medications: Current Meds  Medication Sig  . ACZONE 5 % topical gel APPLY TOPICALLY 2 TIMES DAILY. (Patient taking differently: APPLY TOPICALLY ONCE TO TWICE  DAILY.)  . aspirin-acetaminophen-caffeine (EXCEDRIN MIGRAINE) 250-250-65 MG per tablet Take 1 tablet by mouth daily as needed for headache.   . B Complex-C (B-COMPLEX WITH VITAMIN C) tablet Take 1 tablet by mouth daily.  Marland Kitchen BIOTIN PO Take 1 tablet by mouth daily.  . Calcium Carbonate-Vitamin D (CALCIUM 600+D) 600-400 MG-UNIT per tablet Take 2 tablets by mouth at bedtime.   . carvedilol (COREG) 3.125 MG tablet Take 1 tablet (3.125 mg total) 2 (two) times daily by mouth.  . Cholecalciferol (VITAMIN D) 2000 units CAPS Take 2,000 Units by mouth daily.   Marland Kitchen doxycycline (PERIOSTAT) 20 MG tablet TAKE 1 TABLET (20 MG TOTAL) BY MOUTH TWICE A DAY  . losartan (COZAAR) 50 MG tablet Take 1 tablet (50 mg total) by mouth daily.  . magnesium oxide (MAG-OX) 400 MG tablet Take 400 mg by mouth at bedtime.   . methocarbamol (ROBAXIN) 500 MG tablet Take 1 tablet (  500 mg total) by mouth every 6 (six) hours as needed for muscle spasms.  . mometasone (NASONEX) 50 MCG/ACT nasal spray PLACE 2 SPRAYS INTO THE NOSE DAILY.  . Multiple Vitamin (MULTIVITAMIN WITH MINERALS) TABS Take 1 tablet by mouth daily. Centrum  . Omega-3 Fatty Acids (FISH OIL) 1200 MG CAPS Take 1,200 mg by mouth daily.   Marland Kitchen PATADAY 0.2 % SOLN PLACE 1 DROP IN EACH EYE AS NEEDED FOR DRY/ITCHY EYES OR ALLERGIES  . Probiotic Product (PROBIOTIC PO) Take 1 capsule by mouth daily.  Marland Kitchen tretinoin (RETIN-A) 0.05 % cream APPLY AT BEDTIME  TO FACE     Allergies:   Other; Prenatal vitamins; Septra [bactrim]; and Sulfamethoxazole-trimethoprim   Social History   Socioeconomic History  . Marital status: Married    Spouse name: Not on file  . Number of children: Not on file  . Years of education: Not on file  . Highest education level: Not on file  Occupational History  . Occupation: Programmer, multimedia: [employer]  . Financial resource strain: Not on file  . Food insecurity:    Worry: Not on file    Inability: Not on file  . Transportation needs:    Medical: Not on file    Non-medical: Not on file  Tobacco Use  . Smoking status: Never Smoker  . Smokeless tobacco: Never Used  Substance and Sexual Activity  . Alcohol use: Yes    Comment: socially  . Drug use: No  . Sexual activity: Yes  Lifestyle  . Physical activity:    Days per week: Not on file    Minutes per session: Not on file  . Stress: Not on file  Relationships  . Social connections:    Talks on phone: Not on file    Gets together: Not on file    Attends religious service: Not on file    Active member of club or organization: Not on file    Attends meetings of clubs or organizations: Not on file    Relationship status: Not on file  Other Topics Concern  . Not on file  Social History Narrative   Married. Education: [employer]. Exercise: Aerobics 3 days a week for 40 minutes.     Family History: The patient's family history includes Cancer in her maternal grandfather and mother; Diabetes in her maternal grandmother; Heart disease in her paternal grandmother; Hyperlipidemia in her paternal grandmother; Hypertension in her father and paternal grandmother; Hypothyroidism in her father; Stroke in her maternal grandmother and paternal grandfather. There is no history of Osteoporosis. ROS:   Please see the history of present illness.    All 14 point review of systems negative except as described per history of present illness  EKGs/Labs/Other  Studies Reviewed:      Recent Labs: 09/16/2017: TSH 3.610 06/13/2018: ALT 13; BUN 23; Creatinine, Ser 0.78; Hemoglobin 13.6; Platelets 220.0; Potassium 4.6; Sodium 139  Recent Lipid Panel    Component Value Date/Time   CHOL 178 08/19/2017 1255   TRIG 57.0 08/19/2017 1255   HDL 89.10 08/19/2017 1255   CHOLHDL 2 08/19/2017 1255   VLDL 11.4 08/19/2017 1255   LDLCALC 77 08/19/2017 1255    Physical Exam:    VS:  BP 90/60   Pulse 75   Ht 5' (1.524 m)   Wt 135 lb 6.4 oz (61.4 kg)   SpO2 99%   BMI 26.44 kg/m     Wt Readings from Last 3 Encounters:  09/03/18 135 lb 6.4 oz (61.4 kg)  06/13/18 130 lb (59 kg)  01/20/18 133 lb 12.8 oz (60.7 kg)     GEN:  Well nourished, well developed in no acute distress HEENT: Normal NECK: No JVD; No carotid bruits LYMPHATICS: No lymphadenopathy CARDIAC: RRR, no murmurs, no rubs, no gallops RESPIRATORY:  Clear to auscultation without rales, wheezing or rhonchi  ABDOMEN: Soft, non-tender, non-distended MUSCULOSKELETAL:  No edema; No deformity  SKIN: Warm and dry LOWER EXTREMITIES: no swelling NEUROLOGIC:  Alert and oriented x 3 PSYCHIATRIC:  Normal affect   ASSESSMENT:    1. Atypical chest pain   2. Dilated cardiomyopathy ([place])    PLAN:    In order of problems listed above:  1. Atypical chest pain denies having any  2. Dilated cardiomyopathy will repeat her echocardiogram to check left ventricular ejection fraction. 3. Again we continue discussion about healthy lifestyle exercises on the regular basis practicing yoga as well as eating right.   Medication Adjustments/Labs and Tests Ordered: Current medicines are reviewed at length with the patient today.  Concerns regarding medicines are outlined above.  No orders of the defined types were placed in this encounter.  Medication changes: No orders of the defined types were placed in this encounter.   Signed, Park Liter, MD, [facility] 09/03/2018 11:01 AM    [place]

## 2018-09-03 NOTE — Patient Instructions (Addendum)
Medication Instructions:  Your physician recommends that you continue on your current medications as directed. Please refer to the Current Medication list given to you today.  If you need a refill on your cardiac medications before your next appointment, please call your pharmacy.   Lab work: None.   If you have labs (blood work) drawn today and your tests are completely normal, you will receive your results only by: . MyChart Message (if you have MyChart) OR . A paper copy in the mail If you have any lab test that is abnormal or we need to change your treatment, we will call you to review the results.  Testing/Procedures: Your physician has requested that you have an echocardiogram. Echocardiography is a painless test that uses sound waves to create images of your heart. It provides your doctor with information about the size and shape of your heart and how well your heart's chambers and valves are working. This procedure takes approximately one hour. There are no restrictions for this procedure.    Follow-Up: At [facility], you and your health needs are our priority.  As part of our continuing mission to provide you with exceptional heart care, we have created designated Provider Care Teams.  These Care Teams include your primary Cardiologist (physician) and Advanced Practice Providers (APPs -  Physician Assistants and Nurse Practitioners) who all work together to provide you with the care you need, when you need it. You will need a follow up appointment in 6 months.  Please call our office 2 months in advance to schedule this appointment.  You may see No primary care provider on file. or another member of our [facility] Provider Team in [place]: Brian Munley, MD . Rajan Revankar, MD  Any Other Special Instructions Will Be Listed Below (If Applicable).  Echocardiogram An echocardiogram, or echocardiography, uses sound waves (ultrasound) to produce an image of your heart. The  echocardiogram is simple, painless, obtained within a short period of time, and offers valuable information to your health care provider. The images from an echocardiogram can provide information such as:  Evidence of coronary artery disease (CAD).  Heart size.  Heart muscle function.  Heart valve function.  Aneurysm detection.  Evidence of a past heart attack.  Fluid buildup around the heart.  Heart muscle thickening.  Assess heart valve function.  Tell a health care provider about:  Any allergies you have.  All medicines you are taking, including vitamins, herbs, eye drops, creams, and over-the-counter medicines.  Any problems you or family members have had with anesthetic medicines.  Any blood disorders you have.  Any surgeries you have had.  Any medical conditions you have.  Whether you are pregnant or may be pregnant. What happens before the procedure? No special preparation is needed. Eat and drink normally. What happens during the procedure?  In order to produce an image of your heart, gel will be applied to your chest and a wand-like tool (transducer) will be moved over your chest. The gel will help transmit the sound waves from the transducer. The sound waves will harmlessly bounce off your heart to allow the heart images to be captured in real-time motion. These images will then be recorded.  You may need an IV to receive a medicine that improves the quality of the pictures. What happens after the procedure? You may return to your normal schedule including diet, activities, and medicines, unless your health care provider tells you otherwise. This information is not intended to   replace advice given to you by your health care provider. Make sure you discuss any questions you have with your health care provider. Document Released: 10/12/2000 Document Revised: 06/02/2016 Document Reviewed: 06/22/2013 Elsevier Interactive Patient Education  2017 Elsevier  Inc.    

## 2018-09-08 ENCOUNTER — Ambulatory Visit (HOSPITAL_BASED_OUTPATIENT_CLINIC_OR_DEPARTMENT_OTHER)
Admission: RE | Admit: 2018-09-08 | Discharge: 2018-09-08 | Disposition: A | Discharge status: Home / Self Care | Payer: 59 | Source: Ambulatory Visit | Attending: Cardiology | Admitting: Cardiology

## 2018-09-08 DIAGNOSIS — R0789 Other chest pain: Secondary | ICD-10-CM | POA: Insufficient documentation

## 2018-09-08 DIAGNOSIS — I420 Dilated cardiomyopathy: Secondary | ICD-10-CM | POA: Insufficient documentation

## 2018-09-08 NOTE — Progress Notes (Signed)
  Echocardiogram 2D Echocardiogram has been performed.  Tiffany Parker Tiffany Parker 09/08/2018, 10:23 AM

## 2018-09-10 ENCOUNTER — Encounter: Payer: Self-pay | Admitting: Family Medicine

## 2018-09-10 ENCOUNTER — Ambulatory Visit (INDEPENDENT_AMBULATORY_CARE_PROVIDER_SITE_OTHER): Payer: 59 | Admitting: Family Medicine

## 2018-09-10 VITALS — BP 120/82 | HR 61 | Resp 16 | Ht 60.0 in | Wt 135.6 lb

## 2018-09-10 DIAGNOSIS — I420 Dilated cardiomyopathy: Secondary | ICD-10-CM

## 2018-09-10 DIAGNOSIS — Z13220 Encounter for screening for lipoid disorders: Secondary | ICD-10-CM

## 2018-09-10 DIAGNOSIS — Z131 Encounter for screening for diabetes mellitus: Secondary | ICD-10-CM | POA: Diagnosis not present

## 2018-09-10 DIAGNOSIS — Z23 Encounter for immunization: Secondary | ICD-10-CM | POA: Diagnosis not present

## 2018-09-10 DIAGNOSIS — L719 Rosacea, unspecified: Secondary | ICD-10-CM | POA: Diagnosis not present

## 2018-09-10 DIAGNOSIS — Z1211 Encounter for screening for malignant neoplasm of colon: Secondary | ICD-10-CM

## 2018-09-10 DIAGNOSIS — Z0000 Encounter for general adult medical examination without abnormal findings: Secondary | ICD-10-CM | POA: Diagnosis not present

## 2018-09-10 DIAGNOSIS — R5383 Other fatigue: Secondary | ICD-10-CM | POA: Diagnosis not present

## 2018-09-10 LAB — LIPID PANEL
CHOL/HDL RATIO: 3
Cholesterol: 186 mg/dL (ref 0–200)
HDL: 72.2 mg/dL (ref >39.00)
LDL Cholesterol: 101 mg/dL — ABNORMAL HIGH (ref 0–99)
NonHDL: 113.51
TRIGLYCERIDES: 65 mg/dL (ref 0.0–149.0)
VLDL: 13 mg/dL (ref 0.0–40.0)

## 2018-09-10 LAB — TSH: TSH: 2.06 u[IU]/mL (ref 0.35–4.50)

## 2018-09-10 LAB — HEMOGLOBIN A1C: Hgb A1c MFr Bld: 5.4 % (ref 4.6–6.5)

## 2018-09-10 NOTE — Progress Notes (Addendum)
[facility] [address] [place], [place] [address] [phone]  Date:  09/10/2018   Name:  ANTONINETTE Parker   DOB:  Nov 19, 1965   MRN:  [id number]  PCP:  [name], MD    Chief Complaint: Annual Exam   History of Present Illness:  Tiffany Parker is a 52 y.o. very pleasant female patient who presents with the following:  Here today for a CPE History of spinal stenosis s/p operative repair and subsequent DVT in 2013,ADD, dilated cardiomyopathy with EF 35- 40% Recent visit with cardiology, Dr. Raliegh Ip 1. Atypical chest pain denies having any  2. Dilated cardiomyopathy will repeat her echocardiogram to check left ventricular ejection fraction. 3. Again we continue discussion about healthy lifestyle exercises on the regular basis practicing yoga as well as eating right She did do an echo earlier this week, EF a bit better  Her exercise tolerance is about the same No CP  She was seen by Dr. Sharlet Salina in August with abd pain  Pap: pe Dr. Trinna Balloon, she had a positive HPV in January but negative on follow-up Mammo: done at GYN office  Colon:  Not done yet. No family history of colon cancer. Would like to do cologuard  She is fasting today for labs  Flu shot is done She would like to start shingrix  She is on chronic low dose doxycycline   She stopped using wellbutrin- she does not feel like she needs it She is not struggling with depression sx currently  She does feel tired a lot However she does not feel down, no anhedonia, no anxiety   Coreg Losartan Low dose doxy Topicals for her face  Patient Active Problem List   Diagnosis Date Noted  . Lower abdominal pain 06/13/2018  . Hx of LASIK 01/09/2018  . Dilated cardiomyopathy ([place]) 09/16/2017  . Abnormal stress test 08/28/2017  . Atypical chest pain 08/28/2017  . Low bone mass 07/09/2016  . Vitamin D deficiency 07/09/2016  . Osteopenia determined by x-ray  10/13/2015  . Spinal stenosis of cervical region 05/03/2014  . 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 ADHD (attention deficit hyperactivity disorder) 12/22/2012  . Acne 12/22/2012  . Plantar fasciitis 10/11/2012  . DVT (deep venous thrombosis) ([place]) 05/07/2012  . History of cholecystectomy 01/27/2005  . Cesarean delivery due to previous difficult delivery, delivered, current hospitalization 09/29/2001  . Episiotomy dehiscence 11/27/1997    Past Medical History:  Diagnosis Date  . ADHD (attention deficit hyperactivity disorder)   . Allergy   . Arthritis   . Blood transfusion    post delivery  . Blood transfusion without reported diagnosis   . DVT (deep venous thrombosis) ([place]) 04/2012   following spine surgery - right leg  . Headache(784.0)   . PONV (postoperative nausea and vomiting)   . TMJ (dislocation of temporomandibular joint)     Past Surgical History:  Procedure Laterality Date  . ANTERIOR CERVICAL DECOMP/DISCECTOMY FUSION  05/05/2012   Procedure: ANTERIOR CERVICAL DECOMPRESSION/DISCECTOMY FUSION 1 LEVEL;  Surgeon: Elaina Hoops, MD;  Location: [place] [facility];  Service: Neurosurgery;  Laterality: N/A;  Cervical four-five anterior cervical decompression with fusion interbody prothesis plating and bonegraft  . ANTERIOR CERVICAL DECOMP/DISCECTOMY FUSION N/A 05/03/2014   Procedure: CERVICAL FIVE TO SIX, CERVICAL SIX TO SEVEN ANTERIOR CERVICAL DECOMPRESSION/DISCECTOMY FUSION 2 LEVELS;  Surgeon: Elaina Hoops, MD;  Location: [place];  Service: Neurosurgery;  Laterality: N/A;  .  CESAREAN SECTION    . CHOLECYSTECTOMY    . episiotomy   11/19/97  . episiotomy repair  11/1997  . EYE SURGERY    . LEFT HEART CATH AND CORONARY ANGIOGRAPHY N/A 09/26/2017   Procedure: LEFT HEART CATH AND CORONARY ANGIOGRAPHY;  Surgeon: Belva Crome, MD;  Location: [facility];  Service: Cardiovascular;  Laterality: N/A;    Social History   Tobacco Use  . Smoking status: Never Smoker  .  Smokeless tobacco: Never Used  Substance Use Topics  . Alcohol use: Yes    Comment: socially  . Drug use: No    Family History  Problem Relation Age of Onset  . Cancer Mother   . Hypertension Father   . Hypothyroidism Father   . Diabetes Maternal Grandmother   . Stroke Maternal Grandmother   . Cancer Maternal Grandfather        lung  . Hypertension Paternal Grandmother   . Hyperlipidemia Paternal Grandmother   . Heart disease Paternal Grandmother   . Stroke Paternal Grandfather   . Osteoporosis Neg Hx     Allergies  Allergen Reactions  . Other Other (See Comments)    Anesthesia used in July 2013 caused nausea and vomiting  . Prenatal Vitamins Hives    Patient does not remember the brand of the prenatal vitamin that caused her to have issues.   . Septra [Bactrim] Hives  . Sulfamethoxazole-Trimethoprim Hives    Medication list has been reviewed and updated.  Current Outpatient Medications on File Prior to Visit  Medication Sig Dispense Refill  . ACZONE 5 % topical gel APPLY TOPICALLY 2 TIMES DAILY. (Patient taking differently: APPLY TOPICALLY ONCE TO TWICE  DAILY.) 180 g 3  . aspirin-acetaminophen-caffeine (EXCEDRIN MIGRAINE) 740-814-48 MG per tablet Take 1 tablet by mouth daily as needed for headache.     . B Complex-C (B-COMPLEX WITH VITAMIN C) tablet Take 1 tablet by mouth daily.    Marland Kitchen BIOTIN PO Take 1 tablet by mouth daily.    . Calcium Carbonate-Vitamin D (CALCIUM 600+D) 600-400 MG-UNIT per tablet Take 2 tablets by mouth at bedtime.     . carvedilol (COREG) 3.125 MG tablet Take 1 tablet (3.125 mg total) by mouth 2 (two) times daily. 180 tablet 3  . Cholecalciferol (VITAMIN D) 2000 units CAPS Take 2,000 Units by mouth daily.     Marland Kitchen doxycycline (PERIOSTAT) 20 MG tablet TAKE 1 TABLET (20 MG TOTAL) BY MOUTH TWICE A DAY 180 tablet 3  . losartan (COZAAR) 50 MG tablet Take 1 tablet (50 mg total) by mouth daily. 90 tablet 3  . magnesium oxide (MAG-OX) 400 MG tablet Take 400 mg  by mouth at bedtime.     . methocarbamol (ROBAXIN) 500 MG tablet Take 1 tablet (500 mg total) by mouth every 6 (six) hours as needed for muscle spasms. 60 tablet 1  . mometasone (NASONEX) 50 MCG/ACT nasal spray PLACE 2 SPRAYS INTO THE NOSE DAILY. 17 g 2  . Multiple Vitamin (MULTIVITAMIN WITH MINERALS) TABS Take 1 tablet by mouth daily. Centrum    . Omega-3 Fatty Acids (FISH OIL) 1200 MG CAPS Take 1,200 mg by mouth daily.     Marland Kitchen PATADAY 0.2 % SOLN PLACE 1 DROP IN EACH EYE AS NEEDED FOR DRY/ITCHY EYES OR ALLERGIES 7.5 mL 3  . Probiotic Product (PROBIOTIC PO) Take 1 capsule by mouth daily.    Marland Kitchen tretinoin (RETIN-A) 0.05 % cream APPLY AT BEDTIME TO FACE     No current  facility-administered medications on file prior to visit.     Review of Systems:  As per HPI- otherwise negative. No fever or chills No CP  Physical Examination: Vitals:   09/10/18 1059  BP: 120/82  Pulse: 61  Resp: 16  SpO2: 98%   Vitals:   09/10/18 1059  Weight: 135 lb 9.6 oz (61.5 kg)  Height: 5' (1.524 m)   Body mass index is 26.48 kg/m. Ideal Body Weight: Weight in (lb) to have BMI = 25: 127.7 GEN: WDWN, NAD, Non-toxic, A & O x 3 HEENT: Atraumatic, Normocephalic. Neck supple. No masses, No LAD. Ears and Nose: No external deformity. CV: RRR, No M/G/R. No JVD. No thrill. No extra heart sounds. PULM: CTA B, no wheezes, crackles, rhonchi. No retractions. No resp. distress. No accessory muscle use. ABD: S, NT, ND, +BS. No rebound. No HSM. EXTR: No c/c/e NEURO Normal gait.  PSYCH: Normally interactive. Conversant. Not depressed or anxious appearing.  Calm demeanor.    Assessment and Plan: Physical exam  Screening for colon cancer  Screening for diabetes mellitus - Plan: Hemoglobin A1c  Screening for hyperlipidemia - Plan: Lipid panel  Dilated cardiomyopathy (HCC)  Rosacea  Fatigue, unspecified type - Plan: TSH  Immunization due - Plan: Varicella-zoster vaccine IM (Shingrix)  PE today Start on  shingrix series  She sees OBG for her pap and mammo Ordered cologuard for her today   Signed Lamar Blinks, MD  Received her labs, message to pt  Results for orders placed or performed in visit on 09/10/18  Hemoglobin A1c  Result Value Ref Range   Hgb A1c MFr Bld 5.4 4.6 - 6.5 %  Lipid panel  Result Value Ref Range   Cholesterol 186 0 - 200 mg/dL   Triglycerides 65.0 0.0 - 149.0 mg/dL   HDL 72.20 >39.00 mg/dL   VLDL 13.0 0.0 - 40.0 mg/dL   LDL Cholesterol 101 (H) 0 - 99 mg/dL   Total CHOL/HDL Ratio 3    NonHDL 113.51   TSH  Result Value Ref Range   TSH 2.06 0.35 - 4.50 uIU/mL

## 2018-09-10 NOTE — Patient Instructions (Addendum)
Great to see you again today! I will be in touch with your labs asap You got your first shingles vaccine today- 2nd dose in 2-6 months  We will also order Cologuard for you to screen for colon cancer- you should get this test in the mail   Health Maintenance, Female Adopting a healthy lifestyle and getting preventive care can go a long way to promote health and wellness. Talk with your health care provider about what schedule of regular examinations is right for you. This is a good chance for you to check in with your provider about disease prevention and staying healthy. In between checkups, there are plenty of things you can do on your own. Experts have done a lot of research about which lifestyle changes and preventive measures are most likely to keep you healthy. Ask your health care provider for more information. Weight and diet Eat a healthy diet  Be sure to include plenty of vegetables, fruits, low-fat dairy products, and lean protein.  Do not eat a lot of foods high in solid fats, added sugars, or salt.  Get regular exercise. This is one of the most important things you can do for your health. ? Most adults should exercise for at least 150 minutes each week. The exercise should increase your heart rate and make you sweat (moderate-intensity exercise). ? Most adults should also do strengthening exercises at least twice a week. This is in addition to the moderate-intensity exercise.  Maintain a healthy weight  Body mass index (BMI) is a measurement that can be used to identify possible weight problems. It estimates body fat based on height and weight. Your health care provider can help determine your BMI and help you achieve or maintain a healthy weight.  For females 45 years of age and older: ? A BMI below 18.5 is considered underweight. ? A BMI of 18.5 to 24.9 is normal. ? A BMI of 25 to 29.9 is considered overweight. ? A BMI of 30 and above is considered obese.  Watch levels of  cholesterol and blood lipids  You should start having your blood tested for lipids and cholesterol at 50 years of age, then have this test every 5 years.  You may need to have your cholesterol levels checked more often if: ? Your lipid or cholesterol levels are high. ? You are older than 50 years of age. ? You are at high risk for heart disease.  Cancer screening Lung Cancer  Lung cancer screening is recommended for adults 5-30 years old who are at high risk for lung cancer because of a history of smoking.  A yearly low-dose CT scan of the lungs is recommended for people who: ? Currently smoke. ? Have quit within the past 15 years. ? Have at least a 30-pack-year history of smoking. A pack year is smoking an average of one pack of cigarettes a day for 1 year.  Yearly screening should continue until it has been 15 years since you quit.  Yearly screening should stop if you develop a health problem that would prevent you from having lung cancer treatment.  Breast Cancer  Practice breast self-awareness. This means understanding how your breasts normally appear and feel.  It also means doing regular breast self-exams. Let your health care provider know about any changes, no matter how small.  If you are in your 20s or 30s, you should have a clinical breast exam (CBE) by a health care provider every 1-3 years as part of a  regular health exam.  If you are 40 or older, have a CBE every year. Also consider having a breast X-ray (mammogram) every year.  If you have a family history of breast cancer, talk to your health care provider about genetic screening.  If you are at high risk for breast cancer, talk to your health care provider about having an MRI and a mammogram every year.  Breast cancer gene (BRCA) assessment is recommended for women who have family members with BRCA-related cancers. BRCA-related cancers include: ? Breast. ? Ovarian. ? Tubal. ? Peritoneal cancers.  Results  of the assessment will determine the need for genetic counseling and BRCA1 and BRCA2 testing.  Cervical Cancer Your health care provider may recommend that you be screened regularly for cancer of the pelvic organs (ovaries, uterus, and vagina). This screening involves a pelvic examination, including checking for microscopic changes to the surface of your cervix (Pap test). You may be encouraged to have this screening done every 3 years, beginning at age 9.  For women ages 82-65, health care providers may recommend pelvic exams and Pap testing every 3 years, or they may recommend the Pap and pelvic exam, combined with testing for human papilloma virus (HPV), every 5 years. Some types of HPV increase your risk of cervical cancer. Testing for HPV may also be done on women of any age with unclear Pap test results.  Other health care providers may not recommend any screening for nonpregnant women who are considered low risk for pelvic cancer and who do not have symptoms. Ask your health care provider if a screening pelvic exam is right for you.  If you have had past treatment for cervical cancer or a condition that could lead to cancer, you need Pap tests and screening for cancer for at least 20 years after your treatment. If Pap tests have been discontinued, your risk factors (such as having a new sexual partner) need to be reassessed to determine if screening should resume. Some women have medical problems that increase the chance of getting cervical cancer. In these cases, your health care provider may recommend more frequent screening and Pap tests.  Colorectal Cancer  This type of cancer can be detected and often prevented.  Routine colorectal cancer screening usually begins at 50 years of age and continues through 50 years of age.  Your health care provider may recommend screening at an earlier age if you have risk factors for colon cancer.  Your health care provider may also recommend using  home test kits to check for hidden blood in the stool.  A small camera at the end of a tube can be used to examine your colon directly (sigmoidoscopy or colonoscopy). This is done to check for the earliest forms of colorectal cancer.  Routine screening usually begins at age 20.  Direct examination of the colon should be repeated every 5-10 years through 50 years of age. However, you may need to be screened more often if early forms of precancerous polyps or small growths are found.  Skin Cancer  Check your skin from head to toe regularly.  Tell your health care provider about any new moles or changes in moles, especially if there is a change in a mole's shape or color.  Also tell your health care provider if you have a mole that is larger than the size of a pencil eraser.  Always use sunscreen. Apply sunscreen liberally and repeatedly throughout the day.  Protect yourself by wearing long sleeves, pants,  a wide-brimmed hat, and sunglasses whenever you are outside.  Heart disease, diabetes, and high blood pressure  High blood pressure causes heart disease and increases the risk of stroke. High blood pressure is more likely to develop in: ? People who have blood pressure in the high end of the normal range (130-139/85-89 mm Hg). ? People who are overweight or obese. ? People who are African American.  If you are 76-80 years of age, have your blood pressure checked every 3-5 years. If you are 37 years of age or older, have your blood pressure checked every year. You should have your blood pressure measured twice-once when you are at a hospital or clinic, and once when you are not at a hospital or clinic. Record the average of the two measurements. To check your blood pressure when you are not at a hospital or clinic, you can use: ? An automated blood pressure machine at a pharmacy. ? A home blood pressure monitor.  If you are between 62 years and 22 years old, ask your health care provider  if you should take aspirin to prevent strokes.  Have regular diabetes screenings. This involves taking a blood sample to check your fasting blood sugar level. ? If you are at a normal weight and have a low risk for diabetes, have this test once every three years after 50 years of age. ? If you are overweight and have a high risk for diabetes, consider being tested at a younger age or more often. Preventing infection Hepatitis B  If you have a higher risk for hepatitis B, you should be screened for this virus. You are considered at high risk for hepatitis B if: ? You were born in a country where hepatitis B is common. Ask your health care provider which countries are considered high risk. ? Your parents were born in a high-risk country, and you have not been immunized against hepatitis B (hepatitis B vaccine). ? You have HIV or AIDS. ? You use needles to inject street drugs. ? You live with someone who has hepatitis B. ? You have had sex with someone who has hepatitis B. ? You get hemodialysis treatment. ? You take certain medicines for conditions, including cancer, organ transplantation, and autoimmune conditions.  Hepatitis C  Blood testing is recommended for: ? Everyone born from [birth year] through 1963. ? Anyone with known risk factors for hepatitis C.  Sexually transmitted infections (STIs)  You should be screened for sexually transmitted infections (STIs) including gonorrhea and chlamydia if: ? You are sexually active and are younger than 50 years of age. ? You are older than 50 years of age and your health care provider tells you that you are at risk for this type of infection. ? Your sexual activity has changed since you were last screened and you are at an increased risk for chlamydia or gonorrhea. Ask your health care provider if you are at risk.  If you do not have HIV, but are at risk, it may be recommended that you take a prescription medicine daily to prevent HIV infection. This  is called pre-exposure prophylaxis (PrEP). You are considered at risk if: ? You are sexually active and do not regularly use condoms or know the HIV status of your partner(s). ? You take drugs by injection. ? You are sexually active with a partner who has HIV.  Talk with your health care provider about whether you are at high risk of being infected with HIV. If you choose  to begin PrEP, you should first be tested for HIV. You should then be tested every 3 months for as long as you are taking PrEP. Pregnancy  If you are premenopausal and you may become pregnant, ask your health care provider about preconception counseling.  If you may become pregnant, take 400 to 800 micrograms (mcg) of folic acid every day.  If you want to prevent pregnancy, talk to your health care provider about birth control (contraception). Osteoporosis and menopause  Osteoporosis is a disease in which the bones lose minerals and strength with aging. This can result in serious bone fractures. Your risk for osteoporosis can be identified using a bone density scan.  If you are 27 years of age or older, or if you are at risk for osteoporosis and fractures, ask your health care provider if you should be screened.  Ask your health care provider whether you should take a calcium or vitamin D supplement to lower your risk for osteoporosis.  Menopause may have certain physical symptoms and risks.  Hormone replacement therapy may reduce some of these symptoms and risks. Talk to your health care provider about whether hormone replacement therapy is right for you. Follow these instructions at home:  Schedule regular health, dental, and eye exams.  Stay current with your immunizations.  Do not use any tobacco products including cigarettes, chewing tobacco, or electronic cigarettes.  If you are pregnant, do not drink alcohol.  If you are breastfeeding, limit how much and how often you drink alcohol.  Limit alcohol intake  to no more than 1 drink per day for nonpregnant women. One drink equals 12 ounces of beer, 5 ounces of wine, or 1 ounces of hard liquor.  Do not use street drugs.  Do not share needles.  Ask your health care provider for help if you need support or information about quitting drugs.  Tell your health care provider if you often feel depressed.  Tell your health care provider if you have ever been abused or do not feel safe at home. This information is not intended to replace advice given to you by your health care provider. Make sure you discuss any questions you have with your health care provider. Document Released: 04/30/2011 Document Revised: 03/22/2016 Document Reviewed: 07/19/2015 Elsevier Interactive Patient Education  [employer].

## 2018-09-17 ENCOUNTER — Encounter: Payer: Self-pay | Admitting: Family Medicine

## 2018-10-01 ENCOUNTER — Encounter: Payer: Self-pay | Admitting: Family Medicine

## 2018-10-01 DIAGNOSIS — Z1211 Encounter for screening for malignant neoplasm of colon: Secondary | ICD-10-CM | POA: Diagnosis not present

## 2018-10-01 DIAGNOSIS — Z1212 Encounter for screening for malignant neoplasm of rectum: Secondary | ICD-10-CM | POA: Diagnosis not present

## 2018-10-02 LAB — COLOGUARD

## 2018-10-08 ENCOUNTER — Encounter: Payer: Self-pay | Admitting: Family Medicine

## 2018-10-21 MED FILL — MOMETASONE FUROATE 50 MCG S: 50 | 30 days supply | Qty: 17 | Fill #2

## 2018-10-21 MED FILL — CARVEDILOL 3.125 MG TABLET: 3.125 | 90 days supply | Qty: 180 | Fill #0

## 2018-10-21 MED FILL — DAPSONE 5% GEL: 5 | 30 days supply | Qty: 90 | Fill #1

## 2018-10-21 MED FILL — LOSARTAN POTASSIUM 50 MG TA: 50 | 90 days supply | Qty: 90 | Fill #0

## 2018-11-07 MED FILL — DOXYCYCLINE HYC 20 MG TAB: 20 | 90 days supply | Qty: 180 | Fill #1

## 2018-11-18 DIAGNOSIS — R87810 Cervical high risk human papillomavirus (HPV) DNA test positive: Secondary | ICD-10-CM | POA: Diagnosis not present

## 2018-11-18 DIAGNOSIS — Z1151 Encounter for screening for human papillomavirus (HPV): Secondary | ICD-10-CM | POA: Diagnosis not present

## 2018-11-18 DIAGNOSIS — R7309 Other abnormal glucose: Secondary | ICD-10-CM | POA: Diagnosis not present

## 2018-11-18 DIAGNOSIS — N843 Polyp of vulva: Secondary | ICD-10-CM | POA: Diagnosis not present

## 2018-11-18 DIAGNOSIS — Z01419 Encounter for gynecological examination (general) (routine) without abnormal findings: Secondary | ICD-10-CM | POA: Diagnosis not present

## 2018-11-18 DIAGNOSIS — Z803 Family history of malignant neoplasm of breast: Secondary | ICD-10-CM | POA: Diagnosis not present

## 2018-12-10 DIAGNOSIS — Z1231 Encounter for screening mammogram for malignant neoplasm of breast: Secondary | ICD-10-CM | POA: Diagnosis not present

## 2018-12-26 ENCOUNTER — Ambulatory Visit (INDEPENDENT_AMBULATORY_CARE_PROVIDER_SITE_OTHER): Payer: 59 | Admitting: *Deleted

## 2018-12-26 DIAGNOSIS — Z23 Encounter for immunization: Secondary | ICD-10-CM

## 2018-12-26 NOTE — Progress Notes (Signed)
Patient here for second shingles vaccine.  ? ?Vaccine given and patient tolerated well. ?

## 2019-01-13 ENCOUNTER — Other Ambulatory Visit: Payer: Self-pay | Admitting: Family Medicine

## 2019-01-13 MED FILL — MOMETASONE FUROATE 50 MCG S: 50 | 30 days supply | Qty: 17 | Fill #0

## 2019-01-13 MED FILL — CARVEDILOL 3.125 MG TABLET: 3.125 | 90 days supply | Qty: 180 | Fill #1

## 2019-01-13 MED FILL — LOSARTAN POTASSIUM 50 MG TA: 50 | 90 days supply | Qty: 90 | Fill #1

## 2019-01-14 MED FILL — DOXYCYCLINE HYC 20 MG TAB: 20 | 90 days supply | Qty: 180 | Fill #2

## 2019-01-16 IMAGING — CT CT ABD-PELV W/ CM
2 of 5 series · 17 of 46 positions shown, 19 images · IV contrast (Omni 300)
Comparison: None.

CLINICAL DATA: Abdominal pain and nausea for a week. Assess for
diverticulitis. History of cholecystectomy.

EXAM:
CT ABDOMEN AND PELVIS WITH CONTRAST
TECHNIQUE: Multidetector CT imaging of the abdomen and pelvis was performed
using the standard protocol following bolus administration of
intravenous contrast.
CONTRAST:  100mL LIC287-RUU IOPAMIDOL (LIC287-RUU) INJECTION 61%

[Series 5: a/p w/ 5mm · axial · 0.67mm/px · z∈[+934,+1284]mm · 14 of 80 slices shown, 16 images]
[im 5/80  soft-tissue]
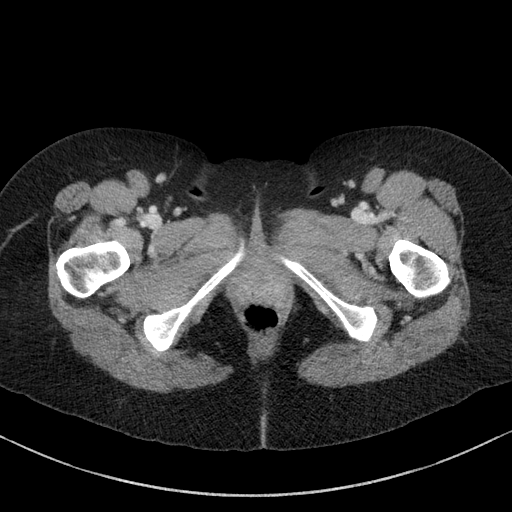
[im 5/80  bone]
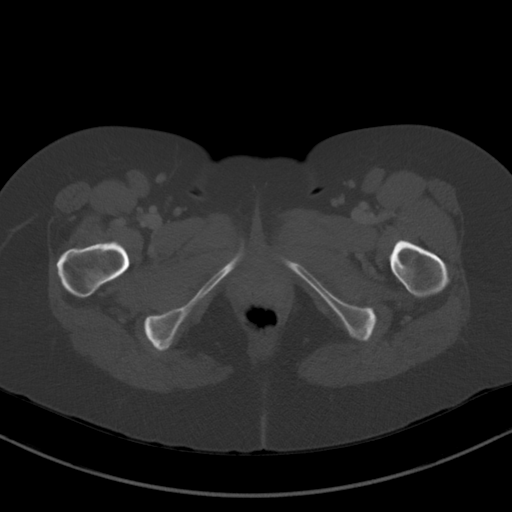
[im 9/80  soft-tissue]
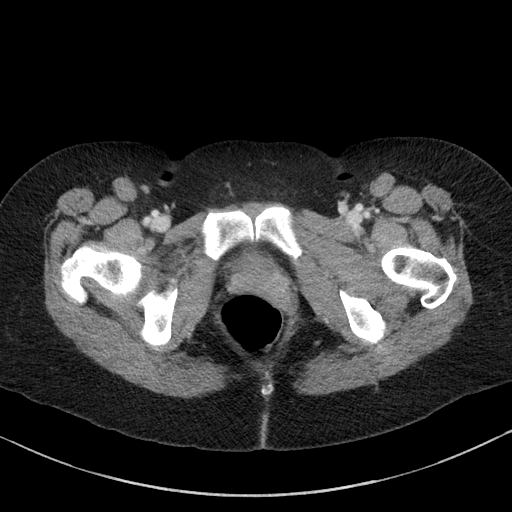
[im 17/80  soft-tissue]
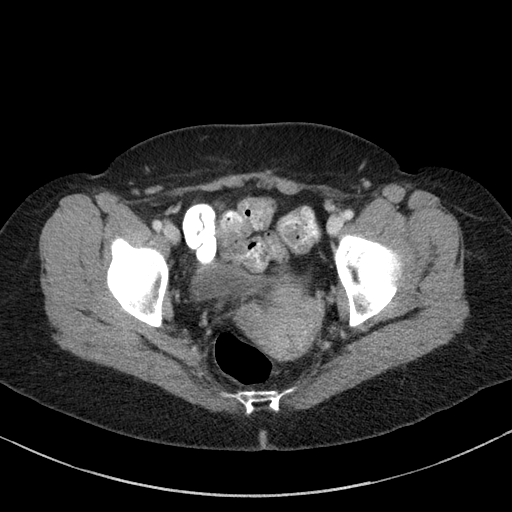
[im 21/80  soft-tissue]
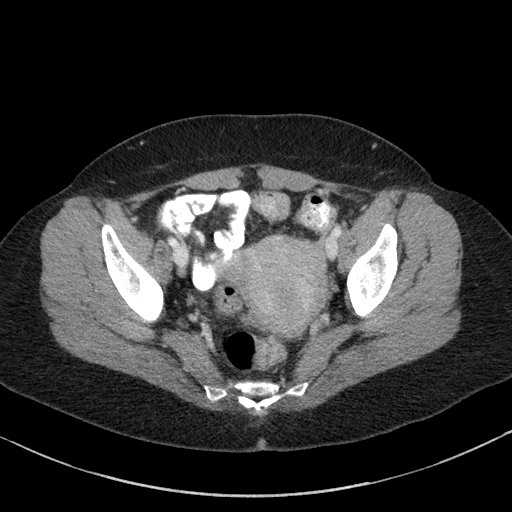
[im 25/80  soft-tissue]
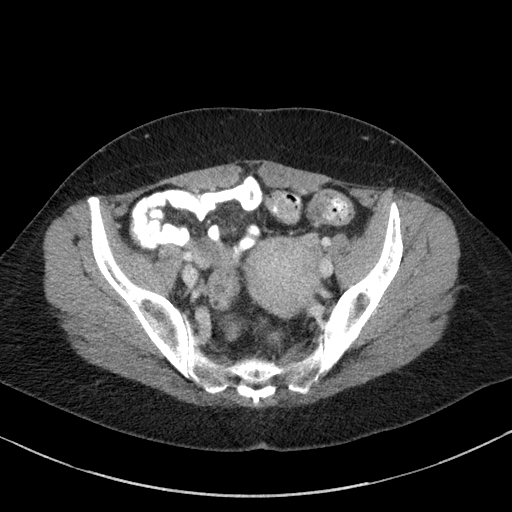
[im 34/80  soft-tissue]
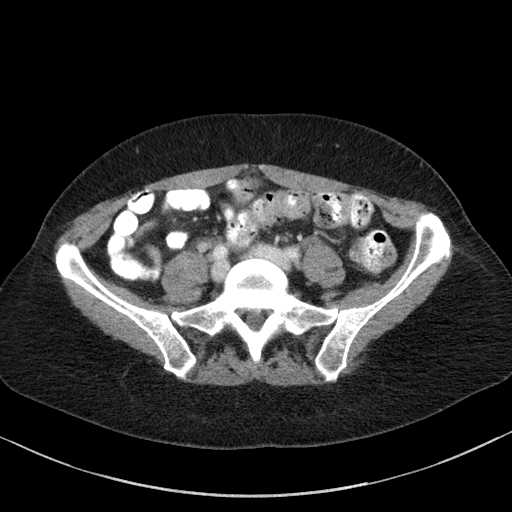
[im 38/80  soft-tissue]
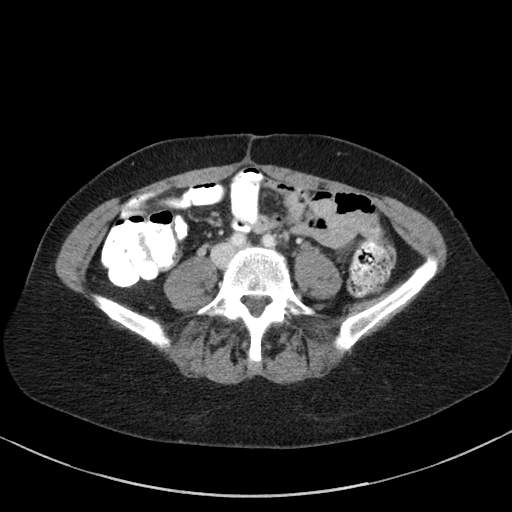
[im 42/80  soft-tissue]
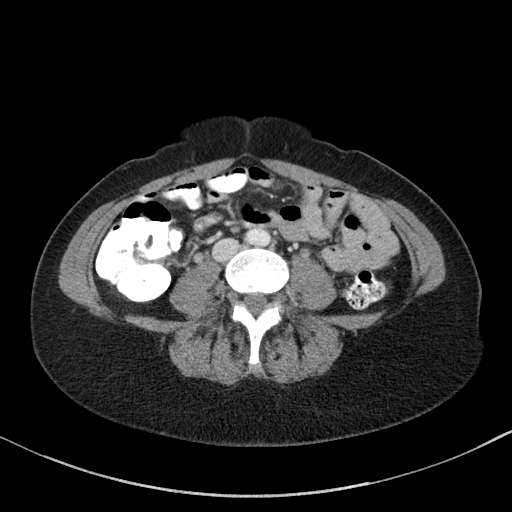
[im 46/80  soft-tissue]
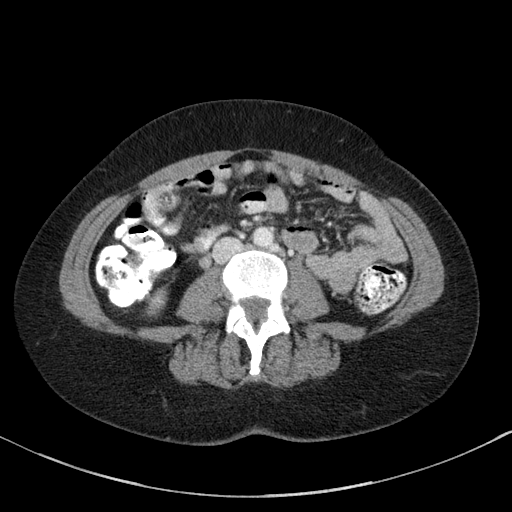
[im 46/80  bone]
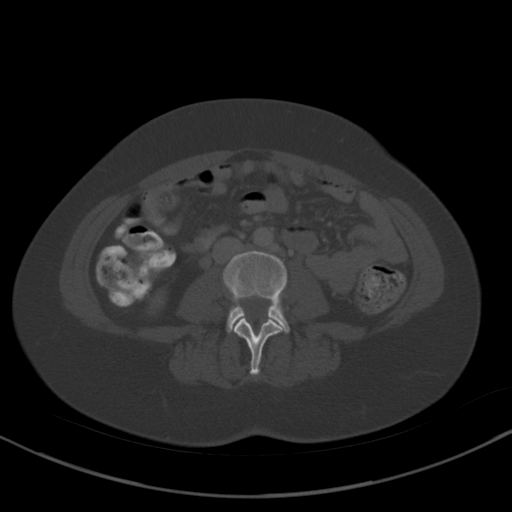
[im 55/80  soft-tissue]
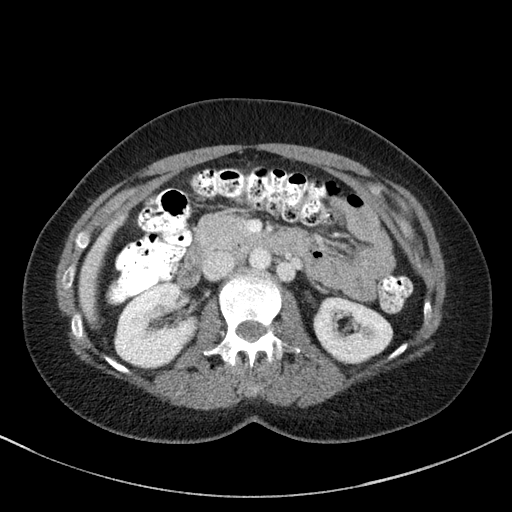
[im 59/80  soft-tissue]
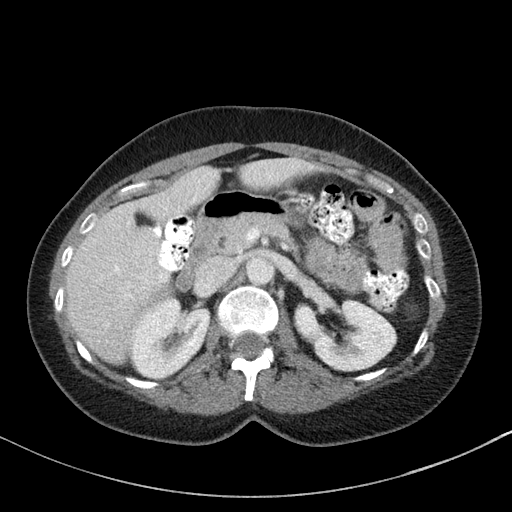
[im 63/80  soft-tissue]
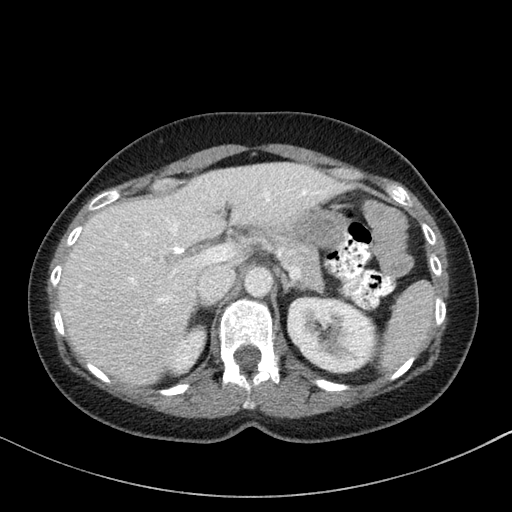
[im 71/80  soft-tissue]
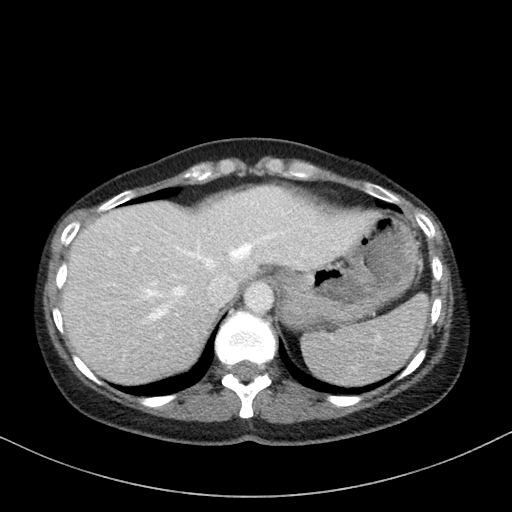
[im 75/80  soft-tissue]
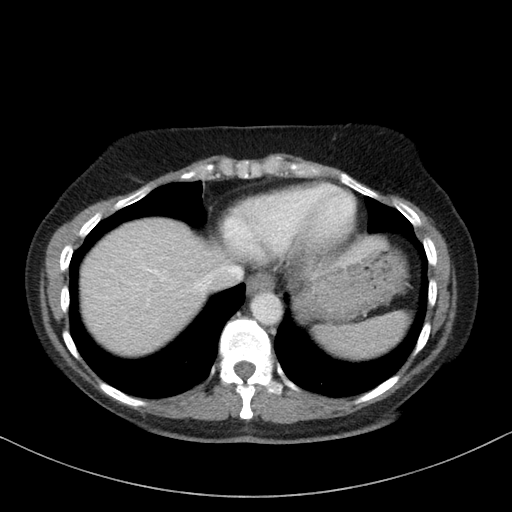

[Series 8: a/p w/ cor · coronal · 0.78mm/px · 3 of 132 slices shown]
[im 44/132  soft-tissue]
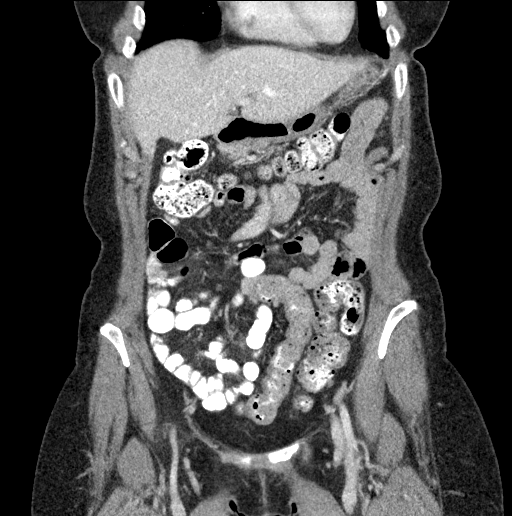
[im 59/132  soft-tissue]
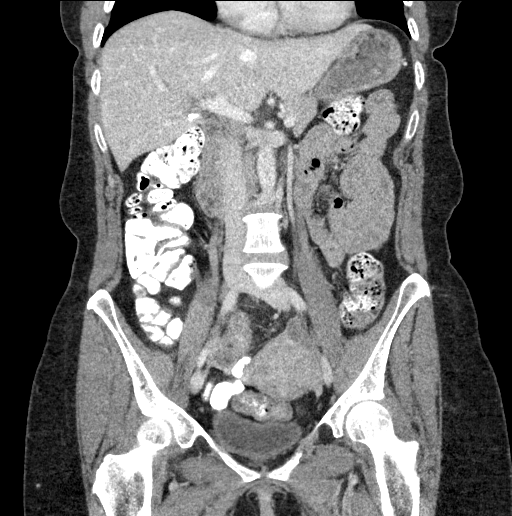
[im 73/132  soft-tissue]
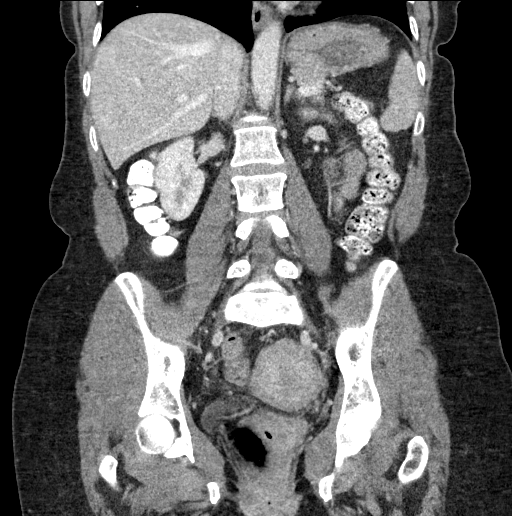

[17 of 46 positions shown; findings below may reference images not displayed]

FINDINGS: LOWER CHEST: Lung bases are clear. Included heart size is normal. No
pericardial effusion.

HEPATOBILIARY: Status post cholecystectomy.  Normal CT liver.

PANCREAS: Normal.

SPLEEN: Normal.

ADRENALS/URINARY TRACT: Kidneys are orthotopic, demonstrating
symmetric enhancement. No nephrolithiasis, hydronephrosis or solid
renal masses. The unopacified ureters are normal in course and
caliber. Delayed imaging through the kidneys demonstrates symmetric
prompt contrast excretion within the proximal urinary collecting
system. Urinary bladder is partially distended and unremarkable.
Normal adrenal glands.

STOMACH/BOWEL: The stomach, small and large bowel are normal in
course and caliber without inflammatory changes. Moderate amount of
retained large bowel stool. A few scattered colonic diverticula
present. Normal appendix.

VASCULAR/LYMPHATIC: Aortoiliac vessels are normal in course and
caliber. No lymphadenopathy by CT size criteria.

REPRODUCTIVE: Normal.

OTHER: No intraperitoneal free fluid or free air.

MUSCULOSKELETAL: Nonacute. Moderate L5-S1 disc osteophyte complex.
Moderate lower thoracic spondylosis. Anterior pelvic wall scarring.
IMPRESSION: 1. Moderate amount of retained large bowel stool without bowel
obstruction or acute intra-abdominal/pelvic process.

## 2019-03-25 DIAGNOSIS — L700 Acne vulgaris: Secondary | ICD-10-CM | POA: Diagnosis not present

## 2019-03-25 DIAGNOSIS — L821 Other seborrheic keratosis: Secondary | ICD-10-CM | POA: Diagnosis not present

## 2019-04-08 MED FILL — DOXYCYCLINE HYC 100 MG CAPS: 100 | 30 days supply | Qty: 60 | Fill #0

## 2019-04-08 MED FILL — LOSARTAN POTASSIUM 50 MG TA: 50 | 30 days supply | Qty: 30 | Fill #2

## 2019-04-08 MED FILL — CARVEDILOL 3.125 MG TABLET: 3.125 | 90 days supply | Qty: 180 | Fill #2

## 2019-04-08 MED FILL — MOMETASONE FUROATE 50 MCG S: 50 | 30 days supply | Qty: 17 | Fill #1

## 2019-05-06 ENCOUNTER — Ambulatory Visit (INDEPENDENT_AMBULATORY_CARE_PROVIDER_SITE_OTHER): Payer: 59 | Admitting: Family Medicine

## 2019-05-06 ENCOUNTER — Encounter: Payer: Self-pay | Admitting: Family Medicine

## 2019-05-06 ENCOUNTER — Other Ambulatory Visit: Payer: Self-pay

## 2019-05-06 DIAGNOSIS — J0110 Acute frontal sinusitis, unspecified: Secondary | ICD-10-CM | POA: Diagnosis not present

## 2019-05-06 MED ORDER — AMOXICILLIN 500 MG PO CAPS: 1000.0000 mg | ORAL_CAPSULE | Freq: Two times a day (BID) | ORAL | 0 refills | Status: DC

## 2019-05-06 NOTE — Progress Notes (Signed)
[place] at [facility] [address], [place], [place] [address] [phone]  Date:  05/06/2019   Name:  Tiffany Parker   DOB:  11-04-1965   MRN:  [id number]  PCP:  [name], MD    Chief Complaint: No chief complaint on file.   History of Present Illness:  Tiffany Parker is a 53 y.o. very pleasant female patient who presents with the following:  Virtual visit today due to illness Pt location is home Provider is at office Pt ID confirmed with 2 factors, she gives consent for virtual visit today   4 days ago she had a sinus type headache- yesterday she started to feel not great in general, and last night she had a fever up to 101.3 max No cough No ST She feels a pressure in her face and nose, seems like a sinus infection to her  No GI symptoms No sign of a UTI  She got tested for COVID today- she thinks she can go back to work once her test is negative  Her husband is having an operation for prostate cancer next week and she really does not want this to be delayed if we can help it   Most recent echo report 08/2018 Study Conclusions  - Left ventricle: The cavity size was normal. Systolic function was   mildly reduced. The estimated ejection fraction was in the range   of 45% to 50%. Wall motion was normal; there were no regional   wall motion abnormalities.  Impressions:  - 1. Left ventricular systolic function mildly depressed visually   estimated at 45 to 50%.   2. Mild tricuspid regurgitation. Patient Active Problem List   Diagnosis Date Noted  . Lower abdominal pain 06/13/2018  . Hx of LASIK 01/09/2018  . Dilated cardiomyopathy ([place]) 09/16/2017  . Abnormal stress test 08/28/2017  . Atypical chest pain 08/28/2017  . Low bone mass 07/09/2016  . Vitamin D deficiency 07/09/2016  . Osteopenia determined by x-ray 10/13/2015  . Spinal stenosis of cervical region 05/03/2014  . Osteophyte of cervical  spine 03/07/2014  . ADHD (attention deficit hyperactivity disorder) 12/22/2012  . Acne 12/22/2012  . Plantar fasciitis 10/11/2012  . DVT (deep venous thrombosis) ([place]) 05/07/2012  . History of cholecystectomy 01/27/2005  . Cesarean delivery due to previous difficult delivery, delivered, current hospitalization 09/29/2001  . Episiotomy dehiscence 11/27/1997    Past Medical History:  Diagnosis Date  . ADHD (attention deficit hyperactivity disorder)   . Allergy   . Arthritis   . Blood transfusion    post delivery  . Blood transfusion without reported diagnosis   . DVT (deep venous thrombosis) ([place]) 04/2012   following spine surgery - right leg  . Headache(784.0)   . PONV (postoperative nausea and vomiting)   . TMJ (dislocation of temporomandibular joint)     Past Surgical History:  Procedure Laterality Date  . ANTERIOR CERVICAL DECOMP/DISCECTOMY FUSION  05/05/2012   Procedure: ANTERIOR CERVICAL DECOMPRESSION/DISCECTOMY FUSION 1 LEVEL;  Surgeon: Elaina Hoops, MD;  Location: [facility];  Service: Neurosurgery;  Laterality: N/A;  Cervical four-five anterior cervical decompression with fusion interbody prothesis plating and bonegraft  . ANTERIOR CERVICAL DECOMP/DISCECTOMY FUSION N/A 05/03/2014   Procedure: CERVICAL FIVE TO SIX, CERVICAL SIX TO SEVEN ANTERIOR CERVICAL DECOMPRESSION/DISCECTOMY FUSION 2 LEVELS;  Surgeon: Elaina Hoops, MD;  Location: [facility];  Service: Neurosurgery;  Laterality: N/A;  . CESAREAN SECTION    .  CHOLECYSTECTOMY    . episiotomy   11/19/97  . episiotomy repair  11/1997  . EYE SURGERY    . LEFT HEART CATH AND CORONARY ANGIOGRAPHY N/A 09/26/2017   Procedure: LEFT HEART CATH AND CORONARY ANGIOGRAPHY;  Surgeon: Belva Crome, MD;  Location: [facility];  Service: Cardiovascular;  Laterality: N/A;    Social History   Tobacco Use  . Smoking status: Never Smoker  . Smokeless tobacco: Never Used  Substance Use Topics  . Alcohol use: Yes    Comment:  socially  . Drug use: No    Family History  Problem Relation Age of Onset  . Cancer Mother   . Hypertension Father   . Hypothyroidism Father   . Diabetes Maternal Grandmother   . Stroke Maternal Grandmother   . Cancer Maternal Grandfather        lung  . Hypertension Paternal Grandmother   . Hyperlipidemia Paternal Grandmother   . Heart disease Paternal Grandmother   . Stroke Paternal Grandfather   . Osteoporosis Neg Hx     Allergies  Allergen Reactions  . Other Other (See Comments)    Anesthesia used in July 2013 caused nausea and vomiting  . Prenatal Vitamins Hives    Patient does not remember the brand of the prenatal vitamin that caused her to have issues.   . Septra [Bactrim] Hives  . Sulfamethoxazole-Trimethoprim Hives    Medication list has been reviewed and updated.  Current Outpatient Medications on File Prior to Visit  Medication Sig Dispense Refill  . ACZONE 5 % topical gel APPLY TOPICALLY 2 TIMES DAILY. (Patient taking differently: APPLY TOPICALLY ONCE TO TWICE  DAILY.) 180 g 3  . aspirin-acetaminophen-caffeine (EXCEDRIN MIGRAINE) 416-606-30 MG per tablet Take 1 tablet by mouth daily as needed for headache.     . B Complex-C (B-COMPLEX WITH VITAMIN C) tablet Take 1 tablet by mouth daily.    Marland Kitchen BIOTIN PO Take 1 tablet by mouth daily.    . Calcium Carbonate-Vitamin D (CALCIUM 600+D) 600-400 MG-UNIT per tablet Take 2 tablets by mouth at bedtime.     . carvedilol (COREG) 3.125 MG tablet Take 1 tablet (3.125 mg total) by mouth 2 (two) times daily. 180 tablet 3  . Cholecalciferol (VITAMIN D) 2000 units CAPS Take 2,000 Units by mouth daily.     Marland Kitchen doxycycline (PERIOSTAT) 20 MG tablet TAKE 1 TABLET (20 MG TOTAL) BY MOUTH TWICE A DAY 180 tablet 3  . losartan (COZAAR) 50 MG tablet Take 1 tablet (50 mg total) by mouth daily. 90 tablet 3  . magnesium oxide (MAG-OX) 400 MG tablet Take 400 mg by mouth at bedtime.     . methocarbamol (ROBAXIN) 500 MG tablet Take 1 tablet (500 mg  total) by mouth every 6 (six) hours as needed for muscle spasms. 60 tablet 1  . mometasone (NASONEX) 50 MCG/ACT nasal spray PLACE 2 SPRAYS INTO THE NOSE DAILY. 17 g 2  . Multiple Vitamin (MULTIVITAMIN WITH MINERALS) TABS Take 1 tablet by mouth daily. Centrum    . Omega-3 Fatty Acids (FISH OIL) 1200 MG CAPS Take 1,200 mg by mouth daily.     Marland Kitchen PATADAY 0.2 % SOLN PLACE 1 DROP IN EACH EYE AS NEEDED FOR DRY/ITCHY EYES OR ALLERGIES 7.5 mL 3  . Probiotic Product (PROBIOTIC PO) Take 1 capsule by mouth daily.    Marland Kitchen tretinoin (RETIN-A) 0.05 % cream APPLY AT BEDTIME TO FACE     No current facility-administered medications on file prior to  visit.     Review of Systems:  No abd sx As per HPI- otherwise negative.   Physical Examination: There were no vitals filed for this visit. There were no vitals filed for this visit. There is no height or weight on file to calculate BMI. Ideal Body Weight:    Pt observed over video- she looks well No cough, wheezing or distress Able to touch her chin to her chest   Assessment and Plan:   ICD-10-CM   1. Acute non-recurrent frontal sinusitis  J01.10 amoxicillin (AMOXIL) 500 MG capsule     Follow-up: No follow-ups on file.  Meds ordered this encounter  Medications  . amoxicillin (AMOXIL) 500 MG capsule    Sig: Take 2 capsules (1,000 mg total) by mouth 2 (two) times daily.    Dispense:  40 capsule    Refill:  0   No orders of the defined types were placed in this encounter.   Pt ill with fever and malaise during covid 19 pandemic Her covid test is pending She thinks might be sinus infection- will start on amox while her covid test is pending  She will seek care if any other concerns, will update me about her covid test when possible   Signed Lamar Blinks, MD

## 2019-05-19 ENCOUNTER — Encounter: Payer: Self-pay | Admitting: Physician Assistant

## 2019-05-19 ENCOUNTER — Ambulatory Visit
Admission: EM | Admit: 2019-05-19 | Discharge: 2019-05-19 | Disposition: A | Discharge status: Home / Self Care | Payer: 59 | Attending: Physician Assistant | Admitting: Physician Assistant

## 2019-05-19 ENCOUNTER — Encounter (INDEPENDENT_AMBULATORY_CARE_PROVIDER_SITE_OTHER): Payer: Self-pay

## 2019-05-19 ENCOUNTER — Other Ambulatory Visit: Payer: Self-pay

## 2019-05-19 ENCOUNTER — Inpatient Hospital Stay: Admission: RE | Admit: 2019-05-19 | Payer: 59 | Source: Ambulatory Visit

## 2019-05-19 ENCOUNTER — Ambulatory Visit (INDEPENDENT_AMBULATORY_CARE_PROVIDER_SITE_OTHER): Payer: 59

## 2019-05-19 ENCOUNTER — Telehealth: Payer: 59 | Admitting: Physician Assistant

## 2019-05-19 DIAGNOSIS — R05 Cough: Secondary | ICD-10-CM

## 2019-05-19 DIAGNOSIS — R5383 Other fatigue: Secondary | ICD-10-CM | POA: Diagnosis not present

## 2019-05-19 DIAGNOSIS — R0600 Dyspnea, unspecified: Secondary | ICD-10-CM

## 2019-05-19 DIAGNOSIS — R067 Sneezing: Secondary | ICD-10-CM

## 2019-05-19 DIAGNOSIS — U071 COVID-19: Secondary | ICD-10-CM

## 2019-05-19 DIAGNOSIS — R059 Cough, unspecified: Secondary | ICD-10-CM

## 2019-05-19 DIAGNOSIS — G4489 Other headache syndrome: Secondary | ICD-10-CM

## 2019-05-19 DIAGNOSIS — R0602 Shortness of breath: Secondary | ICD-10-CM | POA: Diagnosis not present

## 2019-05-19 MED ORDER — DEXAMETHASONE SODIUM PHOSPHATE 10 MG/ML IJ SOLN
10.0000 mg | Freq: Once | INTRAMUSCULAR | Status: AC
  Administered 2019-05-19: 10 mg via INTRAMUSCULAR

## 2019-05-19 MED ORDER — AZITHROMYCIN 250 MG PO TABS: 250.0000 mg | ORAL_TABLET | Freq: Every day | ORAL | 0 refills | Status: DC

## 2019-05-19 MED ORDER — BENZONATATE 100 MG PO CAPS: 100.0000 mg | ORAL_CAPSULE | Freq: Two times a day (BID) | ORAL | 0 refills | Status: DC | PRN

## 2019-05-19 MED ORDER — ALBUTEROL SULFATE HFA 108 (90 BASE) MCG/ACT IN AERS: 2.0000 | INHALATION_SPRAY | Freq: Four times a day (QID) | RESPIRATORY_TRACT | 0 refills | Status: DC | PRN

## 2019-05-19 MED ORDER — ALBUTEROL SULFATE HFA 108 (90 BASE) MCG/ACT IN AERS: 1.0000 | INHALATION_SPRAY | Freq: Four times a day (QID) | RESPIRATORY_TRACT | 0 refills | Status: DC | PRN

## 2019-05-19 NOTE — Progress Notes (Signed)
Called pt's pharmacy and cancelled Rx for Tessalon and Albuterol inhlaer

## 2019-05-19 NOTE — ED Triage Notes (Signed)
Pt states tested positive for COVID 2wks ago. C/o cough, sneezing, lost of taste and smell and SOB

## 2019-05-19 NOTE — Discharge Instructions (Signed)
Chest xray shows multiple focal pneumonia. Start azithromycin to  cover for bacterial pneumonia. Albuterol as needed. Decadron injection in office today. If increased shortness of breath, go to ED for further evaluation needed.

## 2019-05-19 NOTE — Addendum Note (Signed)
Addended by: Waldon Merl on: 05/19/2019 02:54 PM   Modules accepted: Orders

## 2019-05-19 NOTE — Progress Notes (Signed)
E-Visit for Corona Virus Screening   Your current symptoms could be consistent with the coronavirus.  Many health care providers can now test patients at their office but not all are.  [facility] has multiple testing sites. For information on our COVID testing locations and hours go to [email or web address]  Please quarantine yourself while awaiting your test results.  We are enrolling you in our [facility] for [facility] . Daily you will receive a questionnaire within the [facility] website. Our COVID 19 response team willl be monitoriing your responses daily.    COVID-19 is a respiratory illness with symptoms that are similar to the flu. Symptoms are typically mild to moderate, but there have been cases of severe illness and death due to the virus. The following symptoms may appear 2-14 days after exposure: . Fever . Cough . Shortness of breath or difficulty breathing . Chills . Repeated shaking with chills . Muscle pain . Headache . Sore throat . New loss of taste or smell . Fatigue . Congestion or runny nose . Nausea or vomiting . Diarrhea  It is vitally important that if you feel that you have an infection such as this virus or any other virus that you stay home and away from places where you may spread it to others.  You should self-quarantine for 14 days if you have symptoms that could potentially be coronavirus or have been in close contact a with a person diagnosed with COVID-19 within the last 2 weeks. You should avoid contact with people age 70 and older.   You should wear a mask or cloth face covering over your nose and mouth if you must be around other people or animals, including pets (even at home). Try to stay at least 6 feet away from other people. This will protect the people around you.  You can use medication such as A prescription cough medication called Tessalon Perles 100 mg. You may take 1-2 capsules every 8 hours as needed for cough  and A prescription inhaler called Albuterol MDI 90 mcg /actuation 2 puffs every 4 hours as needed for shortness of breath, wheezing, cough  You may also take acetaminophen (Tylenol) as needed for fever.  You have been enrolled in [facility], which will contact you regarding your symptoms.  I have provided a work note    Reduce your risk of any infection by using the same precautions used for avoiding the common cold or flu:  Marland Kitchen Wash your hands often with soap and warm water for at least 20 seconds.  If soap and water are not readily available, use an alcohol-based hand sanitizer with at least 60% alcohol.  . If coughing or sneezing, cover your mouth and nose by coughing or sneezing into the elbow areas of your shirt or coat, into a tissue or into your sleeve (not your hands). . Avoid shaking hands with others and consider head nods or verbal greetings only. . Avoid touching your eyes, nose, or mouth with unwashed hands.  . Avoid close contact with people who are sick. . Avoid places or events with large numbers of people in one location, like concerts or sporting events. . Carefully consider travel plans you have or are making. . If you are planning any travel outside or inside the [place], visit the CDC's Travelers' Health webpage for the latest health notices. . If you have some symptoms but not all symptoms, continue to monitor at home and seek medical attention if your  symptoms worsen. . If you are having a medical emergency, call 911.  HOME CARE . Only take medications as instructed by your medical team. . Drink plenty of fluids and get plenty of rest. . A steam or ultrasonic humidifier can help if you have congestion.   GET HELP RIGHT AWAY IF YOU HAVE EMERGENCY WARNING SIGNS** FOR COVID-19. If you or someone is showing any of these signs seek emergency medical care immediately. Call 911 or proceed to your closest emergency facility if: . You develop  worsening high fever. . Trouble breathing . Bluish lips or face . Persistent pain or pressure in the chest . New confusion . Inability to wake or stay awake . You cough up blood. . Your symptoms become more severe  **This list is not all possible symptoms. Contact your medical provider for any symptoms that are sever or concerning to you.   MAKE SURE YOU   Understand these instructions.  Will watch your condition.  Will get help right away if you are not doing well or get worse.  Your e-visit answers were reviewed by a board certified advanced clinical practitioner to complete your personal care plan.  Depending on the condition, your plan could have included both over the counter or prescription medications.  If there is a problem please reply once you have received a response from your provider.  Your safety is important to [place].  If you have drug allergies check your prescription carefully.    You can use MyChart to ask questions about today's visit, request a non-urgent call back, or ask for a work or school excuse for 24 hours related to this e-Visit. If it has been greater than 24 hours you will need to follow up with your provider, or enter a new e-Visit to address those concerns. You will get an e-mail in the next two days asking about your experience.  I hope that your e-visit has been valuable and will speed your recovery. Thank you for using e-visits.   I spent 5-10 minutes on review and completion of this note- Tiffany Parker [facility]

## 2019-05-19 NOTE — ED Provider Notes (Signed)
[facility]    CSN: [id number] Arrival date & time: 05/19/19  1537     History   Chief Complaint Chief Complaint  Patient presents with  . Shortness of Breath    HPI Tiffany Parker is a 53 y.o. female.   53 year old female comes in for evaluation of worsening symptoms after being diagnosed with COVID 2 weeks ago.  She has continued to have cough, sneezing, shortness of breath, fatigue.  States few days ago, started having loss of taste and smell.  Fever has since resolved.  States shortness of breath is usually due to exertion.  Has not had trouble talking.  Given symptoms has not improved, she did ED visits, who recommended Tessalon/inhaler.  However, a chest x-ray was also recommended, and she therefore came in for evaluation.     Past Medical History:  Diagnosis Date  . ADHD (attention deficit hyperactivity disorder)   . Allergy   . Arthritis   . Blood transfusion    post delivery  . Blood transfusion without reported diagnosis   . DVT (deep venous thrombosis) ([place]) 04/2012   following spine surgery - right leg  . Headache(784.0)   . PONV (postoperative nausea and vomiting)   . TMJ (dislocation of temporomandibular joint)     Patient Active Problem List   Diagnosis Date Noted  . Lower abdominal pain 06/13/2018  . Hx of LASIK 01/09/2018  . Dilated cardiomyopathy ([place]) 09/16/2017  . Abnormal stress test 08/28/2017  . Atypical chest pain 08/28/2017  . Low bone mass 07/09/2016  . Vitamin D deficiency 07/09/2016  . Osteopenia determined by x-ray 10/13/2015  . Spinal stenosis of cervical region 05/03/2014  . 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 ADHD (attention deficit hyperactivity disorder) 12/22/2012  . Acne 12/22/2012  . Plantar fasciitis 10/11/2012  . DVT (deep venous thrombosis) ([place]) 05/07/2012  . History of cholecystectomy 01/27/2005  . Cesarean delivery due to previous difficult delivery, delivered, current hospitalization 09/29/2001   . 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 ANTERIOR CERVICAL DECOMP/DISCECTOMY FUSION  05/05/2012   Procedure: ANTERIOR CERVICAL DECOMPRESSION/DISCECTOMY FUSION 1 LEVEL;  Surgeon: Elaina Hoops, MD;  Location: [facility];  Service: Neurosurgery;  Laterality: N/A;  Cervical four-five anterior cervical decompression with fusion interbody prothesis plating and bonegraft  . ANTERIOR CERVICAL DECOMP/DISCECTOMY FUSION N/A 05/03/2014   Procedure: CERVICAL FIVE TO SIX, CERVICAL SIX TO SEVEN ANTERIOR CERVICAL DECOMPRESSION/DISCECTOMY FUSION 2 LEVELS;  Surgeon: Elaina Hoops, MD;  Location: [facility];  Service: Neurosurgery;  Laterality: N/A;  . CESAREAN SECTION    . CHOLECYSTECTOMY    . episiotomy   11/19/97  . episiotomy repair  11/1997  . EYE SURGERY    . LEFT HEART CATH AND CORONARY ANGIOGRAPHY N/A 09/26/2017   Procedure: LEFT HEART CATH AND CORONARY ANGIOGRAPHY;  Surgeon: Belva Crome, MD;  Location: [facility];  Service: Cardiovascular;  Laterality: N/A;    OB History   No obstetric history on file.      Home Medications    Prior to Admission medications   Medication Sig Start Date End Date Taking? Authorizing Provider  ACZONE 5 % topical gel APPLY TOPICALLY 2 TIMES DAILY. Patient taking differently: APPLY TOPICALLY ONCE TO TWICE  DAILY. 02/27/16   Copland, Gay Filler, MD  albuterol (VENTOLIN HFA) 108 (90 Base) MCG/ACT inhaler Inhale 1-2 puffs into the lungs every 6 (six) hours as needed for wheezing or shortness of  breath. 05/19/19   Tasia Catchings, Daaiyah Baumert V, PA-C  amoxicillin (AMOXIL) 500 MG capsule Take 2 capsules (1,000 mg total) by mouth 2 (two) times daily. 05/06/19   Copland, Gay Filler, MD  aspirin-acetaminophen-caffeine (EXCEDRIN MIGRAINE) [phone] MG per tablet Take 1 tablet by mouth daily as needed for headache.     [provider]  azithromycin (ZITHROMAX) 250 MG tablet Take 1 tablet (250 mg total) by mouth daily. Take first 2  tablets together, then 1 every day until finished. 05/19/19   Tabytha Gradillas V, PA-C  B Complex-C (B-COMPLEX WITH VITAMIN C) tablet Take 1 tablet by mouth daily.    [provider]  BIOTIN PO Take 1 tablet by mouth daily.    [provider]  Calcium Carbonate-Vitamin D (CALCIUM 600+D) 600-400 MG-UNIT per tablet Take 2 tablets by mouth at bedtime.     [provider]  carvedilol (COREG) 3.125 MG tablet Take 1 tablet (3.125 mg total) by mouth 2 (two) times daily. 09/03/18 12/02/18  Park Liter, MD  Cholecalciferol (VITAMIN D) 2000 units CAPS Take 2,000 Units by mouth daily.     [provider]  doxycycline (PERIOSTAT) 20 MG tablet TAKE 1 TABLET (20 MG TOTAL) BY MOUTH TWICE A DAY 08/04/18   Copland, Gay Filler, MD  losartan (COZAAR) 50 MG tablet Take 1 tablet (50 mg total) by mouth daily. 09/03/18 12/02/18  Park Liter, MD  magnesium oxide (MAG-OX) 400 MG tablet Take 400 mg by mouth at bedtime.     [provider]  methocarbamol (ROBAXIN) 500 MG tablet Take 1 tablet (500 mg total) by mouth every 6 (six) hours as needed for muscle spasms. 11/30/16   Copland, Gay Filler, MD  mometasone (NASONEX) 50 MCG/ACT nasal spray PLACE 2 SPRAYS INTO THE NOSE DAILY. 01/13/19   Copland, Gay Filler, MD  Multiple Vitamin (MULTIVITAMIN WITH MINERALS) TABS Take 1 tablet by mouth daily. Centrum    [provider]  Omega-3 Fatty Acids (FISH OIL) 1200 MG CAPS Take 1,200 mg by mouth daily.     [provider]  PATADAY 0.2 % SOLN PLACE 1 DROP IN EACH EYE AS NEEDED FOR DRY/ITCHY EYES OR ALLERGIES 02/27/16   Copland, Gay Filler, MD  Probiotic Product (PROBIOTIC PO) Take 1 capsule by mouth daily.    [provider]  tretinoin (RETIN-A) 0.05 % cream APPLY AT BEDTIME TO FACE 10/24/17   [provider]    Family History Family History  Problem Relation Age of Onset  . Cancer Mother   . Hypertension Father   . Hypothyroidism Father   . Diabetes Maternal  Grandmother   . Stroke Maternal Grandmother   . Cancer Maternal Grandfather        lung  . Hypertension Paternal Grandmother   . Hyperlipidemia Paternal Grandmother   . Heart disease Paternal Grandmother   . Stroke Paternal Grandfather   . Osteoporosis Neg Hx     Social History Social History   Tobacco Use  . Smoking status: Never Smoker  . Smokeless tobacco: Never Used  Substance Use Topics  . Alcohol use: Yes    Comment: socially  . Drug use: No     Allergies   Other, Prenatal vitamins, Septra [bactrim], and Sulfamethoxazole-trimethoprim   Review of Systems Review of Systems  Reason unable to perform ROS: See HPI as above.     Physical Exam Triage Vital Signs ED Triage Vitals  Enc Vitals Group     BP 05/19/19 1555 125/85  Pulse Rate 05/19/19 1555 69     Resp 05/19/19 1555 20     Temp 05/19/19 1555 98.5 F (36.9 C)     Temp Source 05/19/19 1555 Oral     SpO2 05/19/19 1555 97 %     Weight --      Height --      Head Circumference --      Peak Flow --      Pain Score 05/19/19 1556 2     Pain Loc --      Pain Edu? --      Excl. in [place]? --    No data found.  Updated Vital Signs BP 125/85 (BP Location: Left Arm)   Pulse 69   Temp 98.5 F (36.9 C) (Oral)   Resp 20   SpO2 97%   Physical Exam Constitutional:      General: She is not in acute distress.    Appearance: Normal appearance. She is not ill-appearing, toxic-appearing or diaphoretic.  HENT:     Head: Normocephalic and atraumatic.     Mouth/Throat:     Mouth: Mucous membranes are moist.     Pharynx: Oropharynx is clear. Uvula midline.  Neck:     Musculoskeletal: Normal range of motion and neck supple.  Cardiovascular:     Rate and Rhythm: Normal rate and regular rhythm.     Heart sounds: Normal heart sounds. No murmur. No friction rub. No gallop.   Pulmonary:     Effort: Pulmonary effort is normal. No accessory muscle usage, prolonged expiration, respiratory distress or retractions.      Comments: Speaking in full sentences without difficulty. Lungs clear to auscultation without adventitious lung sounds. Neurological:     General: No focal deficit present.     Mental Status: She is alert and oriented to person, place, and time.     UC Treatments / Results  Labs (all labs ordered are listed, but only abnormal results are displayed) Labs Reviewed - No data to display  EKG   Radiology Dg Chest 2 View  Result Date: 05/19/2019 CLINICAL DATA:  Tested positive for COVID-19 2 weeks ago. Complaining of worsening shortness of breath and cough. EXAM: CHEST - 2 VIEW COMPARISON:  Chest x-ray dated 08/19/2017. FINDINGS: Streaky and ill-defined airspace opacities bilaterally, RIGHT greater than LEFT. No pleural effusions seen. Heart size and mediastinal contours are within normal limits. No acute or suspicious osseous finding. IMPRESSION: Streaky and ill-defined airspace opacities bilaterally, RIGHT greater than LEFT, most likely multifocal pneumonia. Electronically Signed   By: Franki Cabot M.D.   On: 05/19/2019 16:29    Procedures Procedures (including critical care time)  Medications Ordered in UC Medications  dexamethasone (DECADRON) injection 10 mg (10 mg Intramuscular Given 05/19/19 1709)    Initial Impression / Assessment and Plan / UC Course  I have reviewed the triage vital signs and the nursing notes.  Pertinent labs & imaging results that were available during my care of the patient were reviewed by me and considered in my medical decision making (see chart for details).     Discussed chest x-ray results.  Will provide Decadron injection in office today.  Start azithromycin to cover for bacterial pneumonia.  Albuterol as needed to see if it helps with shortness of breath.  Return precautions given.  Patient expresses understanding and agrees to plan.  Final Clinical Impressions(s) / UC Diagnoses   Final diagnoses:  COVID-19 virus infection   ED Prescriptions     Medication  Sig Dispense Auth. Provider   azithromycin (ZITHROMAX) 250 MG tablet Take 1 tablet (250 mg total) by mouth daily. Take first 2 tablets together, then 1 every day until finished. 6 tablet Baldo Hufnagle V, PA-C   albuterol (VENTOLIN HFA) 108 (90 Base) MCG/ACT inhaler Inhale 1-2 puffs into the lungs every 6 (six) hours as needed for wheezing or shortness of breath. 18 g Tobin Chad, [place] 05/19/19 1727

## 2019-05-19 NOTE — Progress Notes (Signed)
Based on what you shared with me, I feel your condition warrants further evaluation and I recommend that you be seen for a face to face office visit.  NOTE: If you entered your credit card information for this eVisit, you will not be charged. You may see a "hold" on your card for the $35 but that hold will drop off and you will not have a charge processed.  If you are having a true medical emergency please call 911.    Ms. Tiffany Parker As you have stated, you were diagnosed with Covid 19 approx 2 weeks ago but developed this persistent cough and shortness of breath. Therefore, I recommend that you have a face to face evaluation of your symptoms.    For an urgent face to face visit, [facility] has five urgent care centers for your convenience:    [email or web address] to reserve your spot online an avoid wait times  [name] (New Address!) [address] [place], [place] [address] *Just off [address], across the road from [place] hours of operation: Monday-Friday, 12 PM to 6 PM  Closed Saturday & Sunday   The following sites will take your insurance:  . [facility]    [phone]                  Get Driving Directions  [address], [place] [address] . 10 am to 8 pm Monday-Friday . 12 pm to 8 pm Saturday-Sunday   . [facility] at [place]                  Get Driving Directions  [address], [address], [place] [address] . 8 am to 8 pm Monday-Friday . 9 am to 6 pm Saturday . 11 am to 6 pm Sunday   . [facility] at [place]                  Get Driving Directions   [address] [place], [place] [address] . 8 am to 8 pm Monday-Friday . 8 am to 4 pm Saturday-Sunday    . [facility] at [place]                    Get Driving Directions  [phone]  [address]  [place], [place] [address]  . Monday-Friday, 12 PM to 6 PM    Your e-visit answers were reviewed by a board certified advanced clinical practitioner to complete your personal care plan.  Thank you for using e-Visits. I spent 5-10 minutes on review and completion of this note- Lacy Duverney [facility]

## 2019-05-20 ENCOUNTER — Encounter (INDEPENDENT_AMBULATORY_CARE_PROVIDER_SITE_OTHER): Payer: Self-pay

## 2019-05-20 ENCOUNTER — Encounter: Payer: Self-pay | Admitting: Family Medicine

## 2019-05-20 ENCOUNTER — Ambulatory Visit (INDEPENDENT_AMBULATORY_CARE_PROVIDER_SITE_OTHER): Payer: 59 | Admitting: Family Medicine

## 2019-05-20 DIAGNOSIS — Z86718 Personal history of other venous thrombosis and embolism: Secondary | ICD-10-CM | POA: Diagnosis not present

## 2019-05-20 DIAGNOSIS — R0602 Shortness of breath: Secondary | ICD-10-CM | POA: Diagnosis not present

## 2019-05-20 NOTE — Progress Notes (Signed)
[place] at [facility] [address], [place], [place] [address] [phone]  Date:  05/20/2019   Name:  Tiffany Parker   DOB:  1966-05-03   MRN:  [id number]  PCP:  [name], MD    Chief Complaint: No chief complaint on file.   History of Present Illness:  Tiffany Parker is a 53 y.o. very pleasant female patient who presents with the following:  Virtual visit today due to recent positive COVID test- she got her test on 7/8 Her first day out was July 8th- she has been out of work since then.  She is sending me FMLA paperwork   Plan to return 06/01/2019 assuming all goes well  Patient location is home, provider location is office Patient identity confirmed with 2 factors, she gives consent for virtual visit today  Tiffany Parker has history of spinal stenosis status post operative repair and then DVT in 2013, ADD, dilated cardiomyopathy with EF of 45 to 50% on most recent echo.  She is followed by cardiology  She was seen at urgent care yesterday with worsening symptoms of COVID. She started feeling worse over this past weekend with more SOB and cough No significant fever She had a chest x-ray which showed pneumonia, was started on azithromycin and given a shot of Decadron.  Her sats were 97%  Dg Chest 2 View  Result Date: 05/19/2019 CLINICAL DATA:  Tested positive for COVID-19 2 weeks ago. Complaining of worsening shortness of breath and cough. EXAM: CHEST - 2 VIEW COMPARISON:  Chest x-ray dated 08/19/2017. FINDINGS: Streaky and ill-defined airspace opacities bilaterally, RIGHT greater than LEFT. No pleural effusions seen. Heart size and mediastinal contours are within normal limits. No acute or suspicious osseous finding. IMPRESSION: Streaky and ill-defined airspace opacities bilaterally, RIGHT greater than LEFT, most likely multifocal pneumonia. Electronically Signed   By: Franki Cabot M.D.   On: 05/19/2019 16:29   She does feel  a bit better now Her cough is a bit better She is still SOB She does not have a pulse ox at her house   Patient Active Problem List   Diagnosis Date Noted  . Lower abdominal pain 06/13/2018  . Hx of LASIK 01/09/2018  . Dilated cardiomyopathy ([place]) 09/16/2017  . Abnormal stress test 08/28/2017  . Atypical chest pain 08/28/2017  . Low bone mass 07/09/2016  . Vitamin D deficiency 07/09/2016  . Osteopenia determined by x-ray 10/13/2015  . Spinal stenosis of cervical region 05/03/2014  . 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 ADHD (attention deficit hyperactivity disorder) 12/22/2012  . Acne 12/22/2012  . Plantar fasciitis 10/11/2012  . DVT (deep venous thrombosis) ([place]) 05/07/2012  . History of cholecystectomy 01/27/2005  . Cesarean delivery due to previous difficult delivery, delivered, current hospitalization 09/29/2001  . Episiotomy dehiscence 11/27/1997    Past Medical History:  Diagnosis Date  . ADHD (attention deficit hyperactivity disorder)   . Allergy   . Arthritis   . Blood transfusion    post delivery  . Blood transfusion without reported diagnosis   . DVT (deep venous thrombosis) ([place]) 04/2012   following spine surgery - right leg  . Headache(784.0)   . PONV (postoperative nausea and vomiting)   . TMJ (dislocation of temporomandibular joint)     Past Surgical History:  Procedure Laterality Date  . ANTERIOR CERVICAL DECOMP/DISCECTOMY FUSION  05/05/2012   Procedure: ANTERIOR CERVICAL DECOMPRESSION/DISCECTOMY FUSION 1 LEVEL;  Surgeon: Julien Girt  Saintclair Halsted, MD;  Location: [place] [facility];  Service: Neurosurgery;  Laterality: N/A;  Cervical four-five anterior cervical decompression with fusion interbody prothesis plating and bonegraft  . ANTERIOR CERVICAL DECOMP/DISCECTOMY FUSION N/A 05/03/2014   Procedure: CERVICAL FIVE TO SIX, CERVICAL SIX TO SEVEN ANTERIOR CERVICAL DECOMPRESSION/DISCECTOMY FUSION 2 LEVELS;  Surgeon: Elaina Hoops, MD;  Location: [place] [facility];  Service:  Neurosurgery;  Laterality: N/A;  . CESAREAN SECTION    . CHOLECYSTECTOMY    . episiotomy   11/19/97  . episiotomy repair  11/1997  . EYE SURGERY    . LEFT HEART CATH AND CORONARY ANGIOGRAPHY N/A 09/26/2017   Procedure: LEFT HEART CATH AND CORONARY ANGIOGRAPHY;  Surgeon: Belva Crome, MD;  Location: [place] [facility];  Service: Cardiovascular;  Laterality: N/A;    Social History   Tobacco Use  . Smoking status: Never Smoker  . Smokeless tobacco: Never Used  Substance Use Topics  . Alcohol use: Yes    Comment: socially  . Drug use: No    Family History  Problem Relation Age of Onset  . Cancer Mother   . Hypertension Father   . Hypothyroidism Father   . Diabetes Maternal Grandmother   . Stroke Maternal Grandmother   . Cancer Maternal Grandfather        lung  . Hypertension Paternal Grandmother   . Hyperlipidemia Paternal Grandmother   . Heart disease Paternal Grandmother   . Stroke Paternal Grandfather   . Osteoporosis Neg Hx     Allergies  Allergen Reactions  . Other Other (See Comments)    Anesthesia used in July 2013 caused nausea and vomiting  . Prenatal Vitamins Hives    Patient does not remember the brand of the prenatal vitamin that caused her to have issues.   . Septra [Bactrim] Hives  . Sulfamethoxazole-Trimethoprim Hives    Medication list has been reviewed and updated.  Current Outpatient Medications on File Prior to Visit  Medication Sig Dispense Refill  . ACZONE 5 % topical gel APPLY TOPICALLY 2 TIMES DAILY. (Patient taking differently: APPLY TOPICALLY ONCE TO TWICE  DAILY.) 180 g 3  . albuterol (VENTOLIN HFA) 108 (90 Base) MCG/ACT inhaler Inhale 1-2 puffs into the lungs every 6 (six) hours as needed for wheezing or shortness of breath. 18 g 0  . amoxicillin (AMOXIL) 500 MG capsule Take 2 capsules (1,000 mg total) by mouth 2 (two) times daily. 40 capsule 0  . aspirin-acetaminophen-caffeine (EXCEDRIN MIGRAINE) 929-244-62 MG per tablet Take 1 tablet by  mouth daily as needed for headache.     Marland Kitchen azithromycin (ZITHROMAX) 250 MG tablet Take 1 tablet (250 mg total) by mouth daily. Take first 2 tablets together, then 1 every day until finished. 6 tablet 0  . B Complex-C (B-COMPLEX WITH VITAMIN C) tablet Take 1 tablet by mouth daily.    Marland Kitchen BIOTIN PO Take 1 tablet by mouth daily.    . Calcium Carbonate-Vitamin D (CALCIUM 600+D) 600-400 MG-UNIT per tablet Take 2 tablets by mouth at bedtime.     . carvedilol (COREG) 3.125 MG tablet Take 1 tablet (3.125 mg total) by mouth 2 (two) times daily. 180 tablet 3  . Cholecalciferol (VITAMIN D) 2000 units CAPS Take 2,000 Units by mouth daily.     Marland Kitchen doxycycline (PERIOSTAT) 20 MG tablet TAKE 1 TABLET (20 MG TOTAL) BY MOUTH TWICE A DAY 180 tablet 3  . losartan (COZAAR) 50 MG tablet Take 1 tablet (50 mg total) by mouth daily. 90 tablet 3  .  magnesium oxide (MAG-OX) 400 MG tablet Take 400 mg by mouth at bedtime.     . methocarbamol (ROBAXIN) 500 MG tablet Take 1 tablet (500 mg total) by mouth every 6 (six) hours as needed for muscle spasms. 60 tablet 1  . mometasone (NASONEX) 50 MCG/ACT nasal spray PLACE 2 SPRAYS INTO THE NOSE DAILY. 17 g 2  . Multiple Vitamin (MULTIVITAMIN WITH MINERALS) TABS Take 1 tablet by mouth daily. Centrum    . Omega-3 Fatty Acids (FISH OIL) 1200 MG CAPS Take 1,200 mg by mouth daily.     Marland Kitchen PATADAY 0.2 % SOLN PLACE 1 DROP IN EACH EYE AS NEEDED FOR DRY/ITCHY EYES OR ALLERGIES 7.5 mL 3  . Probiotic Product (PROBIOTIC PO) Take 1 capsule by mouth daily.    Marland Kitchen tretinoin (RETIN-A) 0.05 % cream APPLY AT BEDTIME TO FACE     No current facility-administered medications on file prior to visit.     Review of Systems:  As per HPI- otherwise negative.   Physical Examination: There were no vitals filed for this visit. There were no vitals filed for this visit. There is no height or weight on file to calculate BMI. Ideal Body Weight:    Her BP today was 112/75, pulse 75 per home readings  Pulse  Readings from Last 3 Encounters:  05/19/19 69  09/10/18 61  09/03/18 75   Pt observed over video- she looks well  No cough or wheezing   Assessment and Plan:   ICD-10-CM   1. SOB (shortness of breath)  R06.02 Novel Coronavirus, NAA ([facility])  2. Shortness of breath  R06.02 CT ANGIO CHEST PE W OR WO CONTRAST  3. History of DVT (deep vein thrombosis)  Z86.718 CT ANGIO CHEST PE W OR WO CONTRAST   Virtual visit with Tiffany Parker today for recent COVID-19 diagnosis. She felt pretty bad yesterday, went to urgent care where she had a chest x-ray.  She was diagnosed with pneumonia, started on azithromycin and given steroids.  She is now feeling somewhat better However she does worry about her shortness of breath, she has had a DVT in the past.  This was related to an operation. No current calf pain or swelling We discussed doing a d-dimer, but I am afraid this will be positive regardless.  She would like to proceed with a CT angiogram She is advised to go to the ER if any worrisome worsening or distress   I did attempt to order an outpatient CT angiogram, however due to her having current covid infection we are not able to make this tonight- would have to be done through the ER waiting room  Call patient back to discuss this- she wants to go forward with CT, will work on it for her    Follow-up: No follow-ups on file.  No orders of the defined types were placed in this encounter.  Orders Placed This Encounter  Procedures  . Novel Coronavirus, NAA ([facility])  . CT ANGIO CHEST PE W OR WO CONTRAST    @SIGN @    Signed Lamar Blinks, MD

## 2019-05-21 ENCOUNTER — Other Ambulatory Visit: Payer: Self-pay | Admitting: Family Medicine

## 2019-05-21 ENCOUNTER — Ambulatory Visit (HOSPITAL_COMMUNITY)
Admission: RE | Admit: 2019-05-21 | Discharge: 2019-05-21 | Disposition: A | Discharge status: Home / Self Care | Payer: 59 | Source: Ambulatory Visit | Attending: Family Medicine | Admitting: Family Medicine

## 2019-05-21 ENCOUNTER — Other Ambulatory Visit: Payer: Self-pay

## 2019-05-21 ENCOUNTER — Encounter: Payer: Self-pay | Admitting: Family Medicine

## 2019-05-21 DIAGNOSIS — R0602 Shortness of breath: Secondary | ICD-10-CM | POA: Diagnosis not present

## 2019-05-21 MED ORDER — IOHEXOL 350 MG/ML SOLN
100.0000 mL | Freq: Once | INTRAVENOUS | Status: AC | PRN
  Administered 2019-05-21: 100 mL via INTRAVENOUS

## 2019-05-21 NOTE — Progress Notes (Signed)
Ordered CT angio for her at the [facility].  Apparently she needs to go to the ER and check in- not be seen- and they can take her for her CT scan.    Called pt and let her know this plan  She denies any chance of current pregnancy

## 2019-05-22 ENCOUNTER — Encounter: Payer: Self-pay | Admitting: Family Medicine

## 2019-05-22 ENCOUNTER — Encounter (INDEPENDENT_AMBULATORY_CARE_PROVIDER_SITE_OTHER): Payer: Self-pay

## 2019-05-22 NOTE — Telephone Encounter (Signed)
I called pt yesterday about 4pm and went over her CT report.  Copy to patient today  JC

## 2019-05-25 ENCOUNTER — Encounter (INDEPENDENT_AMBULATORY_CARE_PROVIDER_SITE_OTHER): Payer: Self-pay

## 2019-05-27 ENCOUNTER — Encounter: Payer: Self-pay | Admitting: Family Medicine

## 2019-05-29 ENCOUNTER — Encounter (INDEPENDENT_AMBULATORY_CARE_PROVIDER_SITE_OTHER): Payer: Self-pay

## 2019-05-31 ENCOUNTER — Encounter (INDEPENDENT_AMBULATORY_CARE_PROVIDER_SITE_OTHER): Payer: Self-pay

## 2019-06-02 MED FILL — LOSARTAN POTASSIUM 50 MG TA: 50 | 30 days supply | Qty: 30 | Fill #3

## 2019-06-08 MED FILL — MOMETASONE FUROATE 50 MCG S: 50 | 30 days supply | Qty: 17 | Fill #2

## 2019-06-09 MED FILL — DOXYCYCLINE HYC 100 MG CAPS: 100 | 30 days supply | Qty: 60 | Fill #1

## 2019-06-30 MED FILL — LOSARTAN POTASSIUM 50 MG TA: 50 | 30 days supply | Qty: 30 | Fill #4

## 2019-07-14 MED FILL — DOXYCYCLINE HYC 100 MG CAPS: 100 | 30 days supply | Qty: 60 | Fill #2

## 2019-07-23 MED FILL — CARVEDILOL 3.125 MG TABLET: 3.125 | 90 days supply | Qty: 180 | Fill #3

## 2019-07-27 MED FILL — LOSARTAN POTASSIUM 50 MG TA: 50 | 30 days supply | Qty: 30 | Fill #5

## 2019-08-11 ENCOUNTER — Other Ambulatory Visit: Payer: Self-pay | Admitting: Family Medicine

## 2019-08-11 MED FILL — DOXYCYCLINE HYC 100 MG CAPS: 100 | 30 days supply | Qty: 60 | Fill #3

## 2019-08-11 MED FILL — MOMETASONE FUROATE 50 MCG S: 50 | 30 days supply | Qty: 17 | Fill #0

## 2019-08-25 MED FILL — LOSARTAN POTASSIUM 50 MG TA: 50 | 30 days supply | Qty: 30 | Fill #6

## 2019-09-14 ENCOUNTER — Other Ambulatory Visit: Payer: Self-pay

## 2019-09-14 ENCOUNTER — Encounter: Payer: Self-pay | Admitting: Cardiology

## 2019-09-14 ENCOUNTER — Ambulatory Visit (INDEPENDENT_AMBULATORY_CARE_PROVIDER_SITE_OTHER): Payer: 59 | Admitting: Cardiology

## 2019-09-14 VITALS — BP 104/70 | HR 75 | Ht 60.5 in | Wt 144.8 lb

## 2019-09-14 DIAGNOSIS — I420 Dilated cardiomyopathy: Secondary | ICD-10-CM

## 2019-09-14 DIAGNOSIS — R0600 Dyspnea, unspecified: Secondary | ICD-10-CM | POA: Diagnosis not present

## 2019-09-14 DIAGNOSIS — Z8619 Personal history of other infectious and parasitic diseases: Secondary | ICD-10-CM | POA: Diagnosis not present

## 2019-09-14 DIAGNOSIS — R0609 Other forms of dyspnea: Secondary | ICD-10-CM | POA: Insufficient documentation

## 2019-09-14 DIAGNOSIS — E785 Hyperlipidemia, unspecified: Secondary | ICD-10-CM

## 2019-09-14 DIAGNOSIS — Z8616 Personal history of COVID-19: Secondary | ICD-10-CM | POA: Insufficient documentation

## 2019-09-14 MED FILL — DOXYCYCLINE HYC 100 MG CAPS: 100 | 30 days supply | Qty: 60 | Fill #4

## 2019-09-14 NOTE — Patient Instructions (Signed)
Medication Instructions:  Your physician recommends that you continue on your current medications as directed. Please refer to the Current Medication list given to you today.  *If you need a refill on your cardiac medications before your next appointment, please call your pharmacy*  Lab Work: None.  If you have labs (blood work) drawn today and your tests are completely normal, you will receive your results only by: . MyChart Message (if you have MyChart) OR . A paper copy in the mail If you have any lab test that is abnormal or we need to change your treatment, we will call you to review the results.  Testing/Procedures: Your physician has requested that you have an echocardiogram. Echocardiography is a painless test that uses sound waves to create images of your heart. It provides your doctor with information about the size and shape of your heart and how well your heart's chambers and valves are working. This procedure takes approximately one hour. There are no restrictions for this procedure.    Follow-Up: At [facility], you and your health needs are our priority.  As part of our continuing mission to provide you with exceptional heart care, we have created designated Provider Care Teams.  These Care Teams include your primary Cardiologist (physician) and Advanced Practice Providers (APPs -  Physician Assistants and Nurse Practitioners) who all work together to provide you with the care you need, when you need it.  Your next appointment:   5 months  The format for your next appointment:   In Person  Provider:   Robert Krasowski, MD  Other Instructions   Echocardiogram An echocardiogram is a procedure that uses painless sound waves (ultrasound) to produce an image of the heart. Images from an echocardiogram can provide important information about:  Signs of coronary artery disease (CAD).  Aneurysm detection. An aneurysm is a weak or damaged part of an artery wall that  bulges out from the normal force of blood pumping through the body.  Heart size and shape. Changes in the size or shape of the heart can be associated with certain conditions, including heart failure, aneurysm, and CAD.  Heart muscle function.  Heart valve function.  Signs of a past heart attack.  Fluid buildup around the heart.  Thickening of the heart muscle.  A tumor or infectious growth around the heart valves. Tell a health care provider about:  Any allergies you have.  All medicines you are taking, including vitamins, herbs, eye drops, creams, and over-the-counter medicines.  Any blood disorders you have.  Any surgeries you have had.  Any medical conditions you have.  Whether you are pregnant or may be pregnant. What are the risks? Generally, this is a safe procedure. However, problems may occur, including:  Allergic reaction to dye (contrast) that may be used during the procedure. What happens before the procedure? No specific preparation is needed. You may eat and drink normally. What happens during the procedure?   An IV tube may be inserted into one of your veins.  You may receive contrast through this tube. A contrast is an injection that improves the quality of the pictures from your heart.  A gel will be applied to your chest.  A wand-like tool (transducer) will be moved over your chest. The gel will help to transmit the sound waves from the transducer.  The sound waves will harmlessly bounce off of your heart to allow the heart images to be captured in real-time motion. The images will be recorded   on a computer. The procedure may vary among health care providers and hospitals. What happens after the procedure?  You may return to your normal, everyday life, including diet, activities, and medicines, unless your health care provider tells you not to do that. Summary  An echocardiogram is a procedure that uses painless sound waves (ultrasound) to produce  an image of the heart.  Images from an echocardiogram can provide important information about the size and shape of your heart, heart muscle function, heart valve function, and fluid buildup around your heart.  You do not need to do anything to prepare before this procedure. You may eat and drink normally.  After the echocardiogram is completed, you may return to your normal, everyday life, unless your health care provider tells you not to do that. This information is not intended to replace advice given to you by your health care provider. Make sure you discuss any questions you have with your health care provider. Document Released: 10/12/2000 Document Revised: 02/05/2019 Document Reviewed: 11/17/2016 Elsevier Patient Education  2020 Elsevier Inc.   

## 2019-09-14 NOTE — Progress Notes (Signed)
Cardiology Office Note:    Date:  09/14/2019   ID:  Tiffany Parker, DOB Feb 04, 1966, MRN [id number]  PCP:  [name], MD  Cardiologist:  [name], MD    Referring MD: [name], MD   Chief Complaint  Patient presents with  . Follow-up  Doing better  History of Present Illness:    Tiffany Parker is a 53 y.o. female with past medical history significant for cardiomyopathy in October 2018 her echocardiogram showed ejection fraction 35 to 40% however year later was improvement to 45 to 50%, cardiac catheterization was done at that time showing normal coronaries.  She is following up today on this condition.  In the meantime in the summer she ended up having [id number] infection complicated by pneumonia and was sick for few weeks but now she thinks she is back to normal.  She stopped exercising on the regular basis like she used to do and she gained some weight.  Still complain of having some shortness of breath but no proximal nocturnal dyspnea no palpitations no dizziness no swelling of lower extremities.  Past Medical History:  Diagnosis Date  . ADHD (attention deficit hyperactivity disorder)   . Allergy   . Arthritis   . Blood transfusion    post delivery  . Blood transfusion without reported diagnosis   . DVT (deep venous thrombosis) ([place]) 04/2012   following spine surgery - right leg  . Headache(784.0)   . PONV (postoperative nausea and vomiting)   . TMJ (dislocation of temporomandibular joint)     Past Surgical History:  Procedure Laterality Date  . ANTERIOR CERVICAL DECOMP/DISCECTOMY FUSION  05/05/2012   Procedure: ANTERIOR CERVICAL DECOMPRESSION/DISCECTOMY FUSION 1 LEVEL;  Surgeon: Elaina Hoops, MD;  Location: [place] [facility];  Service: Neurosurgery;  Laterality: N/A;  Cervical four-five anterior cervical decompression with fusion interbody prothesis plating and bonegraft  . ANTERIOR CERVICAL DECOMP/DISCECTOMY FUSION N/A 05/03/2014   Procedure: CERVICAL  FIVE TO SIX, CERVICAL SIX TO SEVEN ANTERIOR CERVICAL DECOMPRESSION/DISCECTOMY FUSION 2 LEVELS;  Surgeon: Elaina Hoops, MD;  Location: [place] [facility];  Service: Neurosurgery;  Laterality: N/A;  . CESAREAN SECTION    . CHOLECYSTECTOMY    . episiotomy   11/19/97  . episiotomy repair  11/1997  . EYE SURGERY    . LEFT HEART CATH AND CORONARY ANGIOGRAPHY N/A 09/26/2017   Procedure: LEFT HEART CATH AND CORONARY ANGIOGRAPHY;  Surgeon: Belva Crome, MD;  Location: [place] [facility];  Service: Cardiovascular;  Laterality: N/A;    Current Medications: Current Meds  Medication Sig  . ACZONE 5 % topical gel APPLY TOPICALLY 2 TIMES DAILY. (Patient taking differently: APPLY TOPICALLY ONCE TO TWICE  DAILY.)  . aspirin-acetaminophen-caffeine (EXCEDRIN MIGRAINE) 250-250-65 MG per tablet Take 1 tablet by mouth daily as needed for headache.   . B Complex-C (B-COMPLEX WITH VITAMIN C) tablet Take 1 tablet by mouth daily.  Marland Kitchen BIOTIN PO Take 1 tablet by mouth daily.  . Calcium Carbonate-Vitamin D (CALCIUM 600+D) 600-400 MG-UNIT per tablet Take 2 tablets by mouth at bedtime.   . carvedilol (COREG) 3.125 MG tablet Take 1 tablet (3.125 mg total) by mouth 2 (two) times daily.  . Cholecalciferol (VITAMIN D) 2000 units CAPS Take 2,000 Units by mouth daily.   Marland Kitchen doxycycline (PERIOSTAT) 20 MG tablet TAKE 1 TABLET (20 MG TOTAL) BY MOUTH TWICE A DAY  . losartan (COZAAR) 50 MG tablet Take 1 tablet (50 mg total) by mouth daily.  . magnesium oxide (  MAG-OX) 400 MG tablet Take 400 mg by mouth at bedtime.   . methocarbamol (ROBAXIN) 500 MG tablet Take 1 tablet (500 mg total) by mouth every 6 (six) hours as needed for muscle spasms.  . mometasone (NASONEX) 50 MCG/ACT nasal spray PLACE 2 SPRAYS INTO THE NOSE DAILY.  . Multiple Vitamin (MULTIVITAMIN WITH MINERALS) TABS Take 1 tablet by mouth daily. Centrum  . Omega-3 Fatty Acids (FISH OIL) 1200 MG CAPS Take 1,200 mg by mouth daily.   Marland Kitchen PATADAY 0.2 % SOLN PLACE 1 DROP IN EACH EYE AS  NEEDED FOR DRY/ITCHY EYES OR ALLERGIES  . Probiotic Product (PROBIOTIC PO) Take 1 capsule by mouth daily.  Marland Kitchen tretinoin (RETIN-A) 0.05 % cream APPLY AT BEDTIME TO FACE     Allergies:   Other, Prenatal vitamins, Septra [bactrim], and Sulfamethoxazole-trimethoprim   Social History   Socioeconomic History  . Marital status: Married    Spouse name: Not on file  . Number of children: Not on file  . Years of education: Not on file  . Highest education level: Not on file  Occupational History  . Occupation: Programmer, multimedia: [employer]  . Financial resource strain: Not on file  . Food insecurity    Worry: Not on file    Inability: Not on file  . Transportation needs    Medical: Not on file    Non-medical: Not on file  Tobacco Use  . Smoking status: Never Smoker  . Smokeless tobacco: Never Used  Substance and Sexual Activity  . Alcohol use: Yes    Comment: socially  . Drug use: No  . Sexual activity: Yes  Lifestyle  . Physical activity    Days per week: Not on file    Minutes per session: Not on file  . Stress: Not on file  Relationships  . Social Herbalist on phone: Not on file    Gets together: Not on file    Attends religious service: Not on file    Active member of club or organization: Not on file    Attends meetings of clubs or organizations: Not on file    Relationship status: Not on file  Other Topics Concern  . Not on file  Social History Narrative   Married. Education: [employer]. Exercise: Aerobics 3 days a week for 40 minutes.     Family History: The patient's family history includes Cancer in her maternal grandfather and mother; Diabetes in her maternal grandmother; Heart disease in her paternal grandmother; Hyperlipidemia in her paternal grandmother; Hypertension in her father and paternal grandmother; Hypothyroidism in her father; Stroke in her maternal grandmother and paternal grandfather. There is no history of Osteoporosis. ROS:    Please see the history of present illness.    All 14 point review of systems negative except as described per history of present illness  EKGs/Labs/Other Studies Reviewed:      Recent Labs: No results found for requested labs within last 8760 hours.  Recent Lipid Panel    Component Value Date/Time   CHOL 186 09/10/2018 1132   TRIG 65.0 09/10/2018 1132   HDL 72.20 09/10/2018 1132   CHOLHDL 3 09/10/2018 1132   VLDL 13.0 09/10/2018 1132   LDLCALC 101 (H) 09/10/2018 1132    Physical Exam:    VS:  BP 104/70   Pulse 75   Ht 5' 0.5" (1.537 m)   Wt 144 lb 12.8 oz (65.7 kg)   SpO2 99%  BMI 27.81 kg/m     Wt Readings from Last 3 Encounters:  09/14/19 144 lb 12.8 oz (65.7 kg)  09/10/18 135 lb 9.6 oz (61.5 kg)  09/03/18 135 lb 6.4 oz (61.4 kg)     GEN:  Well nourished, well developed in no acute distress HEENT: Normal NECK: No JVD; No carotid bruits LYMPHATICS: No lymphadenopathy CARDIAC: RRR, no murmurs, no rubs, no gallops RESPIRATORY:  Clear to auscultation without rales, wheezing or rhonchi  ABDOMEN: Soft, non-tender, non-distended MUSCULOSKELETAL:  No edema; No deformity  SKIN: Warm and dry LOWER EXTREMITIES: no swelling NEUROLOGIC:  Alert and oriented x 3 PSYCHIATRIC:  Normal affect   ASSESSMENT:    1. Dilated cardiomyopathy ([place])   2. Dyspnea on exertion   3. Dyslipidemia   4. History of 2019 novel coronavirus disease (COVID-19)    PLAN:    In order of problems listed above:  1. History of dilated cardiomyopathy we will repeat echocardiogram to check left ventricle ejection fraction she is on beta-blocker and losartan which I will continue for now. 2. Dyspnea exertion a little bit improving however difficult to judge if it is related to pneumonia that she got a sequela of COVID-19 infection or related to her heart obviously the most important thing is to do echocardiogram will wait for results of this to make recommendations. 3. Dyslipidemia her LDL is 101  however her HDL is more than 70 this is from year ago will make arrangements for another fasting lipid profile. 4. History of COVID-19 infection recovering.   Medication Adjustments/Labs and Tests Ordered: Current medicines are reviewed at length with the patient today.  Concerns regarding medicines are outlined above.  No orders of the defined types were placed in this encounter.  Medication changes: No orders of the defined types were placed in this encounter.   Signed, Park Liter, MD, [facility] 09/14/2019 8:47 AM    [place]

## 2019-09-14 NOTE — Addendum Note (Signed)
Addended by: Ashok Norris on: 09/14/2019 08:53 AM   Modules accepted: Orders

## 2019-09-14 NOTE — Addendum Note (Signed)
Addended by: Ashok Norris on: 09/14/2019 10:09 AM   Modules accepted: Orders

## 2019-09-16 ENCOUNTER — Ambulatory Visit (HOSPITAL_BASED_OUTPATIENT_CLINIC_OR_DEPARTMENT_OTHER)
Admission: RE | Admit: 2019-09-16 | Discharge: 2019-09-16 | Disposition: A | Discharge status: Home / Self Care | Payer: 59 | Source: Ambulatory Visit | Attending: Cardiology | Admitting: Cardiology

## 2019-09-16 ENCOUNTER — Other Ambulatory Visit: Payer: Self-pay

## 2019-09-16 DIAGNOSIS — R0600 Dyspnea, unspecified: Secondary | ICD-10-CM | POA: Insufficient documentation

## 2019-09-16 NOTE — Progress Notes (Signed)
  Echocardiogram 2D Echocardiogram has been performed.  Tiffany Parker 09/16/2019, 10:22 AM

## 2019-09-21 ENCOUNTER — Telehealth: Payer: Self-pay | Admitting: Cardiology

## 2019-09-21 NOTE — Telephone Encounter (Signed)
Called patient informed her of echo results. She verbally understood. No further questions.

## 2019-09-21 NOTE — Telephone Encounter (Signed)
Wants echo results °

## 2019-10-02 ENCOUNTER — Other Ambulatory Visit: Payer: Self-pay | Admitting: Cardiology

## 2019-10-02 ENCOUNTER — Telehealth: Payer: Self-pay | Admitting: Cardiology

## 2019-10-02 MED ORDER — LOSARTAN POTASSIUM 50 MG PO TABS: 50.0000 mg | ORAL_TABLET | Freq: Every day | ORAL | 1 refills | Status: DC

## 2019-10-02 MED FILL — LOSARTAN POTASSIUM 50 MG TA: 50 | 90 days supply | Qty: 90 | Fill #0

## 2019-10-02 NOTE — Telephone Encounter (Signed)
Losartan 50 mg daily refilled.

## 2019-10-02 NOTE — Telephone Encounter (Signed)
Call losartin [facility]

## 2019-10-13 MED FILL — DOXYCYCLINE HYC 100 MG CAPS: 100 | 30 days supply | Qty: 60 | Fill #5

## 2019-10-20 ENCOUNTER — Other Ambulatory Visit: Payer: Self-pay | Admitting: Cardiology

## 2019-10-20 MED FILL — CARVEDILOL 3.125 MG TABLET: 3.125 | 90 days supply | Qty: 180 | Fill #0

## 2019-10-20 NOTE — Telephone Encounter (Signed)
Rx refill sent to pharmacy. 

## 2019-11-17 MED FILL — DOXYCYCLINE HYC 100 MG CAPS: 100 | 30 days supply | Qty: 60 | Fill #0

## 2019-11-24 DIAGNOSIS — E559 Vitamin D deficiency, unspecified: Secondary | ICD-10-CM | POA: Diagnosis not present

## 2019-11-24 DIAGNOSIS — R87810 Cervical high risk human papillomavirus (HPV) DNA test positive: Secondary | ICD-10-CM | POA: Diagnosis not present

## 2019-11-24 DIAGNOSIS — Z0000 Encounter for general adult medical examination without abnormal findings: Secondary | ICD-10-CM | POA: Diagnosis not present

## 2019-11-24 DIAGNOSIS — Z01419 Encounter for gynecological examination (general) (routine) without abnormal findings: Secondary | ICD-10-CM | POA: Diagnosis not present

## 2019-11-24 DIAGNOSIS — Z1151 Encounter for screening for human papillomavirus (HPV): Secondary | ICD-10-CM | POA: Diagnosis not present

## 2019-12-18 MED FILL — DOXYCYCLINE HYC 100 MG CAPS: 100 | 30 days supply | Qty: 60 | Fill #0

## 2019-12-24 MED FILL — LOSARTAN POTASSIUM 50 MG TA: 50 | 90 days supply | Qty: 90 | Fill #0

## 2020-01-14 MED FILL — DOXYCYCLINE HYC 100 MG CAPS: 100 | 30 days supply | Qty: 60 | Fill #1

## 2020-01-14 MED FILL — CARVEDILOL 3.125 MG TABLET: 3.125 | 90 days supply | Qty: 180 | Fill #0

## 2020-01-14 MED FILL — MOMETASONE FUROATE 50 MCG S: 50 | 30 days supply | Qty: 17 | Fill #0

## 2020-02-15 MED FILL — DOXYCYCLINE HYC 100 MG CAPS: 100 | 30 days supply | Qty: 60 | Fill #0

## 2020-03-08 NOTE — Progress Notes (Addendum)
[place] [facility] [address] [place], [place] [address] [phone]  Date:  03/10/2020   Name:  Tiffany Parker   DOB:  January 29, 1966   MRN:  [id number]  PCP:  Tiffany Mclean, MD    Chief Complaint: Annual Exam   History of Present Illness:  Tiffany Parker is a 54 y.o. 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 Left ventricular ejection fraction, by visual estimation, is 50 to  55%. The left ventricle has normal function. There is no left ventricular  hypertrophy.  2. Global right ventricle has normal systolic function.The right  ventricular size is normal. No increase in right ventricular wall  thickness.  3. Left atrial size was normal.  4. Tricuspid valve regurgitation is mild.   She had covid 19 last year - she still cannot taste and smell normally.   She notes a bit more difficulty with concentration -she would like to go back on adderall if ok for her heart.  She has been doing well with Adderall previously, we stopped it 2 to 3 years ago because of concern about dilated cardiomyopathy  She is doing some aerobics classes and enjoying no chest pain or shortness of breath  Wt Readings from Last 3 Encounters:  03/10/20 140 lb (63.5 kg)  09/14/19 144 lb 12.8 oz (65.7 kg)  09/10/18 135 lb 9.6 oz (61.5 kg)     Mammogram- per GYN  Pap- per [name] HIV screening  shingrix done covid series Labs: now due  osteopenia 2018- can update now  cologuard 2019  Her youngest is going to graduate from [employer] this spring- he is still deciding what to do next year He is training to be a Production assistant, radio   Patient Active Problem List   Diagnosis Date Noted  .  Dyspnea on exertion 09/14/2019  . Dyslipidemia 09/14/2019  . History of 2019 novel coronavirus disease (COVID-19) 09/14/2019  . Lower abdominal pain 06/13/2018  . Hx of LASIK 01/09/2018  . Dilated cardiomyopathy ([place]) 09/16/2017  . Abnormal stress test 08/28/2017  . Atypical chest pain 08/28/2017  . Low bone mass 07/09/2016  . Vitamin D deficiency 07/09/2016  . Osteopenia determined by x-ray 10/13/2015  . Spinal stenosis of cervical region 05/03/2014  . 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 ADHD (attention deficit hyperactivity disorder) 12/22/2012  . Acne 12/22/2012  . Plantar fasciitis 10/11/2012  . DVT (deep venous thrombosis) ([place]) 05/07/2012  . History of cholecystectomy 01/27/2005  . Cesarean delivery due to previous difficult delivery, delivered, current hospitalization 09/29/2001  . Episiotomy dehiscence 11/27/1997    Past Medical History:  Diagnosis Date  . ADHD (attention deficit hyperactivity disorder)   . Allergy   . Arthritis   . Blood transfusion    post delivery  . Blood transfusion without reported diagnosis   . DVT (deep venous thrombosis) ([place]) 04/2012   following spine surgery - right leg  . Headache(784.0)   . PONV (postoperative nausea and vomiting)   . TMJ (dislocation of temporomandibular joint)     Past Surgical History:  Procedure Laterality Date  . ANTERIOR CERVICAL DECOMP/DISCECTOMY FUSION  05/05/2012   Procedure: ANTERIOR CERVICAL DECOMPRESSION/DISCECTOMY  FUSION 1 LEVEL;  Surgeon: Elaina Hoops, MD;  Location: [facility];  Service: Neurosurgery;  Laterality: N/A;  Cervical four-five anterior cervical decompression with fusion interbody prothesis plating and bonegraft  . ANTERIOR CERVICAL DECOMP/DISCECTOMY FUSION N/A 05/03/2014   Procedure: CERVICAL FIVE TO SIX, CERVICAL SIX TO SEVEN ANTERIOR CERVICAL DECOMPRESSION/DISCECTOMY FUSION 2 LEVELS;  Surgeon: Elaina Hoops, MD;  Location: [facility];  Service: Neurosurgery;  Laterality: N/A;  . CESAREAN  SECTION    . CHOLECYSTECTOMY    . episiotomy   11/19/97  . episiotomy repair  11/1997  . EYE SURGERY    . LEFT HEART CATH AND CORONARY ANGIOGRAPHY N/A 09/26/2017   Procedure: LEFT HEART CATH AND CORONARY ANGIOGRAPHY;  Surgeon: Belva Crome, MD;  Location: [facility];  Service: Cardiovascular;  Laterality: N/A;    Social History   Tobacco Use  . Smoking status: Never Smoker  . Smokeless tobacco: Never Used  Substance Use Topics  . Alcohol use: Yes    Comment: socially  . Drug use: No    Family History  Problem Relation Age of Onset  . Cancer Mother   . Hypertension Father   . Hypothyroidism Father   . Diabetes Maternal Grandmother   . Stroke Maternal Grandmother   . Cancer Maternal Grandfather        lung  . Hypertension Paternal Grandmother   . Hyperlipidemia Paternal Grandmother   . Heart disease Paternal Grandmother   . Stroke Paternal Grandfather   . Osteoporosis Neg Hx     Allergies  Allergen Reactions  . Other Other (See Comments)    Anesthesia used in July 2013 caused nausea and vomiting  . Prenatal Vitamins Hives    Patient does not remember the brand of the prenatal vitamin that caused her to have issues.   . Septra [Bactrim] Hives  . Sulfamethoxazole-Trimethoprim Hives    Medication list has been reviewed and updated.  Current Outpatient Medications on File Prior to Visit  Medication Sig Dispense Refill  . ACZONE 5 % topical gel APPLY TOPICALLY 2 TIMES DAILY. (Patient taking differently: APPLY TOPICALLY ONCE TO TWICE  DAILY.) 180 g 3  . aspirin-acetaminophen-caffeine (EXCEDRIN MIGRAINE) [id number] MG per tablet Take 1 tablet by mouth daily as needed for headache.     . B Complex-C (B-COMPLEX WITH VITAMIN C) tablet Take 1 tablet by mouth daily.    Marland Kitchen BIOTIN PO Take 1 tablet by mouth daily.    . Calcium Carbonate-Vitamin D (CALCIUM 600+D) 600-400 MG-UNIT per tablet Take 2 tablets by mouth at bedtime.     . carvedilol (COREG) 3.125 MG tablet TAKE 1  TABLET BY MOUTH 2 TIMES DAILY. 180 tablet 3  . Cholecalciferol (VITAMIN D) 2000 units CAPS Take 2,000 Units by mouth daily.     Marland Kitchen doxycycline (PERIOSTAT) 20 MG tablet TAKE 1 TABLET (20 MG TOTAL) BY MOUTH TWICE A DAY 180 tablet 3  . magnesium oxide (MAG-OX) 400 MG tablet Take 400 mg by mouth at bedtime.     . methocarbamol (ROBAXIN) 500 MG tablet Take 1 tablet (500 mg total) by mouth every 6 (six) hours as needed for muscle spasms. 60 tablet 1  . mometasone (NASONEX) 50 MCG/ACT nasal spray PLACE 2 SPRAYS INTO THE NOSE DAILY. 17 g 2  . Multiple Vitamin (MULTIVITAMIN WITH MINERALS) TABS Take 1 tablet by mouth daily. Centrum    . Omega-3 Fatty Acids (FISH OIL) 1200 MG CAPS Take 1,200 mg by mouth daily.     Marland Kitchen  PATADAY 0.2 % SOLN PLACE 1 DROP IN EACH EYE AS NEEDED FOR DRY/ITCHY EYES OR ALLERGIES 7.5 mL 3  . Probiotic Product (PROBIOTIC PO) Take 1 capsule by mouth daily.    Marland Kitchen tretinoin (RETIN-A) 0.05 % cream APPLY AT BEDTIME TO FACE    . losartan (COZAAR) 50 MG tablet Take 1 tablet (50 mg total) by mouth daily. 90 tablet 1   No current facility-administered medications on file prior to visit.    Review of Systems:  As per HPI- otherwise negative.   Physical Examination: Vitals:   03/10/20 0853  BP: 115/81  Pulse: 67  Resp: 15  Temp: 98.3 F (36.8 C)  SpO2: 100%   Vitals:   03/10/20 0853  Weight: 140 lb (63.5 kg)  Height: 5' 0.5" (1.537 m)   Body mass index is 26.89 kg/m. Ideal Body Weight: Weight in (lb) to have BMI = 25: 129.9  GEN: no acute distress.  Looks well, normal weight HEENT: Atraumatic, Normocephalic.  Ears and Nose: No external deformity. CV: RRR, No M/G/R. No JVD. No thrill. No extra heart sounds. PULM: CTA B, no wheezes, crackles, rhonchi. No retractions. No resp. distress. No accessory muscle use. ABD: S, NT, ND. No rebound. No HSM. EXTR: No c/c/e PSYCH: Normally interactive. Conversant.    Assessment and Plan: Physical exam  Screening for diabetes mellitus  - Plan: Comprehensive metabolic panel, Hemoglobin A1c  Screening for hyperlipidemia - Plan: Lipid panel  Rosacea  Screening for deficiency anemia - Plan: CBC  Screening for thyroid disorder - Plan: TSH  Osteopenia, unspecified location - Plan: DG Bone Density  Essential hypertension - Plan: losartan (COZAAR) 50 MG tablet  Allergic rhinitis, unspecified seasonality, unspecified trigger - Plan: PATADAY 0.2 % SOLN, mometasone (NASONEX) 50 MCG/ACT nasal spray   Here today for a physical exam and follow-up.  Labs pending as above, will be in touch with patient results Ordered bone density Refill medications Will plan further follow- up pending labs.  This visit occurred during the SARS-CoV-2 public health emergency.  Safety protocols were in place, including screening questions prior to the visit, additional usage of staff PPE, and extensive cleaning of exam room while observing appropriate contact time as indicated for disinfecting solutions.   Encouraged healthy diet, exercise, alcohol and tobacco avoidance  Signed Lamar Blinks, MD  Received her labs, also touch base with cardiology who felt re-start stimulant ok for her  Results for orders placed or performed in visit on 03/10/20  CBC  Result Value Ref Range   WBC 6.8 4.0 - 10.5 K/uL   RBC 4.70 3.87 - 5.11 Mil/uL   Platelets 226.0 150.0 - 400.0 K/uL   Hemoglobin 13.5 12.0 - 15.0 g/dL   HCT 40.4 36.0 - 46.0 %   MCV 85.9 78.0 - 100.0 fl   MCHC 33.5 30.0 - 36.0 g/dL   RDW 13.1 11.5 - 15.5 %  Comprehensive metabolic panel  Result Value Ref Range   Sodium 139 135 - 145 mEq/L   Potassium 4.9 3.5 - 5.1 mEq/L   Chloride 104 96 - 112 mEq/L   CO2 30 19 - 32 mEq/L   Glucose, Bld 102 (H) 70 - 99 mg/dL   BUN 25 (H) 6 - 23 mg/dL   Creatinine, Ser 0.62 0.40 - 1.20 mg/dL   Total Bilirubin 0.4 0.2 - 1.2 mg/dL   Alkaline Phosphatase 107 39 - 117 U/L   AST 17 0 - 37 U/L   ALT 12 0 - 35 U/L  Total Protein 7.0 6.0 - 8.3 g/dL    Albumin 4.5 3.5 - 5.2 g/dL   GFR 101.19 >60.00 mL/min   Calcium 9.5 8.4 - 10.5 mg/dL  Hemoglobin A1c  Result Value Ref Range   Hgb A1c MFr Bld 5.7 4.6 - 6.5 %  Lipid panel  Result Value Ref Range   Cholesterol 181 0 - 200 mg/dL   Triglycerides 103.0 0.0 - 149.0 mg/dL   HDL 61.40 >39.00 mg/dL   VLDL 20.6 0.0 - 40.0 mg/dL   LDL Cholesterol 99 0 - 99 mg/dL   Total CHOL/HDL Ratio 3    NonHDL 119.59   TSH  Result Value Ref Range   TSH 2.13 0.35 - 4.50 uIU/mL   Message to pt

## 2020-03-08 NOTE — Patient Instructions (Addendum)
It was great to see you again today, I will be in touch with your labs asap We will get a bone density scan for you at your convenience BP looks good on current regimen I would encourage you to have your covid 19 series at your earliest convenience I will touch base with Dr Raliegh Ip about your going back on adderall- will let you know what he says     Health Maintenance, Female Adopting a healthy lifestyle and getting preventive care are important in promoting health and wellness. Ask your health care provider about:  The right schedule for you to have regular tests and exams.  Things you can do on your own to prevent diseases and keep yourself healthy. What should I know about diet, weight, and exercise? Eat a healthy diet   Eat a diet that includes plenty of vegetables, fruits, low-fat dairy products, and lean protein.  Do not eat a lot of foods that are high in solid fats, added sugars, or sodium. Maintain a healthy weight Body mass index (BMI) is used to identify weight problems. It estimates body fat based on height and weight. Your health care provider can help determine your BMI and help you achieve or maintain a healthy weight. Get regular exercise Get regular exercise. This is one of the most important things you can do for your health. Most adults should:  Exercise for at least 150 minutes each week. The exercise should increase your heart rate and make you sweat (moderate-intensity exercise).  Do strengthening exercises at least twice a week. This is in addition to the moderate-intensity exercise.  Spend less time sitting. Even light physical activity can be beneficial. Watch cholesterol and blood lipids Have your blood tested for lipids and cholesterol at 52 years of age, then have this test every 5 years. Have your cholesterol levels checked more often if:  Your lipid or cholesterol levels are high.  You are older than 52 years of age.  You are at high risk for heart  disease. What should I know about cancer screening? Depending on your health history and family history, you may need to have cancer screening at various ages. This may include screening for:  Breast cancer.  Cervical cancer.  Colorectal cancer.  Skin cancer.  Lung cancer. What should I know about heart disease, diabetes, and high blood pressure? Blood pressure and heart disease  High blood pressure causes heart disease and increases the risk of stroke. This is more likely to develop in people who have high blood pressure readings, are of African descent, or are overweight.  Have your blood pressure checked: ? Every 3-5 years if you are 63-44 years of age. ? Every year if you are 11 years old or older. Diabetes Have regular diabetes screenings. This checks your fasting blood sugar level. Have the screening done:  Once every three years after age 79 if you are at a normal weight and have a low risk for diabetes.  More often and at a younger age if you are overweight or have a high risk for diabetes. What should I know about preventing infection? Hepatitis B If you have a higher risk for hepatitis B, you should be screened for this virus. Talk with your health care provider to find out if you are at risk for hepatitis B infection. Hepatitis C Testing is recommended for:  Everyone born from [birth year] through 1963.  Anyone with known risk factors for hepatitis C. Sexually transmitted infections (STIs)  Get screened  for STIs, including gonorrhea and chlamydia, if: ? You are sexually active and are younger than 52 years of age. ? You are older than 52 years of age and your health care provider tells you that you are at risk for this type of infection. ? Your sexual activity has changed since you were last screened, and you are at increased risk for chlamydia or gonorrhea. Ask your health care provider if you are at risk.  Ask your health care provider about whether you are at high  risk for HIV. Your health care provider may recommend a prescription medicine to help prevent HIV infection. If you choose to take medicine to prevent HIV, you should first get tested for HIV. You should then be tested every 3 months for as long as you are taking the medicine. Pregnancy  If you are about to stop having your period (premenopausal) and you may become pregnant, seek counseling before you get pregnant.  Take 400 to 800 micrograms (mcg) of folic acid every day if you become pregnant.  Ask for birth control (contraception) if you want to prevent pregnancy. Osteoporosis and menopause Osteoporosis is a disease in which the bones lose minerals and strength with aging. This can result in bone fractures. If you are 69 years old or older, or if you are at risk for osteoporosis and fractures, ask your health care provider if you should:  Be screened for bone loss.  Take a calcium or vitamin D supplement to lower your risk of fractures.  Be given hormone replacement therapy (HRT) to treat symptoms of menopause. Follow these instructions at home: Lifestyle  Do not use any products that contain nicotine or tobacco, such as cigarettes, e-cigarettes, and chewing tobacco. If you need help quitting, ask your health care provider.  Do not use street drugs.  Do not share needles.  Ask your health care provider for help if you need support or information about quitting drugs. Alcohol use  Do not drink alcohol if: ? Your health care provider tells you not to drink. ? You are pregnant, may be pregnant, or are planning to become pregnant.  If you drink alcohol: ? Limit how much you use to 0-1 drink a day. ? Limit intake if you are breastfeeding.  Be aware of how much alcohol is in your drink. In the [place] one drink equals one 12 oz bottle of beer (355 mL), one 5 oz glass of wine (148 mL), or one 1 oz glass of hard liquor (44 mL). General instructions  Schedule regular health, dental,  and eye exams.  Stay current with your vaccines.  Tell your health care provider if: ? You often feel depressed. ? You have ever been abused or do not feel safe at home. Summary  Adopting a healthy lifestyle and getting preventive care are important in promoting health and wellness.  Follow your health care provider's instructions about healthy diet, exercising, and getting tested or screened for diseases.  Follow your health care provider's instructions on monitoring your cholesterol and blood pressure. This information is not intended to replace advice given to you by your health care provider. Make sure you discuss any questions you have with your health care provider. Document Revised: 10/08/2018 Document Reviewed: 10/08/2018 Elsevier Patient Education  2020 [employer].

## 2020-03-10 ENCOUNTER — Ambulatory Visit (HOSPITAL_BASED_OUTPATIENT_CLINIC_OR_DEPARTMENT_OTHER)
Admission: RE | Admit: 2020-03-10 | Discharge: 2020-03-10 | Disposition: A | Discharge status: Home / Self Care | Payer: 59 | Source: Ambulatory Visit | Attending: Family Medicine | Admitting: Family Medicine

## 2020-03-10 ENCOUNTER — Encounter: Payer: Self-pay | Admitting: Family Medicine

## 2020-03-10 ENCOUNTER — Other Ambulatory Visit: Payer: Self-pay

## 2020-03-10 ENCOUNTER — Ambulatory Visit (INDEPENDENT_AMBULATORY_CARE_PROVIDER_SITE_OTHER): Payer: 59 | Admitting: Family Medicine

## 2020-03-10 VITALS — BP 115/81 | HR 67 | Temp 98.3°F | Resp 15 | Ht 60.5 in | Wt 140.0 lb

## 2020-03-10 DIAGNOSIS — M8580 Other specified disorders of bone density and structure, unspecified site: Secondary | ICD-10-CM

## 2020-03-10 DIAGNOSIS — Z0000 Encounter for general adult medical examination without abnormal findings: Secondary | ICD-10-CM | POA: Diagnosis not present

## 2020-03-10 DIAGNOSIS — J309 Allergic rhinitis, unspecified: Secondary | ICD-10-CM | POA: Diagnosis not present

## 2020-03-10 DIAGNOSIS — Z1329 Encounter for screening for other suspected endocrine disorder: Secondary | ICD-10-CM

## 2020-03-10 DIAGNOSIS — Z131 Encounter for screening for diabetes mellitus: Secondary | ICD-10-CM | POA: Diagnosis not present

## 2020-03-10 DIAGNOSIS — Z130 Encounter for screening for diseases of the blood and blood-forming organs and certain disorders involving the immune mechanism: Secondary | ICD-10-CM

## 2020-03-10 DIAGNOSIS — I10 Essential (primary) hypertension: Secondary | ICD-10-CM | POA: Diagnosis not present

## 2020-03-10 DIAGNOSIS — L719 Rosacea, unspecified: Secondary | ICD-10-CM | POA: Diagnosis not present

## 2020-03-10 DIAGNOSIS — Z13220 Encounter for screening for lipoid disorders: Secondary | ICD-10-CM | POA: Diagnosis not present

## 2020-03-10 DIAGNOSIS — F909 Attention-deficit hyperactivity disorder, unspecified type: Secondary | ICD-10-CM

## 2020-03-10 DIAGNOSIS — Z13820 Encounter for screening for osteoporosis: Secondary | ICD-10-CM | POA: Diagnosis not present

## 2020-03-10 LAB — LIPID PANEL
Cholesterol: 181 mg/dL (ref 0–200)
HDL: 61.4 mg/dL (ref >39.00)
LDL Cholesterol: 99 mg/dL (ref 0–99)
NonHDL: 119.59
Total CHOL/HDL Ratio: 3
Triglycerides: 103 mg/dL (ref 0.0–149.0)
VLDL: 20.6 mg/dL (ref 0.0–40.0)

## 2020-03-10 LAB — CBC
HCT: 40.4 % (ref 36.0–46.0)
Hemoglobin: 13.5 g/dL (ref 12.0–15.0)
MCHC: 33.5 g/dL (ref 30.0–36.0)
MCV: 85.9 fl (ref 78.0–100.0)
Platelets: 226 10*3/uL (ref 150.0–400.0)
RBC: 4.7 Mil/uL (ref 3.87–5.11)
RDW: 13.1 % (ref 11.5–15.5)
WBC: 6.8 10*3/uL (ref 4.0–10.5)

## 2020-03-10 LAB — COMPREHENSIVE METABOLIC PANEL
ALT: 12 U/L (ref 0–35)
AST: 17 U/L (ref 0–37)
Albumin: 4.5 g/dL (ref 3.5–5.2)
Alkaline Phosphatase: 107 U/L (ref 39–117)
BUN: 25 mg/dL — ABNORMAL HIGH (ref 6–23)
CO2: 30 mEq/L (ref 19–32)
Calcium: 9.5 mg/dL (ref 8.4–10.5)
Chloride: 104 mEq/L (ref 96–112)
Creatinine, Ser: 0.62 mg/dL (ref 0.40–1.20)
GFR: 101.19 mL/min (ref >60.00)
Glucose, Bld: 102 mg/dL — ABNORMAL HIGH (ref 70–99)
Potassium: 4.9 mEq/L (ref 3.5–5.1)
Sodium: 139 mEq/L (ref 135–145)
Total Bilirubin: 0.4 mg/dL (ref 0.2–1.2)
Total Protein: 7 g/dL (ref 6.0–8.3)

## 2020-03-10 LAB — TSH: TSH: 2.13 u[IU]/mL (ref 0.35–4.50)

## 2020-03-10 LAB — HEMOGLOBIN A1C: Hgb A1c MFr Bld: 5.7 % (ref 4.6–6.5)

## 2020-03-10 MED ORDER — MOMETASONE FUROATE 50 MCG/ACT NA SUSP: 2.0000 | Freq: Every day | NASAL | 11 refills | Status: AC

## 2020-03-10 MED ORDER — AMPHETAMINE-DEXTROAMPHETAMINE 10 MG PO TABS: 10.0000 mg | ORAL_TABLET | Freq: Two times a day (BID) | ORAL | 0 refills | Status: DC

## 2020-03-10 MED ORDER — PATADAY 0.2 % OP SOLN: OPHTHALMIC | 3 refills | Status: AC

## 2020-03-10 MED ORDER — LOSARTAN POTASSIUM 50 MG PO TABS: 50.0000 mg | ORAL_TABLET | Freq: Every day | ORAL | 3 refills | Status: AC

## 2020-03-10 NOTE — Addendum Note (Signed)
Addended by: Lamar Blinks C on: 03/10/2020 06:59 PM   Modules accepted: Orders

## 2020-03-11 ENCOUNTER — Encounter: Payer: Self-pay | Admitting: Family Medicine

## 2020-03-14 MED FILL — DOXYCYCLINE HYC 100 MG CAPS: 100 | 30 days supply | Qty: 60 | Fill #1

## 2020-03-14 MED FILL — LOSARTAN POTASSIUM 50 MG TA: 50 | 90 days supply | Qty: 90 | Fill #0

## 2020-04-04 DIAGNOSIS — L821 Other seborrheic keratosis: Secondary | ICD-10-CM | POA: Diagnosis not present

## 2020-04-04 DIAGNOSIS — L738 Other specified follicular disorders: Secondary | ICD-10-CM | POA: Diagnosis not present

## 2020-04-19 MED FILL — CARVEDILOL 3.125 MG TABLET: 3.125 | 90 days supply | Qty: 180 | Fill #1

## 2020-05-03 ENCOUNTER — Other Ambulatory Visit: Payer: Self-pay | Admitting: Family Medicine

## 2020-05-03 DIAGNOSIS — F909 Attention-deficit hyperactivity disorder, unspecified type: Secondary | ICD-10-CM

## 2020-05-03 MED FILL — AMPHETAMINE SALTS 10 MG: 10 | 30 days supply | Qty: 60 | Fill #0

## 2020-05-03 MED FILL — CEPHALEXIN 500 MG CAPSULE: 500 | 30 days supply | Qty: 60 | Fill #1

## 2020-05-31 MED FILL — CEPHALEXIN 500 MG CAPSULE: 500 | 30 days supply | Qty: 60 | Fill #2

## 2020-05-31 MED FILL — MOMETASONE FUROATE 50 MCG S: 50 | 30 days supply | Qty: 17 | Fill #1

## 2020-06-13 ENCOUNTER — Other Ambulatory Visit: Payer: Self-pay

## 2020-06-13 ENCOUNTER — Ambulatory Visit: Payer: 59 | Admitting: Cardiology

## 2020-06-13 ENCOUNTER — Encounter: Payer: Self-pay | Admitting: Cardiology

## 2020-06-13 VITALS — BP 108/72 | HR 77 | Ht 60.5 in | Wt 135.2 lb

## 2020-06-13 DIAGNOSIS — E785 Hyperlipidemia, unspecified: Secondary | ICD-10-CM | POA: Diagnosis not present

## 2020-06-13 DIAGNOSIS — I420 Dilated cardiomyopathy: Secondary | ICD-10-CM

## 2020-06-13 NOTE — Progress Notes (Signed)
Cardiology Office Note:    Date:  06/13/2020   ID:  Tiffany Parker, DOB [birth year], MRN [id number]  PCP:  [name], MD  Cardiologist:  Tiffany Campus, MD    Referring MD: [name], MD   No chief complaint on file. I am doing much better  History of Present Illness:    Tiffany Parker is a 54 y.o. female with past medical history significant for cardiomyopathy ejection fraction 35 to 40%, she was put on appropriate medication, however, we did have difficulty reaching appropriate dosages of this medication because of low blood pressure.  She did have a cardiac catheterization which showed nonobstructive lesions.  She improved with left ventricle ejection fraction, last echocardiogram in November 2020 showing ejection fraction 50 to 55%.  She also got history of dyslipidemia, dyspnea on exertion, history of coronavirus infection.  Comes today to my office for follow-up.  She is back to herself she said she may have a little bit brain fog a little bit shortness of breath with exertion but overall she said that she recovered completely from Covid.  She started exercising on the regular basis and she is enjoying this tremendously.  Past Medical History:  Diagnosis Date  . ADHD (attention deficit hyperactivity disorder)   . Allergy   . Arthritis   . Blood transfusion    post delivery  . Blood transfusion without reported diagnosis   . DVT (deep venous thrombosis) ([place]) 04/2012   following spine surgery - right leg  . Headache(784.0)   . PONV (postoperative nausea and vomiting)   . TMJ (dislocation of temporomandibular joint)     Past Surgical History:  Procedure Laterality Date  . ANTERIOR CERVICAL DECOMP/DISCECTOMY FUSION  05/05/2012   Procedure: ANTERIOR CERVICAL DECOMPRESSION/DISCECTOMY FUSION 1 LEVEL;  Surgeon: Tiffany Hoops, MD;  Location: [facility];  Service: Neurosurgery;  Laterality: N/A;  Cervical four-five anterior cervical decompression with fusion  interbody prothesis plating and bonegraft  . ANTERIOR CERVICAL DECOMP/DISCECTOMY FUSION N/A 05/03/2014   Procedure: CERVICAL FIVE TO SIX, CERVICAL SIX TO SEVEN ANTERIOR CERVICAL DECOMPRESSION/DISCECTOMY FUSION 2 LEVELS;  Surgeon: Tiffany Hoops, MD;  Location: [facility];  Service: Neurosurgery;  Laterality: N/A;  . CESAREAN SECTION    . CHOLECYSTECTOMY    . episiotomy   11/19/97  . episiotomy repair  11/1997  . EYE SURGERY    . LEFT HEART CATH AND CORONARY ANGIOGRAPHY N/A 09/26/2017   Procedure: LEFT HEART CATH AND CORONARY ANGIOGRAPHY;  Surgeon: Tiffany Crome, MD;  Location: [facility];  Service: Cardiovascular;  Laterality: N/A;    Current Medications: Current Meds  Medication Sig  . adapalene (DIFFERIN) 0.1 % gel Apply 1 application topically at bedtime.  Marland Kitchen amphetamine-dextroamphetamine (ADDERALL) 10 MG tablet TAKE 1 TABLET BY MOUTH TWICE DAILY  . aspirin-acetaminophen-caffeine (EXCEDRIN MIGRAINE) 250-250-65 MG per tablet Take 1 tablet by mouth daily as needed for headache.   . B Complex-C (B-COMPLEX WITH VITAMIN C) tablet Take 1 tablet by mouth daily.  Marland Kitchen BIOTIN PO Take 1 tablet by mouth daily.  . Calcium Carbonate-Vitamin D (CALCIUM 600+D) 600-400 MG-UNIT per tablet Take 2 tablets by mouth at bedtime.   . carvedilol (COREG) 3.125 MG tablet TAKE 1 TABLET BY MOUTH 2 TIMES DAILY.  . cephALEXin (KEFLEX) 500 MG capsule Take 500 mg by mouth 2 (two) times daily.  . Cholecalciferol (VITAMIN D) 2000 units CAPS Take 2,000 Units by mouth daily.   . Dapsone 7.5 % GEL Apply  1 application topically daily.  Marland Kitchen doxycycline (PERIOSTAT) 20 MG tablet TAKE 1 TABLET (20 MG TOTAL) BY MOUTH TWICE A DAY  . losartan (COZAAR) 50 MG tablet Take 1 tablet (50 mg total) by mouth daily.  . magnesium oxide (MAG-OX) 400 MG tablet Take 400 mg by mouth at bedtime.   . methocarbamol (ROBAXIN) 500 MG tablet Take 1 tablet (500 mg total) by mouth every 6 (six) hours as needed for muscle spasms.  . mometasone (NASONEX)  50 MCG/ACT nasal spray Place 2 sprays into the nose daily.  . Multiple Vitamin (MULTIVITAMIN WITH MINERALS) TABS Take 1 tablet by mouth daily. Centrum  . Omega-3 Fatty Acids (FISH OIL) 1200 MG CAPS Take 1,200 mg by mouth daily.   Marland Kitchen PATADAY 0.2 % SOLN PLACE 1 DROP IN EACH EYE AS NEEDED FOR DRY/ITCHY EYES OR ALLERGIES  . Probiotic Product (PROBIOTIC PO) Take 1 capsule by mouth daily.     Allergies:   Other, Prenatal vitamins, Septra [bactrim], and Sulfamethoxazole-trimethoprim   Social History   Socioeconomic History  . Marital status: Married    Spouse name: Not on file  . Number of children: Not on file  . Years of education: Not on file  . Highest education level: Not on file  Occupational History  . Occupation: Programmer, multimedia: [employer]  Tobacco Use  . Smoking status: Never Smoker  . Smokeless tobacco: Never Used  Vaping Use  . Vaping Use: Never used  Substance and Sexual Activity  . Alcohol use: Yes    Comment: socially  . Drug use: No  . Sexual activity: Yes  Other Topics Concern  . Not on file  Social History Narrative   Married. Education: [employer]. Exercise: Aerobics 3 days a week for 40 minutes.   Social Determinants of Health   Financial Resource Strain:   . Difficulty of Paying Living Expenses:   Food Insecurity:   . Worried About Charity fundraiser in the Last Year:   . Arboriculturist in the Last Year:   Transportation Needs:   . Film/video editor (Medical):   Marland Kitchen Lack of Transportation (Non-Medical):   Physical Activity:   . Days of Exercise per Week:   . Minutes of Exercise per Session:   Stress:   . Feeling of Stress :   Social Connections:   . Frequency of Communication with Friends and Family:   . Frequency of Social Gatherings with Friends and Family:   . Attends Religious Services:   . Active Member of Clubs or Organizations:   . Attends Archivist Meetings:   Marland Kitchen Marital Status:      Family History: The patient's family  history includes Cancer in her maternal grandfather and mother; Diabetes in her maternal grandmother; Heart disease in her paternal grandmother; Hyperlipidemia in her paternal grandmother; Hypertension in her father and paternal grandmother; Hypothyroidism in her father; Stroke in her maternal grandmother and paternal grandfather. There is no history of Osteoporosis. ROS:   Please see the history of present illness.    All 14 point review of systems negative except as described per history of present illness  EKGs/Labs/Other Studies Reviewed:      Recent Labs: 03/10/2020: ALT 12; BUN 25; Creatinine, Ser 0.62; Hemoglobin 13.5; Platelets 226.0; Potassium 4.9; Sodium 139; TSH 2.13  Recent Lipid Panel    Component Value Date/Time   CHOL 181 03/10/2020 0924   TRIG 103.0 03/10/2020 0924   HDL 61.40 03/10/2020 0924  CHOLHDL 3 03/10/2020 0924   VLDL 20.6 03/10/2020 0924   LDLCALC 99 03/10/2020 0924    Physical Exam:    VS:  BP 108/72   Pulse 77   Ht 5' 0.5" (1.537 m)   Wt 135 lb 3.2 oz (61.3 kg)   SpO2 98%   BMI 25.97 kg/m     Wt Readings from Last 3 Encounters:  06/13/20 135 lb 3.2 oz (61.3 kg)  03/10/20 140 lb (63.5 kg)  09/14/19 144 lb 12.8 oz (65.7 kg)     GEN:  Well nourished, well developed in no acute distress HEENT: Normal NECK: No JVD; No carotid bruits LYMPHATICS: No lymphadenopathy CARDIAC: RRR, no murmurs, no rubs, no gallops RESPIRATORY:  Clear to auscultation without rales, wheezing or rhonchi  ABDOMEN: Soft, non-tender, non-distended MUSCULOSKELETAL:  No edema; No deformity  SKIN: Warm and dry LOWER EXTREMITIES: no swelling NEUROLOGIC:  Alert and oriented x 3 PSYCHIATRIC:  Normal affect   ASSESSMENT:    1. Dilated cardiomyopathy ([place])   2. Dyslipidemia    PLAN:    In order of problems listed above:  1. Dilated cardiomyopathy on small dose of ARB as well as beta-blocker.  We will repeat her echocardiogram in November.  She is complain of having some  shortness of breath but overall seems to be doing well. 2. Dyslipidemia: Her LDL was 101 I however HDL was 70 is good I will continue present management.  I did review her K PN, it show me her cholesterol from Mar 10, 2020 showing LDL is 99 and HDL 61.  We will continue present management. 3. Status post COVID-19 infection.  She is recovering doing well now.   Medication Adjustments/Labs and Tests Ordered: Current medicines are reviewed at length with the patient today.  Concerns regarding medicines are outlined above.  Orders Placed This Encounter  Procedures  . ECHOCARDIOGRAM COMPLETE   Medication changes: No orders of the defined types were placed in this encounter.   Signed, Park Liter, MD, [facility] 06/13/2020 [facility]

## 2020-06-13 NOTE — Patient Instructions (Signed)
Medication Instructions:  °Your physician recommends that you continue on your current medications as directed. Please refer to the Current Medication list given to you today. ° °*If you need a refill on your cardiac medications before your next appointment, please call your pharmacy* ° ° °Lab Work: °None. ° °If you have labs (blood work) drawn today and your tests are completely normal, you will receive your results only by: °• MyChart Message (if you have MyChart) OR °• A paper copy in the mail °If you have any lab test that is abnormal or we need to change your treatment, we will call you to review the results. ° ° °Testing/Procedures: °Your physician has requested that you have an echocardiogram. Echocardiography is a painless test that uses sound waves to create images of your heart. It provides your doctor with information about the size and shape of your heart and how well your heart’s chambers and valves are working. This procedure takes approximately one hour. There are no restrictions for this procedure. ° ° ° ° °Follow-Up: °At [facility], you and your health needs are our priority.  As part of our continuing mission to provide you with exceptional heart care, we have created designated Provider Care Teams.  These Care Teams include your primary Cardiologist (physician) and Advanced Practice Providers (APPs -  Physician Assistants and Nurse Practitioners) who all work together to provide you with the care you need, when you need it. ° °We recommend signing up for the patient portal called "MyChart".  Sign up information is provided on this After Visit Summary.  MyChart is used to connect with patients for Virtual Visits (Telemedicine).  Patients are able to view lab/test results, encounter notes, upcoming appointments, etc.  Non-urgent messages can be sent to your provider as well.   °To learn more about what you can do with MyChart, go to [email or web address].   ° °Your next appointment:   °6  month(s) ° °The format for your next appointment:   °In Person ° °Provider:   °Robert Krasowski, MD ° ° °Other Instructions ° ° °Echocardiogram °An echocardiogram is a procedure that uses painless sound waves (ultrasound) to produce an image of the heart. Images from an echocardiogram can provide important information about: °· Signs of coronary artery disease (CAD). °· Aneurysm detection. An aneurysm is a weak or damaged part of an artery wall that bulges out from the normal force of blood pumping through the body. °· Heart size and shape. Changes in the size or shape of the heart can be associated with certain conditions, including heart failure, aneurysm, and CAD. °· Heart muscle function. °· Heart valve function. °· Signs of a past heart attack. °· Fluid buildup around the heart. °· Thickening of the heart muscle. °· A tumor or infectious growth around the heart valves. °Tell a health care provider about: °· Any allergies you have. °· All medicines you are taking, including vitamins, herbs, eye drops, creams, and over-the-counter medicines. °· Any blood disorders you have. °· Any surgeries you have had. °· Any medical conditions you have. °· Whether you are pregnant or may be pregnant. °What are the risks? °Generally, this is a safe procedure. However, problems may occur, including: °· Allergic reaction to dye (contrast) that may be used during the procedure. °What happens before the procedure? °No specific preparation is needed. You may eat and drink normally. °What happens during the procedure? ° °· An IV tube may be inserted into one of your veins. °· You may   receive contrast through this tube. A contrast is an injection that improves the quality of the pictures from your heart. °· A gel will be applied to your chest. °· A wand-like tool (transducer) will be moved over your chest. The gel will help to transmit the sound waves from the transducer. °· The sound waves will harmlessly bounce off of your heart to  allow the heart images to be captured in real-time motion. The images will be recorded on a computer. °The procedure may vary among health care providers and hospitals. °What happens after the procedure? °· You may return to your normal, everyday life, including diet, activities, and medicines, unless your health care provider tells you not to do that. °Summary °· An echocardiogram is a procedure that uses painless sound waves (ultrasound) to produce an image of the heart. °· Images from an echocardiogram can provide important information about the size and shape of your heart, heart muscle function, heart valve function, and fluid buildup around your heart. °· You do not need to do anything to prepare before this procedure. You may eat and drink normally. °· After the echocardiogram is completed, you may return to your normal, everyday life, unless your health care provider tells you not to do that. °This information is not intended to replace advice given to you by your health care provider. Make sure you discuss any questions you have with your health care provider. °Document Revised: 02/05/2019 Document Reviewed: 11/17/2016 °Elsevier Patient Education © 2020 [employer] ° ° °

## 2020-06-14 ENCOUNTER — Other Ambulatory Visit: Payer: Self-pay | Admitting: Family Medicine

## 2020-06-14 DIAGNOSIS — F909 Attention-deficit hyperactivity disorder, unspecified type: Secondary | ICD-10-CM

## 2020-06-14 MED FILL — LOSARTAN POTASSIUM 50 MG TA: 50 | 90 days supply | Qty: 90 | Fill #1

## 2020-06-14 MED FILL — AMPHETAMINE SALTS 10 MG: 10 | 30 days supply | Qty: 60 | Fill #0

## 2020-06-17 DIAGNOSIS — Z1231 Encounter for screening mammogram for malignant neoplasm of breast: Secondary | ICD-10-CM | POA: Diagnosis not present

## 2020-06-20 DIAGNOSIS — R404 Transient alteration of awareness: Secondary | ICD-10-CM | POA: Diagnosis not present

## 2020-06-20 DIAGNOSIS — E870 Hyperosmolality and hypernatremia: Secondary | ICD-10-CM | POA: Diagnosis not present

## 2020-06-20 DIAGNOSIS — I959 Hypotension, unspecified: Secondary | ICD-10-CM | POA: Diagnosis not present

## 2020-06-20 DIAGNOSIS — R918 Other nonspecific abnormal finding of lung field: Secondary | ICD-10-CM | POA: Diagnosis not present

## 2020-06-20 DIAGNOSIS — E875 Hyperkalemia: Secondary | ICD-10-CM | POA: Diagnosis not present

## 2020-06-20 DIAGNOSIS — S199XXA Unspecified injury of neck, initial encounter: Secondary | ICD-10-CM | POA: Diagnosis not present

## 2020-06-20 DIAGNOSIS — G931 Anoxic brain damage, not elsewhere classified: Secondary | ICD-10-CM | POA: Diagnosis not present

## 2020-06-20 DIAGNOSIS — E872 Acidosis: Secondary | ICD-10-CM | POA: Diagnosis not present

## 2020-06-20 DIAGNOSIS — G936 Cerebral edema: Secondary | ICD-10-CM | POA: Diagnosis not present

## 2020-06-20 DIAGNOSIS — R0689 Other abnormalities of breathing: Secondary | ICD-10-CM | POA: Diagnosis not present

## 2020-06-20 DIAGNOSIS — N179 Acute kidney failure, unspecified: Secondary | ICD-10-CM | POA: Diagnosis not present

## 2020-06-20 DIAGNOSIS — R579 Shock, unspecified: Secondary | ICD-10-CM | POA: Diagnosis not present

## 2020-06-20 DIAGNOSIS — I462 Cardiac arrest due to underlying cardiac condition: Secondary | ICD-10-CM | POA: Diagnosis not present

## 2020-06-20 DIAGNOSIS — R680 Hypothermia, not associated with low environmental temperature: Secondary | ICD-10-CM | POA: Diagnosis not present

## 2020-06-20 DIAGNOSIS — T751XXA Unspecified effects of drowning and nonfatal submersion, initial encounter: Secondary | ICD-10-CM | POA: Diagnosis not present

## 2020-06-20 DIAGNOSIS — Z9911 Dependence on respirator [ventilator] status: Secondary | ICD-10-CM | POA: Diagnosis not present

## 2020-06-20 DIAGNOSIS — I499 Cardiac arrhythmia, unspecified: Secondary | ICD-10-CM | POA: Diagnosis not present

## 2020-06-20 DIAGNOSIS — I469 Cardiac arrest, cause unspecified: Secondary | ICD-10-CM | POA: Diagnosis not present

## 2020-06-20 DIAGNOSIS — J9601 Acute respiratory failure with hypoxia: Secondary | ICD-10-CM | POA: Diagnosis not present

## 2020-06-29 DEATH — deceased

## 2020-08-30 ENCOUNTER — Telehealth: Payer: Self-pay | Admitting: Family Medicine
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
## 2020-09-05 ENCOUNTER — Other Ambulatory Visit: Payer: 59

## 2020-12-19 ENCOUNTER — Ambulatory Visit: Payer: 59 | Admitting: Cardiology
